# Patient Record
Sex: Female | Born: 1945 | Race: White | Hispanic: No | Marital: Married | Smoking: Never smoker
Health system: Southern US, Community
[De-identification: ages and names within clinical notes are randomized; demographics above are authoritative.]

## PROBLEM LIST (undated history)

## (undated) DIAGNOSIS — I10 Essential (primary) hypertension: Secondary | ICD-10-CM

## (undated) DIAGNOSIS — E119 Type 2 diabetes mellitus without complications: Secondary | ICD-10-CM

## (undated) HISTORY — DX: Essential (primary) hypertension: I10

## (undated) HISTORY — DX: Type 2 diabetes mellitus without complications: E11.9

## (undated) HISTORY — PX: REDUCTION MAMMAPLASTY: SUR839

## (undated) HISTORY — PX: BREAST BIOPSY: SHX20

---

## 2016-10-06 HISTORY — PX: BREAST EXCISIONAL BIOPSY: SUR124

## 2017-12-30 DIAGNOSIS — K2100 Gastro-esophageal reflux disease with esophagitis, without bleeding: Secondary | ICD-10-CM | POA: Diagnosis present

## 2019-07-26 DIAGNOSIS — Z95810 Presence of automatic (implantable) cardiac defibrillator: Secondary | ICD-10-CM | POA: Diagnosis present

## 2019-09-14 ENCOUNTER — Other Ambulatory Visit: Payer: Self-pay | Admitting: Internal Medicine

## 2019-09-14 DIAGNOSIS — N6313 Unspecified lump in the right breast, lower outer quadrant: Secondary | ICD-10-CM

## 2019-09-15 ENCOUNTER — Ambulatory Visit
Admission: RE | Admit: 2019-09-15 | Discharge: 2019-09-15 | Disposition: A | Discharge status: Home / Self Care | Payer: Medicare Other | Source: Ambulatory Visit | Attending: Internal Medicine | Admitting: Internal Medicine

## 2019-09-15 DIAGNOSIS — N6313 Unspecified lump in the right breast, lower outer quadrant: Secondary | ICD-10-CM | POA: Diagnosis present

## 2019-10-31 ENCOUNTER — Other Ambulatory Visit: Payer: Self-pay | Admitting: Internal Medicine

## 2019-10-31 DIAGNOSIS — Z1231 Encounter for screening mammogram for malignant neoplasm of breast: Secondary | ICD-10-CM

## 2019-12-30 DIAGNOSIS — Z7901 Long term (current) use of anticoagulants: Secondary | ICD-10-CM

## 2020-01-24 ENCOUNTER — Ambulatory Visit
Admission: RE | Admit: 2020-01-24 | Discharge: 2020-01-24 | Disposition: A | Discharge status: Home / Self Care | Payer: Medicare Other | Source: Ambulatory Visit | Attending: Internal Medicine | Admitting: Internal Medicine

## 2020-01-24 DIAGNOSIS — Z1231 Encounter for screening mammogram for malignant neoplasm of breast: Secondary | ICD-10-CM | POA: Diagnosis not present

## 2020-05-02 ENCOUNTER — Other Ambulatory Visit: Payer: Self-pay | Admitting: Internal Medicine

## 2020-05-02 DIAGNOSIS — E1169 Type 2 diabetes mellitus with other specified complication: Secondary | ICD-10-CM

## 2020-05-02 DIAGNOSIS — R1032 Left lower quadrant pain: Secondary | ICD-10-CM

## 2020-05-04 ENCOUNTER — Ambulatory Visit
Admission: RE | Admit: 2020-05-04 | Discharge: 2020-05-04 | Disposition: A | Discharge status: Home / Self Care | Payer: Medicare Other | Source: Ambulatory Visit | Attending: Internal Medicine | Admitting: Internal Medicine

## 2020-05-04 ENCOUNTER — Other Ambulatory Visit: Payer: Self-pay

## 2020-05-04 DIAGNOSIS — E1169 Type 2 diabetes mellitus with other specified complication: Secondary | ICD-10-CM

## 2020-05-04 DIAGNOSIS — R1032 Left lower quadrant pain: Secondary | ICD-10-CM | POA: Diagnosis present

## 2020-05-04 MED ORDER — IOHEXOL 300 MG/ML  SOLN
100.0000 mL | Freq: Once | INTRAMUSCULAR | Status: AC | PRN
  Administered 2020-05-04: 100 mL via INTRAVENOUS

## 2020-05-08 ENCOUNTER — Observation Stay
Admission: EM | Admit: 2020-05-08 | Discharge: 2020-05-09 | Disposition: A | Discharge status: Home / Self Care | Payer: Medicare Other | Attending: Internal Medicine | Admitting: Internal Medicine

## 2020-05-08 ENCOUNTER — Other Ambulatory Visit: Payer: Self-pay

## 2020-05-08 DIAGNOSIS — Z20822 Contact with and (suspected) exposure to covid-19: Secondary | ICD-10-CM | POA: Diagnosis not present

## 2020-05-08 DIAGNOSIS — Z7901 Long term (current) use of anticoagulants: Secondary | ICD-10-CM

## 2020-05-08 DIAGNOSIS — I4891 Unspecified atrial fibrillation: Secondary | ICD-10-CM | POA: Insufficient documentation

## 2020-05-08 DIAGNOSIS — T82198A Other mechanical complication of other cardiac electronic device, initial encounter: Principal | ICD-10-CM | POA: Insufficient documentation

## 2020-05-08 DIAGNOSIS — K2100 Gastro-esophageal reflux disease with esophagitis, without bleeding: Secondary | ICD-10-CM | POA: Diagnosis present

## 2020-05-08 DIAGNOSIS — E119 Type 2 diabetes mellitus without complications: Secondary | ICD-10-CM | POA: Diagnosis not present

## 2020-05-08 DIAGNOSIS — R531 Weakness: Secondary | ICD-10-CM | POA: Diagnosis not present

## 2020-05-08 DIAGNOSIS — K219 Gastro-esophageal reflux disease without esophagitis: Secondary | ICD-10-CM | POA: Insufficient documentation

## 2020-05-08 DIAGNOSIS — Z95810 Presence of automatic (implantable) cardiac defibrillator: Secondary | ICD-10-CM | POA: Diagnosis present

## 2020-05-08 DIAGNOSIS — Z4502 Encounter for adjustment and management of automatic implantable cardiac defibrillator: Secondary | ICD-10-CM

## 2020-05-08 DIAGNOSIS — Z79899 Other long term (current) drug therapy: Secondary | ICD-10-CM | POA: Diagnosis not present

## 2020-05-08 DIAGNOSIS — I10 Essential (primary) hypertension: Secondary | ICD-10-CM | POA: Diagnosis not present

## 2020-05-08 LAB — COMPREHENSIVE METABOLIC PANEL
ALT: 28 U/L (ref 0–44)
AST: 29 U/L (ref 15–41)
Albumin: 4 g/dL (ref 3.5–5.0)
Alkaline Phosphatase: 92 U/L (ref 38–126)
Anion gap: 12 (ref 5–15)
BUN: 15 mg/dL (ref 8–23)
CO2: 23 mmol/L (ref 22–32)
Calcium: 8.9 mg/dL (ref 8.9–10.3)
Chloride: 104 mmol/L (ref 98–111)
Creatinine, Ser: 0.95 mg/dL (ref 0.44–1.00)
GFR calc Af Amer: >60 mL/min (ref >60)
GFR calc non Af Amer: 59 mL/min — ABNORMAL LOW (ref >60)
Glucose, Bld: 145 mg/dL — ABNORMAL HIGH (ref 70–99)
Potassium: 3.7 mmol/L (ref 3.5–5.1)
Sodium: 139 mmol/L (ref 135–145)
Total Bilirubin: 0.7 mg/dL (ref 0.3–1.2)
Total Protein: 7.7 g/dL (ref 6.5–8.1)

## 2020-05-08 LAB — CBC WITH DIFFERENTIAL/PLATELET
Abs Immature Granulocytes: 0.04 10*3/uL (ref 0.00–0.07)
Basophils Absolute: 0.1 10*3/uL (ref 0.0–0.1)
Basophils Relative: 1 %
Eosinophils Absolute: 0.2 10*3/uL (ref 0.0–0.5)
Eosinophils Relative: 2 %
HCT: 43 % (ref 36.0–46.0)
Hemoglobin: 13.6 g/dL (ref 12.0–15.0)
Immature Granulocytes: 0 %
Lymphocytes Relative: 11 %
Lymphs Abs: 1 10*3/uL (ref 0.7–4.0)
MCH: 26.5 pg (ref 26.0–34.0)
MCHC: 31.6 g/dL (ref 30.0–36.0)
MCV: 83.7 fL (ref 80.0–100.0)
Monocytes Absolute: 0.6 10*3/uL (ref 0.1–1.0)
Monocytes Relative: 7 %
Neutro Abs: 7.2 10*3/uL (ref 1.7–7.7)
Neutrophils Relative %: 79 %
Platelets: 248 10*3/uL (ref 150–400)
RBC: 5.14 MIL/uL — ABNORMAL HIGH (ref 3.87–5.11)
RDW: 16.3 % — ABNORMAL HIGH (ref 11.5–15.5)
WBC: 9.1 10*3/uL (ref 4.0–10.5)
nRBC: 0 % (ref 0.0–0.2)

## 2020-05-08 LAB — URINALYSIS, COMPLETE (UACMP) WITH MICROSCOPIC
Bacteria, UA: NONE SEEN
Bilirubin Urine: NEGATIVE
Glucose, UA: NEGATIVE mg/dL
Hgb urine dipstick: NEGATIVE
Ketones, ur: NEGATIVE mg/dL
Leukocytes,Ua: NEGATIVE
Nitrite: NEGATIVE
Protein, ur: NEGATIVE mg/dL
Specific Gravity, Urine: 1.004 — ABNORMAL LOW (ref 1.005–1.030)
Squamous Epithelial / HPF: NONE SEEN (ref 0–5)
pH: 7 (ref 5.0–8.0)

## 2020-05-08 LAB — MAGNESIUM: Magnesium: 1.9 mg/dL (ref 1.7–2.4)

## 2020-05-08 LAB — TROPONIN I (HIGH SENSITIVITY): Troponin I (High Sensitivity): 74 ng/L — ABNORMAL HIGH (ref <18)

## 2020-05-08 MED ORDER — SODIUM CHLORIDE 0.9 % IV BOLUS
1000.0000 mL | Freq: Once | INTRAVENOUS | Status: AC
  Administered 2020-05-08: 1000 mL via INTRAVENOUS

## 2020-05-08 NOTE — ED Provider Notes (Signed)
[facility] Emergency Department Provider Note  Time seen: 9:10 PM  I have reviewed the triage vital signs and the nursing notes.   HISTORY  Chief Complaint Weakness   HPI Lindsay [name] is a 74 y.o. female with a past medical history of diabetes, hypertension, presents to the emergency department for her defibrillator going off x6.  According to the patient she was traveling home from the vet with her dogs when she began feeling warm.  Patient states her defibrillator then went off 6 times.  Patient states her defibrillator has not gone off since 2018.  Patient follows up with [place] cardiology.  Patient denies any pain currently.  Denies any pain prior to the discharges.  Currently patient appears well, denies any shortness of breath.  Denies any recent illnesses fever cough or congestion.  Patient did recently start Augmentin 5 days ago for diverticulitis.   Past Medical History:  Diagnosis Date  . Diabetes mellitus without complication ([place])   . Hypertension     There are no problems to display for this patient.   Past Surgical History:  Procedure Laterality Date  . BREAST BIOPSY Right    2019-benign  . REDUCTION MAMMAPLASTY Bilateral    2010    Prior to Admission medications   Not on File    Allergies  Allergen Reactions  . Meloxicam Shortness Of Breath  . Beta Adrenergic Blockers     States severe fatigue   . Lotrel [Amlodipine Besy-Benazepril Hcl]     Severe gingival hyperplasia   . Levofloxacin Rash    Family History  Problem Relation Age of Onset  . Breast cancer Mother     Social History Social History   Tobacco Use  . Smoking status: Never Smoker  . Smokeless tobacco: Never Used  Substance Use Topics  . Alcohol use: Never  . Drug use: Never    Review of Systems Constitutional: Negative for fever Cardiovascular: Negative for chest pain currently.  Patient states 6 defibrillator discharges this evening. Respiratory:  Negative for shortness of breath. Gastrointestinal: Negative for abdominal pain.  Did state blood in her stool last week, had a CT scan was diagnosed with diverticulitis has been on antibiotics for 5 days.  Denies any abdominal pain currently. Genitourinary: Negative for urinary compaints Musculoskeletal: Negative for musculoskeletal complaints Neurological: Negative for headache All other ROS negative  ____________________________________________   PHYSICAL EXAM:  VITAL SIGNS: ED Triage Vitals  Enc Vitals Group     BP 05/08/20 2030 (!) 164/79     Pulse Rate 05/08/20 2030 75     Resp 05/08/20 2030 (!) 25     Temp 05/08/20 2030 98 F (36.7 C)     Temp Source 05/08/20 2030 Oral     SpO2 05/08/20 2030 98 %     Weight 05/08/20 2026 189 lb (85.7 kg)     Height 05/08/20 2026 5\' 5"  (1.651 m)     Head Circumference --      Peak Flow --      Pain Score 05/08/20 2026 0     Pain Loc --      Pain Edu? --      Excl. in [place]? --     Constitutional: Alert and oriented. Well appearing and in no distress. Eyes: Normal exam ENT      Head: Normocephalic and atraumatic.      Mouth/Throat: Mucous membranes are moist. Cardiovascular: Normal rate, regular rhythm.  Respiratory: Normal respiratory effort without tachypnea nor retractions.  Breath sounds are clear Gastrointestinal: Soft and nontender. No distention. Musculoskeletal: Nontender with normal range of motion in all extremities.  Neurologic:  Normal speech and language. No gross focal neurologic deficits  Skin:  Skin is warm, dry and intact.  Psychiatric: Mood and affect are normal.  ____________________________________________    EKG  EKG viewed and interpreted by myself shows a sinus rhythm at 70 bpm.  Widened QRS, normal axis, slight QTC prolongation.  Occasional PVCs.  No significant ST changes noted.  ____________________________________________   INITIAL IMPRESSION / ASSESSMENT AND PLAN / ED COURSE  Pertinent labs &  imaging results that were available during my care of the patient were reviewed by me and considered in my medical decision making (see chart for details).   Patient presents to the emergency department after defibrillator discharge 6 times this evening.  Overall patient appears well, no distress currently.  Patient appears to be in a sinus rhythm at this time.  We will check labs including potassium, magnesium.  We will IV hydrate.  We will interrogate the patient's defibrillator and continue to closely monitor.  Patient agreeable to plan of care.  Lindsay [name] was evaluated in Emergency Department on 05/08/2020 for the symptoms described in the history of present illness. She was evaluated in the context of the global COVID-19 pandemic, which necessitated consideration that the patient might be at risk for infection with the SARS-CoV-2 virus that causes COVID-19. Institutional protocols and algorithms that pertain to the evaluation of patients at risk for COVID-19 are in a state of rapid change based on information released by regulatory bodies including the CDC and federal and state organizations. These policies and algorithms were followed during the patient's care in the ED.  ____________________________________________   FINAL CLINICAL IMPRESSION(S) / ED DIAGNOSES  Defibrillator discharge   Harvest Dark, MD 05/09/20 [phone]

## 2020-05-08 NOTE — ED Triage Notes (Signed)
Pt arrives [facility] from home w cc of generalized weakness starting at 6:30pm after defibrillator went off 6 times. Settings meant to go off for HR >180. Pt also has pacemaker. Pt denies pain, shob, n/v/d. PT HR 180s to 210s upon ems arrival. 18G R AC given 10mg  total cardizem IV.  Hx afib

## 2020-05-08 NOTE — ED Notes (Signed)
Pt taking tikosyn 257mcg, metoprolol 25 mg, and losartan 25mg  from home supply. Okay per MD Paduchowski. Pt med list at bedside.

## 2020-05-09 ENCOUNTER — Encounter: Payer: Self-pay | Admitting: Internal Medicine

## 2020-05-09 DIAGNOSIS — E785 Hyperlipidemia, unspecified: Secondary | ICD-10-CM

## 2020-05-09 DIAGNOSIS — Z95810 Presence of automatic (implantable) cardiac defibrillator: Secondary | ICD-10-CM | POA: Diagnosis not present

## 2020-05-09 DIAGNOSIS — I4891 Unspecified atrial fibrillation: Secondary | ICD-10-CM | POA: Diagnosis not present

## 2020-05-09 DIAGNOSIS — I10 Essential (primary) hypertension: Secondary | ICD-10-CM | POA: Diagnosis not present

## 2020-05-09 DIAGNOSIS — Z4502 Encounter for adjustment and management of automatic implantable cardiac defibrillator: Secondary | ICD-10-CM

## 2020-05-09 DIAGNOSIS — T82198A Other mechanical complication of other cardiac electronic device, initial encounter: Secondary | ICD-10-CM | POA: Diagnosis not present

## 2020-05-09 LAB — BASIC METABOLIC PANEL
Anion gap: 7 (ref 5–15)
BUN: 13 mg/dL (ref 8–23)
CO2: 27 mmol/L (ref 22–32)
Calcium: 8.4 mg/dL — ABNORMAL LOW (ref 8.9–10.3)
Chloride: 106 mmol/L (ref 98–111)
Creatinine, Ser: 0.91 mg/dL (ref 0.44–1.00)
GFR calc Af Amer: >60 mL/min (ref >60)
GFR calc non Af Amer: >60 mL/min (ref >60)
Glucose, Bld: 179 mg/dL — ABNORMAL HIGH (ref 70–99)
Potassium: 4 mmol/L (ref 3.5–5.1)
Sodium: 140 mmol/L (ref 135–145)

## 2020-05-09 LAB — TROPONIN I (HIGH SENSITIVITY): Troponin I (High Sensitivity): 216 ng/L (ref <18)

## 2020-05-09 LAB — MAGNESIUM: Magnesium: 2 mg/dL (ref 1.7–2.4)

## 2020-05-09 LAB — SARS CORONAVIRUS 2 BY RT PCR (HOSPITAL ORDER, PERFORMED IN ~~LOC~~ HOSPITAL LAB): SARS Coronavirus 2: NEGATIVE

## 2020-05-09 MED ORDER — AMOXICILLIN-POT CLAVULANATE 875-125 MG PO TABS
1.0000 | ORAL_TABLET | Freq: Two times a day (BID) | ORAL | Status: DC
  Administered 2020-05-09: 1 via ORAL
  Filled 2020-05-09 (×2): qty 1

## 2020-05-09 MED ORDER — METOPROLOL TARTRATE 50 MG PO TABS
50.0000 mg | ORAL_TABLET | Freq: Two times a day (BID) | ORAL | Status: DC
  Administered 2020-05-09: 50 mg via ORAL
  Filled 2020-05-09: qty 1

## 2020-05-09 MED ORDER — ACETAMINOPHEN 325 MG PO TABS: 650.0000 mg | ORAL_TABLET | Freq: Four times a day (QID) | ORAL | Status: DC | PRN

## 2020-05-09 MED ORDER — METOPROLOL TARTRATE 50 MG PO TABS: 50.0000 mg | ORAL_TABLET | Freq: Three times a day (TID) | ORAL | 0 refills | Status: DC

## 2020-05-09 MED ORDER — METOPROLOL TARTRATE 50 MG PO TABS
50.0000 mg | ORAL_TABLET | Freq: Three times a day (TID) | ORAL | Status: DC
  Administered 2020-05-09: 50 mg via ORAL
  Filled 2020-05-09: qty 1

## 2020-05-09 MED ORDER — DOFETILIDE 250 MCG PO CAPS
250.0000 ug | ORAL_CAPSULE | Freq: Two times a day (BID) | ORAL | Status: DC
  Administered 2020-05-09: 250 ug via ORAL
  Filled 2020-05-09 (×2): qty 1

## 2020-05-09 MED ORDER — LOSARTAN POTASSIUM 50 MG PO TABS
50.0000 mg | ORAL_TABLET | Freq: Two times a day (BID) | ORAL | Status: DC
  Administered 2020-05-09: 50 mg via ORAL
  Filled 2020-05-09: qty 1

## 2020-05-09 MED ORDER — MAGNESIUM OXIDE 400 (241.3 MG) MG PO TABS: 400.0000 mg | ORAL_TABLET | Freq: Two times a day (BID) | ORAL | Status: DC

## 2020-05-09 MED ORDER — MAGNESIUM OXIDE 400 (241.3 MG) MG PO TABS: 800.0000 mg | ORAL_TABLET | Freq: Once | ORAL | Status: DC

## 2020-05-09 MED ORDER — ONDANSETRON HCL 4 MG/2ML IJ SOLN: 4.0000 mg | Freq: Four times a day (QID) | INTRAMUSCULAR | Status: DC | PRN

## 2020-05-09 MED ORDER — ROSUVASTATIN CALCIUM 10 MG PO TABS
10.0000 mg | ORAL_TABLET | Freq: Every day | ORAL | Status: DC
  Administered 2020-05-09: 10 mg via ORAL

## 2020-05-09 MED ORDER — ONDANSETRON HCL 4 MG PO TABS: 4.0000 mg | ORAL_TABLET | Freq: Four times a day (QID) | ORAL | Status: DC | PRN

## 2020-05-09 MED ORDER — ACETAMINOPHEN 650 MG RE SUPP: 650.0000 mg | Freq: Four times a day (QID) | RECTAL | Status: DC | PRN

## 2020-05-09 MED ORDER — RIVAROXABAN 20 MG PO TABS: 20.0000 mg | ORAL_TABLET | Freq: Every day | ORAL | Status: DC

## 2020-05-09 NOTE — ED Provider Notes (Signed)
-----------------------------------------   12:06 AM on 05/09/2020 -----------------------------------------  Assuming care from Dr. Kerman Passey.  In short, Lindsay [name] is a 74 y.o. female with a chief complaint of pacemaker firing.  Refer to the original H&P for additional details.  Patient will be admitted by hospitalist at [facility]  However, we are awaiting a callback from [facility] for recommendations.  Need recommendations given that she received 6 shocks but the pacemaker cannot be interrogated.  Patient states that at home she was able to submit the interrogation.  Patient is already on Tikosyn.     ----------------------------------------- 12:55 AM on 05/09/2020 -----------------------------------------   (Note that documentation was delayed due to multiple ED patients requiring immediate care.)   Dr. Kerman Passey discussed the case with the patient and with Dr. Damita Dunnings with the [facility] hospitalist service.  The patient will be staying here overnight.  I spoke by phone with Dr. Rosemarie Ax with [facility] cardiology.  He understands the situation and advised monitoring overnight, continuing her regular medications, and the use of beta-blockers and/or calcium channel blockers as needed to keep her heart rate appropriate.  Right now she is on the cardiac monitor with a heart rate of about 70.  I discussed the case again with Dr. Damita Dunnings and she will admit.   Hinda Kehr, MD 05/09/20 [phone]

## 2020-05-09 NOTE — Plan of Care (Signed)
Patient refusing bed alarm, educated multiple times on the importance of calling before getting up to the bathroom since her ICD fired and we would like to prevent her from falling if her ICD were to fire again.  During shift change report, pt stated she had her own meds in her room, and that she will take her own meds if we do not bring in her meds at the times she takes it. Refused to have meds sent to pharmacy. Pt also made aware that per policy she can not take her own meds because if something were to happen we would need to know what meds she's taken but we will bring meds at the times she want them. Pt adamant about sending meds to pharmacy. Charge RN aware.  Problem: Activity: Goal: Risk for activity intolerance will decrease Outcome: Not Progressing   Problem: Education: Goal: Knowledge of cardiac device and self-care will improve Outcome: Progressing Goal: Ability to safely manage health related needs after discharge will improve Outcome: Progressing Goal: Individualized Educational Video(s) Outcome: Progressing

## 2020-05-09 NOTE — H&P (Addendum)
History and Physical    Lindsay [name] GDJ:[id number] DOB: [birth year] DOA: 05/08/2020  PCP: Ezequiel Kayser, MD   Patient coming from: home  I have personally briefly reviewed patient's old medical records in [place]  Chief Complaint: Defibrillator discharge  HPI: Lindsay [name] is a 74 y.o. female with medical history significant for A. fib status post ablation in 2017, on Xarelto , history of atrial tachycardia status post ICD, currently on dofetilide, status post dosage reduction in August 2020 due to prolonged QT concerns, followed by electrophysiologist, Dr. Mylinda Latina at [facility], who presents to the emergency room following defibrillator discharge x6.  Patient states her defibrillator is meant to go off her heart rate over 180.  On arrival of EMS heart rate was in the 180s to 210s.  She received Cardizem 10 mg IV with EMS.  Patient had previously been in her usual state of health except for recent acute diverticulitis for which she had been on Augmentin in the previous 5 days. ED Course: By arrival, patient  felt weak but denied chest pains or shortness of breath.  EKG showed sinus rhythm in the 70s with frequent PVCs.  Blood work showed troponin 74.  Magnesium 1.9, potassium 3.7.  The emergency room provider spoke with patient's cardiologist at [facility] who recommended overnight observation with as needed metoprolol or diltiazem for tachyarrhythmias.  Patient initially requested transfer to [facility] but was agreeable to staying for overnight observation.  Hospitalist consulted for admission.  Review of Systems: As per HPI otherwise all other systems on review of systems negative.    Past Medical History:  Diagnosis Date  . Diabetes mellitus without complication ([place])   . Hypertension     Past Surgical History:  Procedure Laterality Date  . BREAST BIOPSY Right    2019-benign  . REDUCTION MAMMAPLASTY Bilateral    2010     reports that she has never smoked. She has never used smokeless  tobacco. She reports that she does not drink alcohol and does not use drugs.  Allergies  Allergen Reactions  . Meloxicam Shortness Of Breath  . Beta Adrenergic Blockers     States severe fatigue   . Lotrel [Amlodipine Besy-Benazepril Hcl]     Severe gingival hyperplasia   . Levofloxacin Rash    Family History  Problem Relation Age of Onset  . Breast cancer Mother       Prior to Admission medications   Medication Sig Start Date End Date Taking? Authorizing Provider  beta carotene w/minerals (OCUVITE) tablet Take 1 tablet by mouth daily. 01/26/20  Yes [provider]  dofetilide (TIKOSYN) 250 MCG capsule Take 250 mcg by mouth 2 (two) times daily. 03/05/20  Yes [provider]  losartan (COZAAR) 50 MG tablet Take 50 mg by mouth 2 (two) times daily. 04/30/20  Yes [provider]  MAGNESIUM OXIDE PO Take 165 mg by mouth daily.   Yes [provider]  metoprolol tartrate (LOPRESSOR) 50 MG tablet Take 50 mg by mouth 2 (two) times daily. 04/30/20  Yes [provider]  Multiple Vitamins tablet Take 1 tablet by mouth daily. 01/26/20  Yes [provider]  NEXIUM 20 MG capsule Take 1 capsule by mouth daily as needed. 01/26/20  Yes [provider]  rosuvastatin (CRESTOR) 10 MG tablet Take 10 mg by mouth at bedtime. 04/28/20  Yes [provider]  XARELTO 20 MG TABS tablet Take 20 mg by mouth daily. 04/28/20  Yes [provider]  Physical Exam: Vitals:   05/08/20 2130 05/08/20 2318 05/08/20 2330 05/09/20 0006  BP: (!) 177/80 (!) 152/66 (!) 163/56 (!) 169/67  Pulse: 70 70 70 70  Resp: (!) 24 14 19  (!) 21  Temp:      TempSrc:      SpO2: 100% 98% 98% 97%  Weight:      Height:         Vitals:   05/08/20 2130 05/08/20 2318 05/08/20 2330 05/09/20 0006  BP: (!) 177/80 (!) 152/66 (!) 163/56 (!) 169/67  Pulse: 70 70 70 70  Resp: (!) 24 14 19  (!) 21  Temp:      TempSrc:      SpO2: 100% 98% 98% 97%  Weight:        Height:          Constitutional: Alert and oriented x 3 . Not in any apparent distress HEENT:      Head: Normocephalic and atraumatic.         Eyes: PERLA, EOMI, Conjunctivae are normal. Sclera is non-icteric.       Mouth/Throat: Mucous membranes are moist.       Neck: Supple with no signs of meningismus. Cardiovascular: Regular rate and rhythm. No murmurs, gallops, or rubs. 2+ symmetrical distal pulses are present . No JVD. No LE edema Respiratory: Respiratory effort normal .Lungs sounds clear bilaterally. No wheezes, crackles, or rhonchi.  Gastrointestinal: Soft, non tender, and non distended with positive bowel sounds. No rebound or guarding. Genitourinary: No CVA tenderness. Musculoskeletal: Nontender with normal range of motion in all extremities. No cyanosis, or erythema of extremities. Neurologic: Normal speech and language. Face is symmetric. Moving all extremities. No gross focal neurologic deficits . Skin: Skin is warm, dry.  No rash or ulcers Psychiatric: Mood and affect are normal Speech and behavior are normal   Labs on Admission: I have personally reviewed following labs and imaging studies  CBC: Recent Labs  Lab 05/08/20 2040  WBC 9.1  NEUTROABS 7.2  HGB 13.6  HCT 43.0  MCV 83.7  PLT 096   Basic Metabolic Panel: Recent Labs  Lab 05/08/20 2040  NA 139  K 3.7  CL 104  CO2 23  GLUCOSE 145*  BUN 15  CREATININE 0.95  CALCIUM 8.9  MG 1.9   GFR: Estimated Creatinine Clearance: 56.2 mL/min (by C-G formula based on SCr of 0.95 mg/dL). Liver Function Tests: Recent Labs  Lab 05/08/20 2040  AST 29  ALT 28  ALKPHOS 92  BILITOT 0.7  PROT 7.7  ALBUMIN 4.0   No results for input(s): LIPASE, AMYLASE in the last 168 hours. No results for input(s): AMMONIA in the last 168 hours. Coagulation Profile: No results for input(s): INR, PROTIME in the last 168 hours. Cardiac Enzymes: No results for input(s): CKTOTAL, CKMB, CKMBINDEX, TROPONINI in the last 168  hours. BNP (last 3 results) No results for input(s): PROBNP in the last 8760 hours. HbA1C: No results for input(s): HGBA1C in the last 72 hours. CBG: No results for input(s): GLUCAP in the last 168 hours. Lipid Profile: No results for input(s): CHOL, HDL, LDLCALC, TRIG, CHOLHDL, LDLDIRECT in the last 72 hours. Thyroid Function Tests: No results for input(s): TSH, T4TOTAL, FREET4, T3FREE, THYROIDAB in the last 72 hours. Anemia Panel: No results for input(s): VITAMINB12, FOLATE, FERRITIN, TIBC, IRON, RETICCTPCT in the last 72 hours. Urine analysis:    Component Value Date/Time   COLORURINE COLORLESS (A) 05/08/2020 2041   APPEARANCEUR CLEAR (A) 05/08/2020 2041  LABSPEC 1.004 (L) 05/08/2020 2041   PHURINE 7.0 05/08/2020 2041   GLUCOSEU NEGATIVE 05/08/2020 2041   HGBUR NEGATIVE 05/08/2020 2041   [place] NEGATIVE 05/08/2020 2041   [place] NEGATIVE 05/08/2020 2041   PROTEINUR NEGATIVE 05/08/2020 2041   NITRITE NEGATIVE 05/08/2020 2041   LEUKOCYTESUR NEGATIVE 05/08/2020 2041    Radiological Exams on Admission: No results found.  EKG: Independently reviewed. Interpretation : Sinus rhythm rate of 70 with PVCs  Assessment/Plan 74 year old female with a history of A. fib status post ablation in 2017, on Xarelto , history of atrial tachycardia status post ICD, currently on dofetilide, status post dosage reduction in August 2020 due to prolonged QT concerns, followed by electrophysiologist, Dr. Mylinda Latina at [facility], who presents to the emergency room following defibrillator discharge x6    Defibrillator discharge   ICD (implantable cardioverter-defibrillator), dual, in situ -Heart rate 180s to 210s with EMS treated with Cardizem bolus x1 -Heart rate remained in sinus in the 70s with PVCs since arrival in the emergency room -Unable to interrogate ICD from the emergency room, reason uncertain -The emergency room provider spoke with on-call electrophysiologist at [facility] who advised to  monitor patient overnight with as needed metoprolol or diltiazem for tachyarrhythmia -Continue home dofetilide and metoprolol -Keep magnesium over 2, potassium around 4    Atrial fibrillation ([place]) with history of ablation in 2017   Anticoagulated -Continue rate control agents above, dofetilide and metoprolol -Continue Xarelto for CHA2DS2-VASc score of 4 -   Gastro-esophageal reflux disease  -Continue home Nexium    DVT prophylaxis: Xarelto Code Status: full code  Family Communication:  none  Disposition Plan: Back to previous home environment Consults called: Cardiology Status: Observation    Athena Masse MD [facility]     05/09/2020, 1:12 AM

## 2020-05-09 NOTE — Progress Notes (Signed)
Lindsay [name] to be D/C'd Home per MD order.  Discussed prescriptions and follow up appointments with the patient. Prescriptions given to patient, medication list explained in detail. Pt verbalized understanding.  Allergies as of 05/09/2020      Reactions   Meloxicam Shortness Of Breath   Beta Adrenergic Blockers    States severe fatigue    Lotrel [amlodipine Besy-benazepril Hcl]    Severe gingival hyperplasia    Levofloxacin Rash      Medication List    TAKE these medications   amoxicillin-clavulanate 875-125 MG tablet Commonly known as: AUGMENTIN Take 1 tablet by mouth 2 (two) times daily.   beta carotene w/minerals tablet Take 1 tablet by mouth daily.   dofetilide 250 MCG capsule Commonly known as: TIKOSYN Take 250 mcg by mouth 2 (two) times daily.   losartan 50 MG tablet Commonly known as: COZAAR Take 50 mg by mouth 2 (two) times daily.   MAGNESIUM OXIDE PO Take 165 mg by mouth daily.   metoprolol tartrate 50 MG tablet Commonly known as: LOPRESSOR Take 1 tablet (50 mg total) by mouth 3 (three) times daily. What changed: when to take this   Multiple Vitamins tablet Take 1 tablet by mouth daily.   NexIUM 20 MG capsule Generic drug: esomeprazole Take 1 capsule by mouth daily as needed.   rosuvastatin 10 MG tablet Commonly known as: CRESTOR Take 10 mg by mouth at bedtime.   Xarelto 20 MG Tabs tablet Generic drug: rivaroxaban Take 20 mg by mouth daily.       Vitals:   05/09/20 0934 05/09/20 1151  BP:  (!) 146/65  Pulse:  70  Resp:    Temp:  98.1 F (36.7 C)  SpO2: 97% 97%    Tele box removed and returned. Skin clean, dry and intact without evidence of skin break down, no evidence of skin tears noted. IV catheter discontinued intact. Site without signs and symptoms of complications. Dressing and pressure applied. Pt denies pain at this time. No complaints noted.  An After Visit Summary was printed and given to the patient. Patient escorted via [place], and  D/C home via private auto.  Lindsay [name]

## 2020-05-09 NOTE — Discharge Summary (Signed)
Physician Discharge Summary  Lindsay [name] ZTI:[id number] DOB: 01/01/1946 DOA: 05/08/2020  PCP: Ezequiel Kayser, MD  Admit date: 05/08/2020 Discharge date: 05/09/2020  Discharge disposition: Home   Recommendations for Outpatient Follow-Up:   Cardiologist, Dr. Mylinda Latina on May 15, 2020    Discharge Diagnosis:   Principal Problem:   Defibrillator discharge Active Problems:   Atrial fibrillation ([place])   Anticoagulated   Gastro-esophageal reflux disease with esophagitis   ICD (implantable cardioverter-defibrillator), dual, in situ    Discharge Condition: Stable.  Diet recommendation:  Diet Order            Diet Heart Room service appropriate? Yes; Fluid consistency: Thin  Diet effective now           Diet - low sodium heart healthy                   Code Status: Full Code     Hospital Course:   Ms. Lindsay [name] is a 74 y.o. female with medical history significant for recent acute diverticulitis on Augmentin, A. fib status post ablation in 2017, on Xarelto , history of atrial tachycardia status post ICD, currently on dofetilide, status post dosage reduction in August 2020 due to prolonged QT concerns.  She was followed by electrophysiologist, Dr. Mylinda Latina at [facility].  Patient emergency room after she had AICD discharge x6.  She was admitted to progressive cardiac unit for observation.  AICD interrogation showed that atrial tachycardia and nonsustained ventricular tachycardia were the cause of the shocks.  She was evaluated by the cardiologist who recommended increasing metoprolol from 50 mg twice daily to 3 times daily.  There were no further ICD shocks understanding.  She is asymptomatic and she is deemed stable for discharge.  Discharge plan was discussed with the patient and she verbalized understanding and agreed with the plan.       Discharge Exam:   Vitals:   05/09/20 0934 05/09/20 1151  BP:  (!) 146/65  Pulse:  70  Resp:    Temp:  98.1 F (36.7 C)   SpO2: 97% 97%   Vitals:   05/09/20 0443 05/09/20 0744 05/09/20 0934 05/09/20 1151  BP: (!) 158/68 (!) 168/72  (!) 146/65  Pulse: 69 70  70  Resp:      Temp: 97.7 F (36.5 C) 97.8 F (36.6 C)  98.1 F (36.7 C)  TempSrc: Oral Oral  Oral  SpO2: 98% 98% 97% 97%  Weight: 84.3 kg     Height: 5\' 5"  (1.651 m)        GEN: NAD SKIN: No rash EYES: EOMI ENT: MMM CV: RRR PULM: CTA B ABD: soft, obese, NT, +BS CNS: AAO x 3, non focal EXT: No edema or tenderness   The results of significant diagnostics from this hospitalization (including imaging, microbiology, ancillary and laboratory) are listed below for reference.     Procedures and Diagnostic Studies:   No results found.   Labs:   Basic Metabolic Panel: Recent Labs  Lab 05/08/20 2040 05/09/20 0233  NA 139 140  K 3.7 4.0  CL 104 106  CO2 23 27  GLUCOSE 145* 179*  BUN 15 13  CREATININE 0.95 0.91  CALCIUM 8.9 8.4*  MG 1.9 2.0   GFR Estimated Creatinine Clearance: 58.1 mL/min (by C-G formula based on SCr of 0.91 mg/dL). Liver Function Tests: Recent Labs  Lab 05/08/20 2040  AST 29  ALT 28  ALKPHOS 92  BILITOT 0.7  PROT 7.7  ALBUMIN  4.0   No results for input(s): LIPASE, AMYLASE in the last 168 hours. No results for input(s): AMMONIA in the last 168 hours. Coagulation profile No results for input(s): INR, PROTIME in the last 168 hours.  CBC: Recent Labs  Lab 05/08/20 2040  WBC 9.1  NEUTROABS 7.2  HGB 13.6  HCT 43.0  MCV 83.7  PLT 248   Cardiac Enzymes: No results for input(s): CKTOTAL, CKMB, CKMBINDEX, TROPONINI in the last 168 hours. BNP: Invalid input(s): POCBNP CBG: No results for input(s): GLUCAP in the last 168 hours. D-Dimer No results for input(s): DDIMER in the last 72 hours. Hgb A1c No results for input(s): HGBA1C in the last 72 hours. Lipid Profile No results for input(s): CHOL, HDL, LDLCALC, TRIG, CHOLHDL, LDLDIRECT in the last 72 hours. Thyroid function studies No results  for input(s): TSH, T4TOTAL, T3FREE, THYROIDAB in the last 72 hours.  Invalid input(s): FREET3 Anemia work up No results for input(s): VITAMINB12, FOLATE, FERRITIN, TIBC, IRON, RETICCTPCT in the last 72 hours. Microbiology Recent Results (from the past 240 hour(s))  SARS Coronavirus 2 by RT PCR (hospital order, performed in [facility] hospital lab) Nasopharyngeal Nasopharyngeal Swab     Status: None   Collection Time: 05/09/20 12:15 AM   Specimen: Nasopharyngeal Swab  Result Value Ref Range Status   SARS Coronavirus 2 NEGATIVE NEGATIVE Final    Comment: (NOTE) SARS-CoV-2 target nucleic acids are NOT DETECTED.  The SARS-CoV-2 RNA is generally detectable in upper and lower respiratory specimens during the acute phase of infection. The lowest concentration of SARS-CoV-2 viral copies this assay can detect is 250 copies / mL. A negative result does not preclude SARS-CoV-2 infection and should not be used as the sole basis for treatment or other patient management decisions.  A negative result may occur with improper specimen collection / handling, submission of specimen other than nasopharyngeal swab, presence of viral mutation(s) within the areas targeted by this assay, and inadequate number of viral copies (<250 copies / mL). A negative result must be combined with clinical observations, patient history, and epidemiological information.  Fact Sheet for Patients:   StrictlyIdeas.no  Fact Sheet for Healthcare Providers: BankingDealers.co.za  This test is not yet approved or  cleared by the [place] FDA and has been authorized for detection and/or diagnosis of SARS-CoV-2 by FDA under an Emergency Use Authorization (EUA).  This EUA will remain in effect (meaning this test can be used) for the duration of the COVID-19 declaration under Section 564(b)(1) of the Act, 21 U.S.C. section 360bbb-3(b)(1), unless the authorization is terminated  or revoked sooner.  Performed at [facility], [address] [place], [place] [address]      Discharge Instructions:   Discharge Instructions    Diet - low sodium heart healthy   Complete by: As directed    Increase activity slowly   Complete by: As directed      Allergies as of 05/09/2020      Reactions   Meloxicam Shortness Of Breath   Beta Adrenergic Blockers    States severe fatigue    Lotrel [amlodipine Besy-benazepril Hcl]    Severe gingival hyperplasia    Levofloxacin Rash      Medication List    TAKE these medications   amoxicillin-clavulanate 875-125 MG tablet Commonly known as: AUGMENTIN Take 1 tablet by mouth 2 (two) times daily.   beta carotene w/minerals tablet Take 1 tablet by mouth daily.   dofetilide 250 MCG capsule Commonly known as: TIKOSYN Take  250 mcg by mouth 2 (two) times daily.   losartan 50 MG tablet Commonly known as: COZAAR Take 50 mg by mouth 2 (two) times daily.   MAGNESIUM OXIDE PO Take 165 mg by mouth daily.   metoprolol tartrate 50 MG tablet Commonly known as: LOPRESSOR Take 1 tablet (50 mg total) by mouth 3 (three) times daily. What changed: when to take this   Multiple Vitamins tablet Take 1 tablet by mouth daily.   NexIUM 20 MG capsule Generic drug: esomeprazole Take 1 capsule by mouth daily as needed.   rosuvastatin 10 MG tablet Commonly known as: CRESTOR Take 10 mg by mouth at bedtime.   Xarelto 20 MG Tabs tablet Generic drug: rivaroxaban Take 20 mg by mouth daily.       Follow-up Information    Marzetta Board, MD. Go on 05/15/2020.   Specialty: Cardiology Contact information: [facility] [address] [place] [address] [phone]                Time coordinating discharge: 31 minutes  Signed:  Jennye Boroughs  [facility] 05/09/2020, 2:16 PM

## 2020-05-09 NOTE — Consult Note (Signed)
Cardiology Consultation:   Patient ID: Lindsay [name] MRN: [id number]; DOB: 1945/12/13  Admit date: 05/08/2020 Date of Consult: 05/09/2020  Primary Care Provider: Ezequiel Kayser, MD [facility] Cardiologist: [facility] Electrophysiologist:  [facility]   Patient Profile:   Lindsay [name] is a 74 y.o. female with a hx of hypertension, diabetes, HCM status post AICD, A. fib status post ablation who is being seen today for the evaluation of arrhythmias, ICD shock at the request of Dr. Damita Dunnings.  History of Present Illness:   [name] patient is a 74 year old female with history of HCM status post ICD for primary prevention, paroxysmal atrial fibrillation on Xarelto, status post cryoablation in 2017, hypertension who presents due to ICD shocks.  Patient was holding her dog when she fell her heart rate beating fast, felt warm and her device shocked her 6 times.  She called EMS who evaluated her roughly 10 to 15 minutes later.  Patient was noted to be in atrial tachycardia upon EMS arrival.  She was given IV diltiazem with improvement in heart rates en route to the emergency department.  She states having similar symptoms in 2018 due to atrial tachycardia, was started on Tikosyn 500 twice daily.  She had QT prolongation in December 2020 and Tikosyn dose was reduced to 250 mg twice daily.  Last ejection fraction in June 2020 was 60%.  Patient has a history of gingivitis while on calcium channel blocker/amlodipine.  She otherwise was doing okay, denies shortness of breath or chest pain, apart from the ICD shocks which were very painful.    The emergency department discussed with [facility] cardiology who recommended calcium channel blockers and beta-blockers to help with patient's heart rate.  Device was interrogated in the ED showing atrial tachycardia, nonsustained VT.  EKG showed sinus rhythm with PVCs.  Patient called primary cardiologist this morning and has appointment scheduled in 6  days.   Past Medical History:  Diagnosis Date  . Diabetes mellitus without complication ([place])   . Hypertension     Past Surgical History:  Procedure Laterality Date  . BREAST BIOPSY Right    2019-benign  . REDUCTION MAMMAPLASTY Bilateral    2010     Home Medications:  Prior to Admission medications   Medication Sig Start Date End Date Taking? Authorizing Provider  amoxicillin-clavulanate (AUGMENTIN) 875-125 MG tablet Take 1 tablet by mouth 2 (two) times daily.   Yes [provider]  beta carotene w/minerals (OCUVITE) tablet Take 1 tablet by mouth daily. 01/26/20  Yes [provider]  dofetilide (TIKOSYN) 250 MCG capsule Take 250 mcg by mouth 2 (two) times daily. 03/05/20  Yes [provider]  losartan (COZAAR) 50 MG tablet Take 50 mg by mouth 2 (two) times daily. 04/30/20  Yes [provider]  MAGNESIUM OXIDE PO Take 165 mg by mouth daily.   Yes [provider]  metoprolol tartrate (LOPRESSOR) 50 MG tablet Take 50 mg by mouth 2 (two) times daily. 04/30/20  Yes [provider]  Multiple Vitamins tablet Take 1 tablet by mouth daily. 01/26/20  Yes [provider]  NEXIUM 20 MG capsule Take 1 capsule by mouth daily as needed. 01/26/20  Yes [provider]  rosuvastatin (CRESTOR) 10 MG tablet Take 10 mg by mouth at bedtime. 04/28/20  Yes [provider]  XARELTO 20 MG TABS tablet Take 20 mg by mouth daily. 04/28/20  Yes [provider]    Inpatient Medications: Scheduled Meds: . amoxicillin-clavulanate  1 tablet  Oral BID  . dofetilide  250 mcg Oral BID  . losartan  50 mg Oral BID  . magnesium oxide  400 mg Oral BID  . magnesium oxide  800 mg Oral Once  . metoprolol tartrate  50 mg Oral TID  . rivaroxaban  20 mg Oral Q supper  . rosuvastatin  10 mg Oral q1800   Continuous Infusions:  PRN Meds: acetaminophen **OR** acetaminophen, ondansetron **OR** ondansetron (ZOFRAN) IV  Allergies:     Allergies  Allergen Reactions  . Meloxicam Shortness Of Breath  . Beta Adrenergic Blockers     States severe fatigue   . Lotrel [Amlodipine Besy-Benazepril Hcl]     Severe gingival hyperplasia   . Levofloxacin Rash    Social History:   Social History   Socioeconomic History  . Marital status: Unknown    Spouse name: Not on file  . Number of children: Not on file  . Years of education: Not on file  . Highest education level: Not on file  Occupational History  . Not on file  Tobacco Use  . Smoking status: Never Smoker  . Smokeless tobacco: Never Used  Substance and Sexual Activity  . Alcohol use: Never  . Drug use: Never  . Sexual activity: Not Currently  Other Topics Concern  . Not on file  Social History Narrative  . Not on file   Social Determinants of Health   Financial Resource Strain:   . Difficulty of Paying Living Expenses:   Food Insecurity:   . Worried About Charity fundraiser in the Last Year:   . Arboriculturist in the Last Year:   Transportation Needs:   . Film/video editor (Medical):   Marland Kitchen Lack of Transportation (Non-Medical):   Physical Activity:   . Days of Exercise per Week:   . Minutes of Exercise per Session:   Stress:   . Feeling of Stress :   Social Connections:   . Frequency of Communication with Friends and Family:   . Frequency of Social Gatherings with Friends and Family:   . Attends Religious Services:   . Active Member of Clubs or Organizations:   . Attends Archivist Meetings:   Marland Kitchen Marital Status:   Intimate Partner Violence:   . Fear of Current or Ex-Partner:   . Emotionally Abused:   Marland Kitchen Physically Abused:   . Sexually Abused:     Family History:    Family History  Problem Relation Age of Onset  . Breast cancer Mother      ROS:  Please see the history of present illness.   All other ROS reviewed and negative.     Physical Exam/Data:   Vitals:   05/09/20 0330 05/09/20 0443 05/09/20 0744 05/09/20 0934   BP: (!) 174/70 (!) 158/68 (!) 168/72   Pulse: 69 69 70   Resp: 20     Temp:  97.7 F (36.5 C) 97.8 F (36.6 C)   TempSrc:  Oral Oral   SpO2: 98% 98% 98% 97%  Weight:  84.3 kg    Height:  5\' 5"  (1.651 m)      Intake/Output Summary (Last 24 hours) at 05/09/2020 1135 Last data filed at 05/09/2020 0744 Gross per 24 hour  Intake 1000 ml  Output 200 ml  Net 800 ml   Last 3 Weights 05/09/2020 05/08/2020  Weight (lbs) 185 lb 14.4 oz 189 lb  Weight (kg) 84.324 kg 85.73 kg     Body mass index  is 30.94 kg/m.  General:  Well nourished, well developed, in no acute distress HEENT: normal Lymph: no adenopathy Neck: no JVD Endocrine:  No thryomegaly Vascular: No carotid bruits; FA pulses 2+ bilaterally without bruits  Cardiac:  normal S1, S2; RRR; no murmur  Lungs:  clear to auscultation bilaterally, no wheezing, rhonchi or rales  Abd: soft, nontender, no hepatomegaly  Ext: no edema Musculoskeletal:  No deformities, BUE and BLE strength normal and equal Skin: warm and dry  Neuro:  CNs 2-12 intact, no focal abnormalities noted Psych:  Normal affect   EKG:  The EKG was personally reviewed and demonstrates: Sinus rhythm, PVCs Telemetry:  Telemetry was personally reviewed and demonstrates: Atrial paced rhythm, 70   Laboratory Data:  High Sensitivity Troponin:   Recent Labs  Lab 05/08/20 2040 05/09/20 0015  TROPONINIHS 74* 216*     Chemistry Recent Labs  Lab 05/08/20 2040 05/09/20 0233  NA 139 140  K 3.7 4.0  CL 104 106  CO2 23 27  GLUCOSE 145* 179*  BUN 15 13  CREATININE 0.95 0.91  CALCIUM 8.9 8.4*  GFRNONAA 59* >60  GFRAA >60 >60  ANIONGAP 12 7    Recent Labs  Lab 05/08/20 2040  PROT 7.7  ALBUMIN 4.0  AST 29  ALT 28  ALKPHOS 92  BILITOT 0.7   Hematology Recent Labs  Lab 05/08/20 2040  WBC 9.1  RBC 5.14*  HGB 13.6  HCT 43.0  MCV 83.7  MCH 26.5  MCHC 31.6  RDW 16.3*  PLT 248   BNPNo results for input(s): BNP, PROBNP in the last 168 hours.  DDimer No  results for input(s): DDIMER in the last 168 hours.   Radiology/Studies:  No results found.         Assessment and Plan:   1. AICD shocks -No further episodes at this admission. -Interrogation revealed atrial tachycardia and nonsustained VT. -Atrial arrhythmia/tachycardia etiology for shocks -Decreasing atrial arrhythmias will definitely help. -Increase Lopressor to 50 mg 3 times daily.  Keep K over 4, mag over 2.  Avoiding calcium channel blocker due to history of gingivitis on amlodipine -Continue other cardiac medications as prescribed. -Telemetry monitoring reviewed, occasional PVCs, atrial paced rhythm -Patient can be discharged from a cardiac perspective on increased dose of beta-blocker.  She has appointment with primary cardiologist in 6 days.  She should receive second dose of beta-blocker prior to discharge.  2.  Paroxysmal A. Fib -Currently paced -Beta-blocker as above, continue Xarelto.  3.  Hypertension -Continue losartan, Lopressor 50 mg 3 times daily.  4.  History of hyperlipidemia -Continue PTA Crestor  Signed, Kate Sable, MD  05/09/2020 11:35 AM

## 2020-05-09 NOTE — Progress Notes (Signed)
During admission patient stated she had home meds. She refused to have these medications sent down to pharmacy per policy. She stated that she needs to take her medications at specific times and that she does not believe we would be able to guarantee her that. Charge RN Delrae Rend and on coming RN Danae Chen made aware.

## 2020-05-09 NOTE — ED Notes (Signed)
Pt assisted to toilet standby assist

## 2020-05-30 ENCOUNTER — Encounter: Payer: Self-pay | Admitting: Emergency Medicine

## 2020-05-30 ENCOUNTER — Emergency Department
Admission: EM | Admit: 2020-05-30 | Discharge: 2020-05-30 | Disposition: A | Discharge status: Home / Self Care | Payer: Medicare Other | Attending: Emergency Medicine | Admitting: Emergency Medicine

## 2020-05-30 ENCOUNTER — Other Ambulatory Visit: Payer: Self-pay

## 2020-05-30 ENCOUNTER — Emergency Department: Payer: Medicare Other

## 2020-05-30 DIAGNOSIS — I421 Obstructive hypertrophic cardiomyopathy: Secondary | ICD-10-CM

## 2020-05-30 DIAGNOSIS — I10 Essential (primary) hypertension: Secondary | ICD-10-CM | POA: Insufficient documentation

## 2020-05-30 DIAGNOSIS — Z789 Other specified health status: Secondary | ICD-10-CM | POA: Diagnosis not present

## 2020-05-30 DIAGNOSIS — R0789 Other chest pain: Secondary | ICD-10-CM | POA: Insufficient documentation

## 2020-05-30 DIAGNOSIS — E876 Hypokalemia: Secondary | ICD-10-CM | POA: Insufficient documentation

## 2020-05-30 DIAGNOSIS — E119 Type 2 diabetes mellitus without complications: Secondary | ICD-10-CM | POA: Diagnosis not present

## 2020-05-30 DIAGNOSIS — Z7982 Long term (current) use of aspirin: Secondary | ICD-10-CM | POA: Insufficient documentation

## 2020-05-30 DIAGNOSIS — Z79899 Other long term (current) drug therapy: Secondary | ICD-10-CM | POA: Insufficient documentation

## 2020-05-30 DIAGNOSIS — Z7901 Long term (current) use of anticoagulants: Secondary | ICD-10-CM | POA: Diagnosis not present

## 2020-05-30 DIAGNOSIS — N179 Acute kidney failure, unspecified: Secondary | ICD-10-CM

## 2020-05-30 LAB — CBC
HCT: 39.5 % (ref 36.0–46.0)
Hemoglobin: 12.7 g/dL (ref 12.0–15.0)
MCH: 27.1 pg (ref 26.0–34.0)
MCHC: 32.2 g/dL (ref 30.0–36.0)
MCV: 84.2 fL (ref 80.0–100.0)
Platelets: 248 10*3/uL (ref 150–400)
RBC: 4.69 MIL/uL (ref 3.87–5.11)
RDW: 16.1 % — ABNORMAL HIGH (ref 11.5–15.5)
WBC: 9.3 10*3/uL (ref 4.0–10.5)
nRBC: 0 % (ref 0.0–0.2)

## 2020-05-30 LAB — BASIC METABOLIC PANEL
Anion gap: 11 (ref 5–15)
BUN: 17 mg/dL (ref 8–23)
CO2: 25 mmol/L (ref 22–32)
Calcium: 9.1 mg/dL (ref 8.9–10.3)
Chloride: 103 mmol/L (ref 98–111)
Creatinine, Ser: 1.33 mg/dL — ABNORMAL HIGH (ref 0.44–1.00)
GFR calc Af Amer: 46 mL/min — ABNORMAL LOW (ref >60)
GFR calc non Af Amer: 39 mL/min — ABNORMAL LOW (ref >60)
Glucose, Bld: 145 mg/dL — ABNORMAL HIGH (ref 70–99)
Potassium: 3.4 mmol/L — ABNORMAL LOW (ref 3.5–5.1)
Sodium: 139 mmol/L (ref 135–145)

## 2020-05-30 LAB — TROPONIN I (HIGH SENSITIVITY)
Troponin I (High Sensitivity): 14 ng/L (ref <18)
Troponin I (High Sensitivity): 12 ng/L (ref <18)

## 2020-05-30 MED ORDER — POTASSIUM CHLORIDE CRYS ER 20 MEQ PO TBCR
40.0000 meq | EXTENDED_RELEASE_TABLET | Freq: Once | ORAL | Status: AC
  Administered 2020-05-30: 40 meq via ORAL
  Filled 2020-05-30: qty 2

## 2020-05-30 NOTE — ED Provider Notes (Signed)
[facility] Emergency Department Provider Note ____________________________________________   First MD Initiated Contact with Patient 05/30/20 1415     (approximate)  I have reviewed the triage vital signs and the nursing notes.  HISTORY  Chief Complaint Chest Pain   HPI Lindsay [name] is a 74 y.o. femalewho presents to the ED for evaluation of chest pain  Chart review indicates history of HTN, DM, A. fib on Tikosyn and Xarelto. Patient called cardiologist office this morning through [place].  Transmission of device data indicates no arrhythmias have been recorded for the past 2 days.  Battery and leads are functioning appropriately. Of note, patient had an outpatient DCCV yesterday for conversion out of A. fib, facilitated by propofol and only required 1 shock at 200 J.. Patient is typically atrially paced.  She reports an episode of her "chest thumping" this morning at about 3 AM.  She reports she was already awake when this happened and she reported 4/10 intensity left-sided chest pain that lasted a matter of seconds before self resolving and has not recurred.  He denies any associated diaphoresis, nausea, vomiting, syncope, shortness of breath or injury/trauma.  She denies discharge of her ICD.  She reports eating breakfast this morning that was well-tolerated.  Denies subsequent episodes of chest pain and reports feeling well here in the ED.  Past Medical History:  Diagnosis Date  . Diabetes mellitus without complication ([place])   . Hypertension     Patient Active Problem List   Diagnosis Date Noted  . Defibrillator discharge 05/09/2020  . Atrial fibrillation ([place]) 05/09/2020  . Anticoagulated 12/30/2019  . 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 Gastro-esophageal reflux disease with esophagitis 12/30/2017    Past Surgical History:  Procedure Laterality Date  . BREAST BIOPSY Right    2019-benign  . REDUCTION  MAMMAPLASTY Bilateral    2010    Prior to Admission medications   Medication Sig Start Date End Date Taking? Authorizing Provider  acetaminophen (TYLENOL) 500 MG tablet Take by mouth.   Yes [provider]  amiodarone (PACERONE) 200 MG tablet Take by mouth. 05/27/20  Yes [provider]  losartan (COZAAR) 50 MG tablet Take 50 mg by mouth 2 (two) times daily. 04/30/20  Yes [provider]  metoprolol succinate (TOPROL-XL) 100 MG 24 hr tablet Take 100 mg by mouth 2 (two) times daily. 05/27/20  Yes [provider]  Multiple Vitamins tablet Take 1 tablet by mouth daily. 01/26/20  Yes [provider]  NEXIUM 20 MG capsule Take 1 capsule by mouth daily as needed. 01/26/20  Yes [provider]  rosuvastatin (CRESTOR) 10 MG tablet Take 10 mg by mouth at bedtime. 04/28/20  Yes [provider]  XARELTO 20 MG TABS tablet Take 20 mg by mouth daily. 04/28/20  Yes [provider]  amoxicillin-clavulanate (AUGMENTIN) 875-125 MG tablet Take 1 tablet by mouth 2 (two) times daily.    [provider]  aspirin 81 MG EC tablet Aspir-81 mg tablet,delayed release  Take 1 tablet every day by oral route.    [provider]  beta carotene w/minerals (OCUVITE) tablet Take 1 tablet by mouth daily. 01/26/20   [provider]  calcium citrate-vitamin D (CITRACAL+D) 315-200 MG-UNIT tablet Take by mouth.    [provider]  MAGNESIUM OXIDE PO Take 165 mg by mouth daily.    [provider]    Allergies Meloxicam, Beta adrenergic blockers, Lotrel [amlodipine besy-benazepril hcl], and Levofloxacin  Family History  Problem Relation Age of Onset  . Breast cancer Mother     Social History Social History   Tobacco Use  . Smoking status: Never Smoker  . Smokeless tobacco: Never Used  Substance Use Topics  . Alcohol use: Never  . Drug use: Never    Review of Systems  Constitutional: No fever/chills Eyes:  No visual changes. ENT: No sore throat. Cardiovascular: Positive for chest pain Respiratory: Denies shortness of breath. Gastrointestinal: No abdominal pain.  No nausea, no vomiting.  No diarrhea.  No constipation. Genitourinary: Negative for dysuria. Musculoskeletal: Negative for back pain. Skin: Negative for rash. Neurological: Negative for headaches, focal weakness or numbness.   ____________________________________________   PHYSICAL EXAM:  VITAL SIGNS: Vitals:   05/30/20 1400 05/30/20 1635  BP: (!) 150/60 (!) 152/68  Pulse: 70 76  Resp: 16 18  Temp:    SpO2: 97% 97%      Constitutional: Alert and oriented. Well appearing and in no acute distress.  Well-appearing and conversational full sentences. Eyes: Conjunctivae are normal. PERRL. EOMI. Head: Atraumatic. Nose: No congestion/rhinnorhea. Mouth/Throat: Mucous membranes are moist.  Oropharynx non-erythematous. Neck: No stridor. No cervical spine tenderness to palpation. Cardiovascular: Normal rate, regular rhythm. Grossly normal heart sounds.  Good peripheral circulation. Cardiac device in place to the left chest wall without overlying skin breakdown, erythema or purulence.  Nontender. Respiratory: Normal respiratory effort.  No retractions. Lungs CTAB. Gastrointestinal: Soft , nondistended, nontender to palpation. No abdominal bruits. No CVA tenderness. Musculoskeletal: No lower extremity tenderness nor edema.  No joint effusions. No signs of acute trauma. Neurologic:  Normal speech and language. No gross focal neurologic deficits are appreciated. No gait instability noted. Cranial nerves II through XII intact 5/5 strength and sensation in all 4 extremities Ambulatory with a normal gait without assistance to the bathroom multiple times. Skin:  Skin is warm, dry and intact. No rash noted. Psychiatric: Mood and affect are normal. Speech and behavior are normal.  ____________________________________________    LABS (all labs ordered are listed, but only abnormal results are displayed)  Labs Reviewed  BASIC METABOLIC PANEL - Abnormal; Notable for the following components:      Result Value   Potassium 3.4 (*)    Glucose, Bld 145 (*)    Creatinine, Ser 1.33 (*)    GFR calc non Af Amer 39 (*)    GFR calc Af Amer 46 (*)    All other components within normal limits  CBC - Abnormal; Notable for the following components:   RDW 16.1 (*)    All other components within normal limits  TROPONIN I (HIGH SENSITIVITY)  TROPONIN I (HIGH SENSITIVITY)   ____________________________________________  12 Lead EKG  Atrially paced rhythm with rate of about 70 bpm, normal axis.  Bifascicular block is present.  No evidence of acute ischemia. ____________________________________________  RADIOLOGY  ED MD interpretation: 2 view CXR demonstrates cardiomegaly, ICD in place with intact leads and mild pulmonary vascular congestion.  Official radiology report(s): DG Chest 2 View  Result Date: 05/30/2020 CLINICAL DATA:  Chest pain.  History of atrial fibrillation EXAM: CHEST - 2 VIEW COMPARISON:  None. FINDINGS: There is cardiomegaly with pulmonary venous hypertension. Pacemaker leads attached to the right atrium. No pneumothorax. There is aortic atherosclerosis. No adenopathy. No appreciable edema or airspace opacity. There is degenerative change in the thoracic spine. No pneumothorax. IMPRESSION: No edema or airspace opacity. There is cardiomegaly with pulmonary vascular congestion. Pacemaker present. No adenopathy. Aortic Atherosclerosis (ICD10-I70.0). Electronically  Signed   By: Lowella Grip III M.D.   On: 05/30/2020 10:38   ____________________________________________   PROCEDURES and INTERVENTIONS  Procedure(s) performed (including Critical Care):  Procedures  Medications  potassium chloride SA (KLOR-CON) CR tablet 40 mEq (40 mEq Oral Given 05/30/20 1512)     ____________________________________________   MDM / ED COURSE  74 year old woman who was cardioverted yesterday for A. fib, presenting for evaluation after transient episode of chest pain, without evidence of cardiac ischemia or acute pathology, and amenable to outpatient management.  Normal vital signs on room air.  Reassuring examination without evidence of acute distress, pathology or neurovascular deficits.  Work-up with mild hypokalemia, that was repleted orally.  Creatinine is slightly worse from [facility] reading 2 weeks ago, 1.1->1.3.  Patient EKG is an atrially paced sinus rhythm, appropriately, and without evidence of acute ischemia.  Negative high-sensitivity troponin x2.  She is chest pain-free and without events here in the ED.  Her EP has already been transmitted and evaluated her cardiac device, without arrhythmias or shocks.  We discussed her outpatient management, following up with her cardiologist and return precautions for the ED were discussed.  Patient medically stable for discharge home.  Clinical Course as of May 30 1704  Wed May 30, 2020  [place] Reassessed.  Patient continues to be chest pain-free.  Educated patient on reassuring work-up that showed mild hypokalemia and AKI.  Urged the patient to follow-up with her PCP for recheck of her creatinine.   [DS]    Clinical Course User Index [DS] Vladimir Crofts, MD     ____________________________________________   FINAL CLINICAL IMPRESSION(S) / ED DIAGNOSES  Final diagnoses:  Other chest pain  Paced rhythm on electrocardiogram (ECG)  Hypokalemia  HOCM (hypertrophic obstructive cardiomyopathy) [facility])     ED Discharge Orders    None       Tachina Spoonemore   Note:  This document was prepared using Dragon voice recognition software and may include unintentional dictation errors.   Vladimir Crofts, MD 05/30/20 [phone]

## 2020-05-30 NOTE — ED Triage Notes (Signed)
Pt reports has been in A-fib for 3 weeks and had to be cardioverted yesterday. Pt reports this am she felt a thud in her left chest and heat radiating in her chest and she felt nauseated and had some diarrhea.

## 2020-05-30 NOTE — Discharge Instructions (Signed)
You were seen in the ED because of your chest pain overnight last night.  Thankfully, is no evidence of strain or damage in your heart.  I do not think the chest pain that you felt overnight was due to your heart.  Please follow-up with your PCP and various cardiologist as scheduled.   If you develop any further episodes of chest pain, especially combination with fevers, difficulty breathing or passing out, please return to the ED.

## 2020-05-30 NOTE — ED Triage Notes (Signed)
States she has an ICD  And was cardioverted yesterday  This am felt like she was shocked this am  Feeling funny in chest

## 2020-07-31 ENCOUNTER — Other Ambulatory Visit (HOSPITAL_COMMUNITY): Payer: Self-pay | Admitting: Internal Medicine

## 2020-07-31 ENCOUNTER — Other Ambulatory Visit: Payer: Self-pay | Admitting: Internal Medicine

## 2020-07-31 DIAGNOSIS — R1011 Right upper quadrant pain: Secondary | ICD-10-CM

## 2020-08-03 ENCOUNTER — Other Ambulatory Visit: Payer: Self-pay

## 2020-08-03 ENCOUNTER — Ambulatory Visit
Admission: RE | Admit: 2020-08-03 | Discharge: 2020-08-03 | Disposition: A | Discharge status: Home / Self Care | Payer: Medicare Other | Source: Ambulatory Visit | Attending: Internal Medicine | Admitting: Internal Medicine

## 2020-08-03 DIAGNOSIS — R1011 Right upper quadrant pain: Secondary | ICD-10-CM | POA: Diagnosis present

## 2020-08-06 ENCOUNTER — Ambulatory Visit: Payer: Medicare Other

## 2020-10-22 ENCOUNTER — Ambulatory Visit: Payer: Medicare Other | Admitting: Dermatology

## 2020-12-10 ENCOUNTER — Other Ambulatory Visit: Payer: Self-pay | Admitting: Internal Medicine

## 2020-12-10 DIAGNOSIS — Z1231 Encounter for screening mammogram for malignant neoplasm of breast: Secondary | ICD-10-CM

## 2021-01-24 ENCOUNTER — Other Ambulatory Visit: Payer: Self-pay

## 2021-01-24 ENCOUNTER — Ambulatory Visit
Admission: RE | Admit: 2021-01-24 | Discharge: 2021-01-24 | Disposition: A | Discharge status: Home / Self Care | Payer: Medicare Other | Source: Ambulatory Visit | Attending: Internal Medicine | Admitting: Internal Medicine

## 2021-01-24 DIAGNOSIS — Z1231 Encounter for screening mammogram for malignant neoplasm of breast: Secondary | ICD-10-CM | POA: Diagnosis present

## 2021-02-07 ENCOUNTER — Ambulatory Visit: Payer: Medicare Other | Admitting: Dermatology

## 2021-02-25 ENCOUNTER — Other Ambulatory Visit: Payer: Self-pay

## 2021-02-25 ENCOUNTER — Ambulatory Visit (INDEPENDENT_AMBULATORY_CARE_PROVIDER_SITE_OTHER): Payer: Medicare Other | Admitting: Dermatology

## 2021-02-25 DIAGNOSIS — Z85828 Personal history of other malignant neoplasm of skin: Secondary | ICD-10-CM | POA: Diagnosis not present

## 2021-02-25 DIAGNOSIS — N765 Ulceration of vagina: Secondary | ICD-10-CM | POA: Diagnosis not present

## 2021-02-25 DIAGNOSIS — D229 Melanocytic nevi, unspecified: Secondary | ICD-10-CM

## 2021-02-25 DIAGNOSIS — D692 Other nonthrombocytopenic purpura: Secondary | ICD-10-CM

## 2021-02-25 DIAGNOSIS — L820 Inflamed seborrheic keratosis: Secondary | ICD-10-CM | POA: Diagnosis not present

## 2021-02-25 DIAGNOSIS — Z1283 Encounter for screening for malignant neoplasm of skin: Secondary | ICD-10-CM

## 2021-02-25 DIAGNOSIS — L821 Other seborrheic keratosis: Secondary | ICD-10-CM

## 2021-02-25 DIAGNOSIS — D1800 Hemangioma unspecified site: Secondary | ICD-10-CM

## 2021-02-25 DIAGNOSIS — L219 Seborrheic dermatitis, unspecified: Secondary | ICD-10-CM

## 2021-02-25 DIAGNOSIS — L814 Other melanin hyperpigmentation: Secondary | ICD-10-CM

## 2021-02-25 DIAGNOSIS — L578 Other skin changes due to chronic exposure to nonionizing radiation: Secondary | ICD-10-CM

## 2021-02-25 MED ORDER — MUPIROCIN 2 % EX OINT: TOPICAL_OINTMENT | CUTANEOUS | 3 refills | Status: AC

## 2021-02-25 MED ORDER — KETOCONAZOLE 2 % EX SHAM: MEDICATED_SHAMPOO | CUTANEOUS | 6 refills | Status: AC

## 2021-02-25 NOTE — Progress Notes (Signed)
New Patient Visit  Subjective  Lindsay [name] is a 75 y.o. female who presents for the following: Annual Exam (Hx SCC on the L hand dorusm - April 2020). Patient a an ulceration in her vaginal area x 2 days. At first it was tender, but tenderness has improved since using Neosporin. Patient c/o itchy scalp.  The patient presents for Total-Body Skin Exam (TBSE) for skin cancer screening and mole check.  The following portions of the chart were reviewed this encounter and updated as appropriate:   Tobacco  Allergies  Meds  Problems  Med Hx  Surg Hx  Fam Hx     Review of Systems:  No other skin or systemic complaints except as noted in HPI or Assessment and Plan.  Objective  Well appearing patient in no apparent distress; mood and affect are within normal limits.  A full examination was performed including scalp, head, eyes, ears, nose, lips, neck, chest, axillae, abdomen, back, buttocks, bilateral upper extremities, bilateral lower extremities, hands, feet, fingers, toes, fingernails, and toenails. All findings within normal limits unless otherwise noted below.  Objective  L forearm x 1: Erythematous keratotic or waxy stuck-on papule or plaque.   Objective  Scalp: Clear.   Objective  Vaginal area: Crusted ulceration.  Assessment & Plan  Inflamed seborrheic keratosis L forearm x 1  Destruction of lesion - L forearm x 1 Complexity: simple   Destruction method: cryotherapy   Informed consent: discussed and consent obtained   Timeout:  patient name, date of birth, surgical site, and procedure verified Lesion destroyed using liquid nitrogen: Yes   Region frozen until ice ball extended beyond lesion: Yes   Outcome: patient tolerated procedure well with no complications   Post-procedure details: wound care instructions given    Seborrheic dermatitis Scalp  Seborrheic Dermatitis  -  is a chronic persistent rash characterized by pinkness and scaling most commonly of the  mid face but also can occur on the scalp (dandruff), ears; mid chest and mid back. It tends to be exacerbated by stress and cooler weather.  People who have neurologic disease may experience new onset or exacerbation of existing seborrheic dermatitis.  The condition is not curable but treatable and can be controlled.  Start Ketoconazole 2% shampoo 3d/wk. Let sit 5-10 minutes before washing out.   Consider Mometasone solution if not improving with treatment.   ketoconazole (NIZORAL) 2 % shampoo - Scalp  Ulceration of vagina 0.5 cm - healing, but persistent and not to goal Vaginal area Of unknown etiology -  Start Mupirocin 2% ointment to aa QD until healed.  mupirocin ointment (BACTROBAN) 2 % - Vaginal area Advised to have her PCP evaluate this for her GYN if it persists.  It may just be a small injury or friction related.  Lentigines - Scattered tan macules - Due to sun exposure - Benign-appering, observe - Recommend daily broad spectrum sunscreen SPF 30+ to sun-exposed areas, reapply every 2 hours as needed. - Call for any changes  Seborrheic Keratoses - Stuck-on, waxy, tan-brown papules and/or plaques  - Benign-appearing - Discussed benign etiology and prognosis. - Observe - Call for any changes  Melanocytic Nevi - Tan-brown and/or pink-flesh-colored symmetric macules and papules - Benign appearing on exam today - Observation - Call clinic for new or changing moles - Recommend daily use of broad spectrum spf 30+ sunscreen to sun-exposed areas.   Hemangiomas - Red papules - Discussed benign nature - Observe - Call for any changes  Actinic Damage -  Chronic condition, secondary to cumulative UV/sun exposure - diffuse scaly erythematous macules with underlying dyspigmentation - Recommend daily broad spectrum sunscreen SPF 30+ to sun-exposed areas, reapply every 2 hours as needed.  - Staying in the shade or wearing long sleeves, sun glasses (UVA+UVB protection) and wide  brim hats (4-inch brim around the entire circumference of the hat) are also recommended for sun protection.  - Call for new or changing lesions.  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  - Call if any new or changing lesions are noted between office visits  Purpura - Chronic; persistent and recurrent.  Treatable, but not curable. - Violaceous macules and patches - Benign - Related to trauma, age, sun damage and/or use of blood thinners, chronic use of topical and/or oral steroids - Observe - Can use OTC arnica containing moisturizer such as Dermend Bruise Formula if desired - Call for worsening or other concerns  Skin cancer screening performed today.  Return in about 1 year (around 02/25/2022) for TBSE.  Luther Redo, CMA, am acting as scribe for Sarina Ser, MD .  Documentation: I have reviewed the above documentation for accuracy and completeness, and I agree with the above.  Sarina Ser, MD

## 2021-02-25 NOTE — Patient Instructions (Signed)

## 2021-03-02 ENCOUNTER — Encounter: Payer: Self-pay | Admitting: Dermatology

## 2021-04-07 ENCOUNTER — Emergency Department: Payer: Medicare Other | Admitting: Anesthesiology

## 2021-04-07 ENCOUNTER — Encounter: Payer: Self-pay | Admitting: Emergency Medicine

## 2021-04-07 ENCOUNTER — Other Ambulatory Visit: Payer: Self-pay

## 2021-04-07 ENCOUNTER — Inpatient Hospital Stay
Admission: EM | Admit: 2021-04-07 | Discharge: 2021-05-06 | DRG: 853 | Disposition: E | Payer: Medicare Other | Attending: Internal Medicine | Admitting: Internal Medicine

## 2021-04-07 ENCOUNTER — Encounter: Admission: EM | Disposition: E | Payer: Self-pay | Source: Home / Self Care | Attending: Pulmonary Disease

## 2021-04-07 ENCOUNTER — Inpatient Hospital Stay: Payer: Medicare Other

## 2021-04-07 ENCOUNTER — Emergency Department: Payer: Medicare Other

## 2021-04-07 DIAGNOSIS — K5720 Diverticulitis of large intestine with perforation and abscess without bleeding: Secondary | ICD-10-CM | POA: Diagnosis not present

## 2021-04-07 DIAGNOSIS — I421 Obstructive hypertrophic cardiomyopathy: Secondary | ICD-10-CM | POA: Diagnosis present

## 2021-04-07 DIAGNOSIS — D271 Benign neoplasm of left ovary: Secondary | ICD-10-CM

## 2021-04-07 DIAGNOSIS — Z515 Encounter for palliative care: Secondary | ICD-10-CM

## 2021-04-07 DIAGNOSIS — K668 Other specified disorders of peritoneum: Secondary | ICD-10-CM

## 2021-04-07 DIAGNOSIS — J9600 Acute respiratory failure, unspecified whether with hypoxia or hypercapnia: Secondary | ICD-10-CM

## 2021-04-07 DIAGNOSIS — E1122 Type 2 diabetes mellitus with diabetic chronic kidney disease: Secondary | ICD-10-CM | POA: Diagnosis present

## 2021-04-07 DIAGNOSIS — J9602 Acute respiratory failure with hypercapnia: Secondary | ICD-10-CM | POA: Diagnosis present

## 2021-04-07 DIAGNOSIS — E871 Hypo-osmolality and hyponatremia: Secondary | ICD-10-CM | POA: Diagnosis present

## 2021-04-07 DIAGNOSIS — I5032 Chronic diastolic (congestive) heart failure: Secondary | ICD-10-CM | POA: Diagnosis present

## 2021-04-07 DIAGNOSIS — Z801 Family history of malignant neoplasm of trachea, bronchus and lung: Secondary | ICD-10-CM

## 2021-04-07 DIAGNOSIS — J9690 Respiratory failure, unspecified, unspecified whether with hypoxia or hypercapnia: Secondary | ICD-10-CM

## 2021-04-07 DIAGNOSIS — R4020 Unspecified coma: Secondary | ICD-10-CM | POA: Diagnosis not present

## 2021-04-07 DIAGNOSIS — K631 Perforation of intestine (nontraumatic): Secondary | ICD-10-CM | POA: Diagnosis not present

## 2021-04-07 DIAGNOSIS — I82409 Acute embolism and thrombosis of unspecified deep veins of unspecified lower extremity: Secondary | ICD-10-CM

## 2021-04-07 DIAGNOSIS — Z9049 Acquired absence of other specified parts of digestive tract: Secondary | ICD-10-CM

## 2021-04-07 DIAGNOSIS — T8189XA Other complications of procedures, not elsewhere classified, initial encounter: Secondary | ICD-10-CM | POA: Diagnosis not present

## 2021-04-07 DIAGNOSIS — J9601 Acute respiratory failure with hypoxia: Secondary | ICD-10-CM | POA: Diagnosis present

## 2021-04-07 DIAGNOSIS — G928 Other toxic encephalopathy: Secondary | ICD-10-CM | POA: Diagnosis not present

## 2021-04-07 DIAGNOSIS — I4891 Unspecified atrial fibrillation: Secondary | ICD-10-CM

## 2021-04-07 DIAGNOSIS — A419 Sepsis, unspecified organism: Secondary | ICD-10-CM | POA: Diagnosis present

## 2021-04-07 DIAGNOSIS — E1169 Type 2 diabetes mellitus with other specified complication: Secondary | ICD-10-CM | POA: Diagnosis present

## 2021-04-07 DIAGNOSIS — Z978 Presence of other specified devices: Secondary | ICD-10-CM

## 2021-04-07 DIAGNOSIS — Z9911 Dependence on respirator [ventilator] status: Secondary | ICD-10-CM

## 2021-04-07 DIAGNOSIS — K219 Gastro-esophageal reflux disease without esophagitis: Secondary | ICD-10-CM | POA: Diagnosis present

## 2021-04-07 DIAGNOSIS — I471 Supraventricular tachycardia: Secondary | ICD-10-CM | POA: Diagnosis present

## 2021-04-07 DIAGNOSIS — K659 Peritonitis, unspecified: Secondary | ICD-10-CM

## 2021-04-07 DIAGNOSIS — D7811 Accidental puncture and laceration of the spleen during a procedure on the spleen: Secondary | ICD-10-CM | POA: Diagnosis not present

## 2021-04-07 DIAGNOSIS — D62 Acute posthemorrhagic anemia: Secondary | ICD-10-CM | POA: Diagnosis present

## 2021-04-07 DIAGNOSIS — Z66 Do not resuscitate: Secondary | ICD-10-CM | POA: Diagnosis not present

## 2021-04-07 DIAGNOSIS — R6521 Severe sepsis with septic shock: Secondary | ICD-10-CM | POA: Diagnosis present

## 2021-04-07 DIAGNOSIS — R188 Other ascites: Secondary | ICD-10-CM | POA: Diagnosis not present

## 2021-04-07 DIAGNOSIS — N990 Postprocedural (acute) (chronic) kidney failure: Secondary | ICD-10-CM | POA: Diagnosis not present

## 2021-04-07 DIAGNOSIS — Z20822 Contact with and (suspected) exposure to covid-19: Secondary | ICD-10-CM | POA: Diagnosis present

## 2021-04-07 DIAGNOSIS — E872 Acidosis, unspecified: Secondary | ICD-10-CM | POA: Diagnosis present

## 2021-04-07 DIAGNOSIS — R6520 Severe sepsis without septic shock: Secondary | ICD-10-CM

## 2021-04-07 DIAGNOSIS — D72829 Elevated white blood cell count, unspecified: Secondary | ICD-10-CM | POA: Diagnosis not present

## 2021-04-07 DIAGNOSIS — J95851 Ventilator associated pneumonia: Secondary | ICD-10-CM

## 2021-04-07 DIAGNOSIS — I4819 Other persistent atrial fibrillation: Secondary | ICD-10-CM | POA: Diagnosis present

## 2021-04-07 DIAGNOSIS — I130 Hypertensive heart and chronic kidney disease with heart failure and stage 1 through stage 4 chronic kidney disease, or unspecified chronic kidney disease: Secondary | ICD-10-CM | POA: Diagnosis present

## 2021-04-07 DIAGNOSIS — K5780 Diverticulitis of intestine, part unspecified, with perforation and abscess without bleeding: Secondary | ICD-10-CM | POA: Diagnosis present

## 2021-04-07 DIAGNOSIS — I97191 Other postprocedural cardiac functional disturbances following other surgery: Secondary | ICD-10-CM | POA: Diagnosis not present

## 2021-04-07 DIAGNOSIS — Z8249 Family history of ischemic heart disease and other diseases of the circulatory system: Secondary | ICD-10-CM

## 2021-04-07 DIAGNOSIS — N1831 Chronic kidney disease, stage 3a: Secondary | ICD-10-CM | POA: Diagnosis present

## 2021-04-07 DIAGNOSIS — L8990 Pressure ulcer of unspecified site, unspecified stage: Secondary | ICD-10-CM | POA: Insufficient documentation

## 2021-04-07 DIAGNOSIS — D6489 Other specified anemias: Secondary | ICD-10-CM | POA: Diagnosis not present

## 2021-04-07 DIAGNOSIS — Z85828 Personal history of other malignant neoplasm of skin: Secondary | ICD-10-CM

## 2021-04-07 DIAGNOSIS — K651 Peritoneal abscess: Secondary | ICD-10-CM | POA: Diagnosis present

## 2021-04-07 DIAGNOSIS — Z7982 Long term (current) use of aspirin: Secondary | ICD-10-CM

## 2021-04-07 DIAGNOSIS — D689 Coagulation defect, unspecified: Secondary | ICD-10-CM

## 2021-04-07 DIAGNOSIS — E869 Volume depletion, unspecified: Secondary | ICD-10-CM | POA: Diagnosis present

## 2021-04-07 DIAGNOSIS — Z452 Encounter for adjustment and management of vascular access device: Secondary | ICD-10-CM

## 2021-04-07 DIAGNOSIS — Y658 Other specified misadventures during surgical and medical care: Secondary | ICD-10-CM | POA: Diagnosis not present

## 2021-04-07 DIAGNOSIS — B961 Klebsiella pneumoniae [K. pneumoniae] as the cause of diseases classified elsewhere: Secondary | ICD-10-CM | POA: Diagnosis present

## 2021-04-07 DIAGNOSIS — Z803 Family history of malignant neoplasm of breast: Secondary | ICD-10-CM

## 2021-04-07 DIAGNOSIS — Z95810 Presence of automatic (implantable) cardiac defibrillator: Secondary | ICD-10-CM

## 2021-04-07 DIAGNOSIS — R000 Tachycardia, unspecified: Secondary | ICD-10-CM | POA: Diagnosis not present

## 2021-04-07 DIAGNOSIS — N170 Acute kidney failure with tubular necrosis: Secondary | ICD-10-CM | POA: Diagnosis present

## 2021-04-07 DIAGNOSIS — I493 Ventricular premature depolarization: Secondary | ICD-10-CM | POA: Diagnosis present

## 2021-04-07 DIAGNOSIS — I422 Other hypertrophic cardiomyopathy: Secondary | ICD-10-CM | POA: Diagnosis present

## 2021-04-07 DIAGNOSIS — K660 Peritoneal adhesions (postprocedural) (postinfection): Secondary | ICD-10-CM | POA: Diagnosis present

## 2021-04-07 DIAGNOSIS — R600 Localized edema: Secondary | ICD-10-CM

## 2021-04-07 DIAGNOSIS — Z7901 Long term (current) use of anticoagulants: Secondary | ICD-10-CM

## 2021-04-07 DIAGNOSIS — N179 Acute kidney failure, unspecified: Secondary | ICD-10-CM

## 2021-04-07 DIAGNOSIS — Z7189 Other specified counseling: Secondary | ICD-10-CM | POA: Diagnosis not present

## 2021-04-07 DIAGNOSIS — Z79899 Other long term (current) drug therapy: Secondary | ICD-10-CM

## 2021-04-07 DIAGNOSIS — E785 Hyperlipidemia, unspecified: Secondary | ICD-10-CM | POA: Diagnosis present

## 2021-04-07 HISTORY — PX: SALPINGOOPHORECTOMY: SHX82

## 2021-04-07 HISTORY — PX: LYSIS OF ADHESION: SHX5961

## 2021-04-07 HISTORY — PX: LAPAROTOMY: SHX154

## 2021-04-07 HISTORY — PX: APPLICATION OF WOUND VAC: SHX5189

## 2021-04-07 HISTORY — PX: COLOSTOMY: SHX63

## 2021-04-07 LAB — CBC
HCT: 38.3 % (ref 36.0–46.0)
Hemoglobin: 12 g/dL (ref 12.0–15.0)
MCH: 24.9 pg — ABNORMAL LOW (ref 26.0–34.0)
MCHC: 31.3 g/dL (ref 30.0–36.0)
MCV: 79.6 fL — ABNORMAL LOW (ref 80.0–100.0)
Platelets: 461 10*3/uL — ABNORMAL HIGH (ref 150–400)
RBC: 4.81 MIL/uL (ref 3.87–5.11)
RDW: 17.5 % — ABNORMAL HIGH (ref 11.5–15.5)
WBC: 19 10*3/uL — ABNORMAL HIGH (ref 4.0–10.5)
nRBC: 0 % (ref 0.0–0.2)

## 2021-04-07 LAB — GLUCOSE, CAPILLARY
Glucose-Capillary: 133 mg/dL — ABNORMAL HIGH (ref 70–99)
Glucose-Capillary: 133 mg/dL — ABNORMAL HIGH (ref 70–99)
Glucose-Capillary: 145 mg/dL — ABNORMAL HIGH (ref 70–99)
Glucose-Capillary: 161 mg/dL — ABNORMAL HIGH (ref 70–99)

## 2021-04-07 LAB — COMPREHENSIVE METABOLIC PANEL
ALT: 41 U/L (ref 0–44)
ALT: 21 U/L (ref 0–44)
AST: 56 U/L — ABNORMAL HIGH (ref 15–41)
AST: 42 U/L — ABNORMAL HIGH (ref 15–41)
Albumin: 2.6 g/dL — ABNORMAL LOW (ref 3.5–5.0)
Albumin: 2.3 g/dL — ABNORMAL LOW (ref 3.5–5.0)
Alkaline Phosphatase: 70 U/L (ref 38–126)
Alkaline Phosphatase: 28 U/L — ABNORMAL LOW (ref 38–126)
Anion gap: 13 (ref 5–15)
Anion gap: 11 (ref 5–15)
BUN: 81 mg/dL — ABNORMAL HIGH (ref 8–23)
BUN: 70 mg/dL — ABNORMAL HIGH (ref 8–23)
CO2: 19 mmol/L — ABNORMAL LOW (ref 22–32)
CO2: 19 mmol/L — ABNORMAL LOW (ref 22–32)
Calcium: 8.7 mg/dL — ABNORMAL LOW (ref 8.9–10.3)
Calcium: 7.5 mg/dL — ABNORMAL LOW (ref 8.9–10.3)
Chloride: 96 mmol/L — ABNORMAL LOW (ref 98–111)
Chloride: 102 mmol/L (ref 98–111)
Creatinine, Ser: 4.81 mg/dL — ABNORMAL HIGH (ref 0.44–1.00)
Creatinine, Ser: 3.95 mg/dL — ABNORMAL HIGH (ref 0.44–1.00)
GFR, Estimated: 9 mL/min — ABNORMAL LOW (ref >60)
GFR, Estimated: 11 mL/min — ABNORMAL LOW (ref >60)
Glucose, Bld: 146 mg/dL — ABNORMAL HIGH (ref 70–99)
Glucose, Bld: 152 mg/dL — ABNORMAL HIGH (ref 70–99)
Potassium: 4.1 mmol/L (ref 3.5–5.1)
Potassium: 4.3 mmol/L (ref 3.5–5.1)
Sodium: 128 mmol/L — ABNORMAL LOW (ref 135–145)
Sodium: 132 mmol/L — ABNORMAL LOW (ref 135–145)
Total Bilirubin: 0.9 mg/dL (ref 0.3–1.2)
Total Bilirubin: 1.4 mg/dL — ABNORMAL HIGH (ref 0.3–1.2)
Total Protein: 6.6 g/dL (ref 6.5–8.1)
Total Protein: 3.8 g/dL — ABNORMAL LOW (ref 6.5–8.1)

## 2021-04-07 LAB — BLOOD GAS, ARTERIAL
Acid-base deficit: 9.7 mmol/L — ABNORMAL HIGH (ref 0.0–2.0)
Acid-base deficit: 8.2 mmol/L — ABNORMAL HIGH (ref 0.0–2.0)
Bicarbonate: 16.6 mmol/L — ABNORMAL LOW (ref 20.0–28.0)
Bicarbonate: 17.9 mmol/L — ABNORMAL LOW (ref 20.0–28.0)
FIO2: 0.5
MECHVT: 450 mL
Mechanical Rate: 16
O2 Saturation: 100 %
O2 Saturation: 99.7 %
PEEP: 5 cmH2O
Patient temperature: 37
Patient temperature: 37
pCO2 arterial: 37 mmHg (ref 32.0–48.0)
pCO2 arterial: 38 mmHg (ref 32.0–48.0)
pH, Arterial: 7.26 — ABNORMAL LOW (ref 7.350–7.450)
pH, Arterial: 7.28 — ABNORMAL LOW (ref 7.350–7.450)
pO2, Arterial: 399 mmHg — ABNORMAL HIGH (ref 83.0–108.0)
pO2, Arterial: 216 mmHg — ABNORMAL HIGH (ref 83.0–108.0)

## 2021-04-07 LAB — CBC WITH DIFFERENTIAL/PLATELET
Abs Immature Granulocytes: 0.05 10*3/uL (ref 0.00–0.07)
Basophils Absolute: 0 10*3/uL (ref 0.0–0.1)
Basophils Relative: 0 %
Eosinophils Absolute: 0 10*3/uL (ref 0.0–0.5)
Eosinophils Relative: 0 %
HCT: 21.3 % — ABNORMAL LOW (ref 36.0–46.0)
Hemoglobin: 6.7 g/dL — CL (ref 12.0–15.0)
Immature Granulocytes: 0 %
Lymphocytes Relative: 1 %
Lymphs Abs: 0.1 10*3/uL — ABNORMAL LOW (ref 0.7–4.0)
MCH: 25.1 pg — ABNORMAL LOW (ref 26.0–34.0)
MCHC: 31.5 g/dL (ref 30.0–36.0)
MCV: 79.8 fL — ABNORMAL LOW (ref 80.0–100.0)
Monocytes Absolute: 0.2 10*3/uL (ref 0.1–1.0)
Monocytes Relative: 1 %
Neutro Abs: 14.5 10*3/uL — ABNORMAL HIGH (ref 1.7–7.7)
Neutrophils Relative %: 98 %
Platelets: 266 10*3/uL (ref 150–400)
RBC: 2.67 MIL/uL — ABNORMAL LOW (ref 3.87–5.11)
RDW: 17.5 % — ABNORMAL HIGH (ref 11.5–15.5)
Smear Review: NORMAL
WBC: 14.9 10*3/uL — ABNORMAL HIGH (ref 4.0–10.5)
nRBC: 0 % (ref 0.0–0.2)

## 2021-04-07 LAB — URINALYSIS, COMPLETE (UACMP) WITH MICROSCOPIC
Bilirubin Urine: NEGATIVE
Glucose, UA: 50 mg/dL — AB
Ketones, ur: NEGATIVE mg/dL
Nitrite: NEGATIVE
Protein, ur: 100 mg/dL — AB
Specific Gravity, Urine: 1.02 (ref 1.005–1.030)
pH: 5 (ref 5.0–8.0)

## 2021-04-07 LAB — MRSA NEXT GEN BY PCR, NASAL: MRSA by PCR Next Gen: NOT DETECTED

## 2021-04-07 LAB — ABO/RH: ABO/RH(D): A POS

## 2021-04-07 LAB — LACTIC ACID, PLASMA
Lactic Acid, Venous: 2.8 mmol/L (ref 0.5–1.9)
Lactic Acid, Venous: 2.9 mmol/L (ref 0.5–1.9)
Lactic Acid, Venous: 2.9 mmol/L (ref 0.5–1.9)

## 2021-04-07 LAB — CORTISOL: Cortisol, Plasma: 61.8 ug/dL

## 2021-04-07 LAB — PROCALCITONIN: Procalcitonin: 15.49 ng/mL

## 2021-04-07 LAB — RESP PANEL BY RT-PCR (FLU A&B, COVID) ARPGX2
Influenza A by PCR: NEGATIVE
Influenza B by PCR: NEGATIVE
SARS Coronavirus 2 by RT PCR: NEGATIVE

## 2021-04-07 LAB — LIPASE, BLOOD: Lipase: 41 U/L (ref 11–51)

## 2021-04-07 LAB — HEMOGLOBIN AND HEMATOCRIT, BLOOD
HCT: 31.3 % — ABNORMAL LOW (ref 36.0–46.0)
Hemoglobin: 10.5 g/dL — ABNORMAL LOW (ref 12.0–15.0)

## 2021-04-07 LAB — MAGNESIUM: Magnesium: 2 mg/dL (ref 1.7–2.4)

## 2021-04-07 LAB — PROTIME-INR
INR: 3.3 — ABNORMAL HIGH (ref 0.8–1.2)
Prothrombin Time: 33.4 seconds — ABNORMAL HIGH (ref 11.4–15.2)

## 2021-04-07 LAB — PREPARE RBC (CROSSMATCH)

## 2021-04-07 LAB — APTT: aPTT: 34 seconds (ref 24–36)

## 2021-04-07 LAB — FIBRINOGEN: Fibrinogen: 382 mg/dL (ref 210–475)

## 2021-04-07 SURGERY — LAPAROTOMY, EXPLORATORY
Anesthesia: General | Site: Abdomen

## 2021-04-07 MED ORDER — LACTATED RINGERS IV SOLN: INTRAVENOUS | Status: DC | PRN

## 2021-04-07 MED ORDER — ONDANSETRON HCL 4 MG/2ML IJ SOLN
4.0000 mg | Freq: Once | INTRAMUSCULAR | Status: AC
  Administered 2021-04-07: 4 mg via INTRAVENOUS
  Filled 2021-04-07: qty 2

## 2021-04-07 MED ORDER — NOREPINEPHRINE 4 MG/250ML-% IV SOLN
INTRAVENOUS | Status: AC
  Filled 2021-04-07: qty 250

## 2021-04-07 MED ORDER — NOREPINEPHRINE 4 MG/250ML-% IV SOLN
0.0000 ug/min | INTRAVENOUS | Status: DC
  Administered 2021-04-08: 3 ug/min via INTRAVENOUS
  Filled 2021-04-07: qty 250

## 2021-04-07 MED ORDER — ORAL CARE MOUTH RINSE
15.0000 mL | OROMUCOSAL | Status: DC
  Administered 2021-04-07 – 2021-04-20 (×133): 15 mL via OROMUCOSAL

## 2021-04-07 MED ORDER — EPHEDRINE SULFATE 50 MG/ML IJ SOLN
INTRAMUSCULAR | Status: DC | PRN
  Administered 2021-04-07 (×2): 10 mg via INTRAVENOUS
  Administered 2021-04-07 (×2): 5 mg via INTRAVENOUS
  Administered 2021-04-07 (×3): 10 mg via INTRAVENOUS

## 2021-04-07 MED ORDER — ROCURONIUM BROMIDE 100 MG/10ML IV SOLN
INTRAVENOUS | Status: DC | PRN
  Administered 2021-04-07: 50 mg via INTRAVENOUS
  Administered 2021-04-07: 30 mg via INTRAVENOUS
  Administered 2021-04-07: 20 mg via INTRAVENOUS

## 2021-04-07 MED ORDER — LACTATED RINGERS IV SOLN: INTRAVENOUS | Status: DC

## 2021-04-07 MED ORDER — ALBUMIN HUMAN 5 % IV SOLN
INTRAVENOUS | Status: AC
  Filled 2021-04-07: qty 250

## 2021-04-07 MED ORDER — LACTATED RINGERS IV BOLUS
2000.0000 mL | Freq: Once | INTRAVENOUS | Status: AC
  Administered 2021-04-07: 2000 mL via INTRAVENOUS

## 2021-04-07 MED ORDER — SODIUM CHLORIDE 0.9% IV SOLUTION: Freq: Once | INTRAVENOUS | Status: AC

## 2021-04-07 MED ORDER — EPHEDRINE 5 MG/ML INJ
INTRAVENOUS | Status: AC
  Filled 2021-04-07: qty 20

## 2021-04-07 MED ORDER — ALBUMIN HUMAN 5 % IV SOLN
INTRAVENOUS | Status: AC
  Filled 2021-04-07: qty 500

## 2021-04-07 MED ORDER — FENTANYL CITRATE (PF) 100 MCG/2ML IJ SOLN
INTRAMUSCULAR | Status: AC
  Filled 2021-04-07: qty 2

## 2021-04-07 MED ORDER — PROPOFOL 10 MG/ML IV BOLUS
INTRAVENOUS | Status: AC
  Filled 2021-04-07: qty 20

## 2021-04-07 MED ORDER — PHENYLEPHRINE HCL (PRESSORS) 10 MG/ML IV SOLN
INTRAVENOUS | Status: AC
  Filled 2021-04-07: qty 1

## 2021-04-07 MED ORDER — NOREPINEPHRINE 4 MG/250ML-% IV SOLN
0.0000 ug/min | INTRAVENOUS | Status: DC
  Administered 2021-04-07: 4 ug/min via INTRAVENOUS

## 2021-04-07 MED ORDER — SODIUM CHLORIDE 0.9 % IV SOLN
INTRAVENOUS | Status: DC | PRN
  Administered 2021-04-07: 50 ug/min via INTRAVENOUS

## 2021-04-07 MED ORDER — SEVOFLURANE IN SOLN
RESPIRATORY_TRACT | Status: AC
  Filled 2021-04-07: qty 250

## 2021-04-07 MED ORDER — PIPERACILLIN-TAZOBACTAM 3.375 G IVPB 30 MIN
3.3750 g | Freq: Once | INTRAVENOUS | Status: AC
  Administered 2021-04-07: 3.375 g via INTRAVENOUS
  Filled 2021-04-07: qty 50

## 2021-04-07 MED ORDER — NOREPINEPHRINE 4 MG/250ML-% IV SOLN
INTRAVENOUS | Status: AC
  Administered 2021-04-07: 2 ug/min via INTRAVENOUS
  Filled 2021-04-07: qty 250

## 2021-04-07 MED ORDER — CHLORHEXIDINE GLUCONATE CLOTH 2 % EX PADS: 6.0000 | MEDICATED_PAD | Freq: Every day | CUTANEOUS | Status: DC

## 2021-04-07 MED ORDER — PROPOFOL 1000 MG/100ML IV EMUL
INTRAVENOUS | Status: AC
  Administered 2021-04-07: 10 ug/kg/min via INTRAVENOUS
  Filled 2021-04-07: qty 100

## 2021-04-07 MED ORDER — SUCCINYLCHOLINE CHLORIDE 20 MG/ML IJ SOLN
INTRAMUSCULAR | Status: DC | PRN
  Administered 2021-04-07: 120 mg via INTRAVENOUS

## 2021-04-07 MED ORDER — SODIUM CHLORIDE 0.9 % IV SOLN: INTRAVENOUS | Status: DC

## 2021-04-07 MED ORDER — PIPERACILLIN-TAZOBACTAM IN DEX 2-0.25 GM/50ML IV SOLN
2.2500 g | Freq: Three times a day (TID) | INTRAVENOUS | Status: DC
  Administered 2021-04-07 – 2021-04-08 (×4): 2.25 g via INTRAVENOUS
  Filled 2021-04-07 (×6): qty 50

## 2021-04-07 MED ORDER — CHLORHEXIDINE GLUCONATE 0.12% ORAL RINSE (MEDLINE KIT)
15.0000 mL | Freq: Two times a day (BID) | OROMUCOSAL | Status: DC
  Administered 2021-04-07 – 2021-04-20 (×27): 15 mL via OROMUCOSAL

## 2021-04-07 MED ORDER — FENTANYL 2500MCG IN NS 250ML (10MCG/ML) PREMIX INFUSION
0.0000 ug/h | INTRAVENOUS | Status: DC
  Administered 2021-04-08: 100 ug/h via INTRAVENOUS
  Administered 2021-04-09: 400 ug/h via INTRAVENOUS
  Filled 2021-04-07 (×2): qty 250

## 2021-04-07 MED ORDER — 0.9 % SODIUM CHLORIDE (POUR BTL) OPTIME
TOPICAL | Status: DC | PRN
  Administered 2021-04-07: 1000 mL
  Administered 2021-04-07: 500 mL
  Administered 2021-04-07 (×7): 1000 mL

## 2021-04-07 MED ORDER — SODIUM BICARBONATE 8.4 % IV SOLN
INTRAVENOUS | Status: AC
  Filled 2021-04-07: qty 50

## 2021-04-07 MED ORDER — FENTANYL CITRATE (PF) 100 MCG/2ML IJ SOLN
INTRAMUSCULAR | Status: DC | PRN
  Administered 2021-04-07: 50 ug via INTRAVENOUS

## 2021-04-07 MED ORDER — SODIUM BICARBONATE 8.4 % IV SOLN
INTRAVENOUS | Status: DC | PRN
  Administered 2021-04-07: 50 meq via INTRAVENOUS

## 2021-04-07 MED ORDER — LIDOCAINE HCL (CARDIAC) PF 100 MG/5ML IV SOSY
PREFILLED_SYRINGE | INTRAVENOUS | Status: DC | PRN
  Administered 2021-04-07: 80 mg via INTRAVENOUS

## 2021-04-07 MED ORDER — SODIUM CHLORIDE 0.9 % IV SOLN
200.0000 mg | Freq: Once | INTRAVENOUS | Status: AC
  Administered 2021-04-07: 200 mg via INTRAVENOUS
  Filled 2021-04-07: qty 200

## 2021-04-07 MED ORDER — ROCURONIUM BROMIDE 10 MG/ML (PF) SYRINGE
PREFILLED_SYRINGE | INTRAVENOUS | Status: AC
  Filled 2021-04-07: qty 10

## 2021-04-07 MED ORDER — PROPOFOL 1000 MG/100ML IV EMUL
5.0000 ug/kg/min | INTRAVENOUS | Status: DC
  Administered 2021-04-07: 40 ug/kg/min via INTRAVENOUS
  Administered 2021-04-08 (×2): 30 ug/kg/min via INTRAVENOUS
  Administered 2021-04-08: 40 ug/kg/min via INTRAVENOUS
  Administered 2021-04-09: 20 ug/kg/min via INTRAVENOUS
  Administered 2021-04-09: 40 ug/kg/min via INTRAVENOUS
  Administered 2021-04-09: 15 ug/kg/min via INTRAVENOUS
  Administered 2021-04-10 – 2021-04-12 (×7): 25 ug/kg/min via INTRAVENOUS
  Filled 2021-04-07 (×14): qty 100

## 2021-04-07 MED ORDER — SUCCINYLCHOLINE CHLORIDE 200 MG/10ML IV SOSY
PREFILLED_SYRINGE | INTRAVENOUS | Status: AC
  Filled 2021-04-07: qty 10

## 2021-04-07 MED ORDER — MIDAZOLAM HCL 2 MG/2ML IJ SOLN: 2.0000 mg | INTRAMUSCULAR | Status: DC | PRN

## 2021-04-07 MED ORDER — PANTOPRAZOLE SODIUM 40 MG IV SOLR
40.0000 mg | Freq: Every day | INTRAVENOUS | Status: DC
  Administered 2021-04-07 – 2021-04-20 (×14): 40 mg via INTRAVENOUS
  Filled 2021-04-07 (×14): qty 40

## 2021-04-07 MED ORDER — FENTANYL CITRATE (PF) 100 MCG/2ML IJ SOLN
25.0000 ug | Freq: Once | INTRAMUSCULAR | Status: AC
  Administered 2021-04-07: 25 ug via INTRAVENOUS

## 2021-04-07 MED ORDER — SODIUM CHLORIDE 0.45 % IV SOLN
INTRAVENOUS | Status: DC
  Filled 2021-04-07 (×3): qty 75

## 2021-04-07 MED ORDER — ALBUMIN HUMAN 5 % IV SOLN: INTRAVENOUS | Status: DC | PRN

## 2021-04-07 MED ORDER — PROPOFOL 10 MG/ML IV BOLUS
INTRAVENOUS | Status: DC | PRN
  Administered 2021-04-07: 70 mg via INTRAVENOUS

## 2021-04-07 MED ORDER — PHENYLEPHRINE HCL (PRESSORS) 10 MG/ML IV SOLN
INTRAVENOUS | Status: DC | PRN
  Administered 2021-04-07: 100 ug via INTRAVENOUS
  Administered 2021-04-07: 200 ug via INTRAVENOUS
  Administered 2021-04-07 (×6): 100 ug via INTRAVENOUS
  Administered 2021-04-07 (×2): 200 ug via INTRAVENOUS

## 2021-04-07 MED ORDER — ONDANSETRON HCL 4 MG/2ML IJ SOLN
INTRAMUSCULAR | Status: AC
  Filled 2021-04-07: qty 2

## 2021-04-07 MED ORDER — HEMOSTATIC AGENTS (NO CHARGE) OPTIME
TOPICAL | Status: DC | PRN
  Administered 2021-04-07 (×2): 1 via TOPICAL

## 2021-04-07 MED ORDER — "FIBRIN SEALANT 5 ML SINGLE DOSE KIT "
PACK | CUTANEOUS | Status: DC | PRN
  Administered 2021-04-07: 10 mL via TOPICAL

## 2021-04-07 MED ORDER — DEXAMETHASONE SODIUM PHOSPHATE 10 MG/ML IJ SOLN
INTRAMUSCULAR | Status: AC
  Filled 2021-04-07: qty 1

## 2021-04-07 MED ORDER — VASOPRESSIN 20 UNIT/ML IV SOLN
INTRAVENOUS | Status: DC | PRN
  Administered 2021-04-07 (×3): 2 [IU] via INTRAVENOUS
  Administered 2021-04-07: 1 [IU] via INTRAVENOUS

## 2021-04-07 MED ORDER — FENTANYL 2500MCG IN NS 250ML (10MCG/ML) PREMIX INFUSION
INTRAVENOUS | Status: AC
  Administered 2021-04-07: 50 ug/h via INTRAVENOUS
  Filled 2021-04-07: qty 250

## 2021-04-07 MED ORDER — SODIUM CHLORIDE 0.9 % IV SOLN
100.0000 mg | INTRAVENOUS | Status: DC
  Administered 2021-04-08 – 2021-04-11 (×4): 100 mg via INTRAVENOUS
  Filled 2021-04-07 (×7): qty 100

## 2021-04-07 MED ORDER — ALBUTEROL SULFATE (2.5 MG/3ML) 0.083% IN NEBU
2.5000 mg | INHALATION_SOLUTION | Freq: Four times a day (QID) | RESPIRATORY_TRACT | Status: DC
  Administered 2021-04-07 – 2021-04-12 (×19): 2.5 mg via RESPIRATORY_TRACT
  Filled 2021-04-07 (×19): qty 3

## 2021-04-07 SURGICAL SUPPLY — 58 items
BULB RESERV EVAC DRAIN JP 100C (MISCELLANEOUS) ×6 IMPLANT
CANISTER WOUND CARE 500ML ATS (WOUND CARE) ×3 IMPLANT
CHLORAPREP W/TINT 26 (MISCELLANEOUS) ×3 IMPLANT
CLIP VESOCCLUDE MED 6/CT (CLIP) ×3 IMPLANT
DRAIN CHANNEL JP 19F (MISCELLANEOUS) ×6 IMPLANT
DRAPE LAPAROTOMY 100X77 ABD (DRAPES) ×3 IMPLANT
DRAPE UTILITY 15X26 TOWEL STRL (DRAPES) ×3 IMPLANT
DRSG TEGADERM 4X4.75 (GAUZE/BANDAGES/DRESSINGS) ×6 IMPLANT
DRSG TELFA 4X14 ISLAND NADH (GAUZE/BANDAGES/DRESSINGS) IMPLANT
DRSG TELFA 4X8 ISLAND PHMB (GAUZE/BANDAGES/DRESSINGS) IMPLANT
DRSG VAC ATS LRG SENSATRAC (GAUZE/BANDAGES/DRESSINGS) ×3 IMPLANT
ELECT CAUTERY BLADE TIP 2.5 (TIP) ×3
ELECT EZSTD 165MM 6.5IN (MISCELLANEOUS) ×3
ELECT REM PT RETURN 9FT ADLT (ELECTROSURGICAL) ×3
ELECTRODE CAUTERY BLDE TIP 2.5 (TIP) ×2 IMPLANT
ELECTRODE EZSTD 165MM 6.5IN (MISCELLANEOUS) ×2 IMPLANT
ELECTRODE REM PT RTRN 9FT ADLT (ELECTROSURGICAL) ×2 IMPLANT
GAUZE 4X4 16PLY ~~LOC~~+RFID DBL (SPONGE) ×3 IMPLANT
GLOVE SURG SYN 6.5 ES PF (GLOVE) ×3 IMPLANT
GLOVE SURG UNDER LTX SZ7 (GLOVE) ×3 IMPLANT
GLOVE SURG UNDER POLY LF SZ7 (GLOVE) ×3 IMPLANT
GOWN STRL REUS W/ TWL LRG LVL3 (GOWN DISPOSABLE) ×4 IMPLANT
GOWN STRL REUS W/TWL LRG LVL3 (GOWN DISPOSABLE) ×2
HEMOSTAT SNOW SURGICEL 2X4 (HEMOSTASIS) ×3 IMPLANT
HEMOSTAT SURGICEL 2X14 (HEMOSTASIS) ×3 IMPLANT
KIT OSTOMY DRAINABLE 2.75 STR (WOUND CARE) ×3 IMPLANT
KIT TURNOVER KIT A (KITS) ×3 IMPLANT
LABEL OR SOLS (LABEL) ×3 IMPLANT
MANIFOLD NEPTUNE II (INSTRUMENTS) ×3 IMPLANT
NEEDLE HYPO 22GX1.5 SAFETY (NEEDLE) ×3 IMPLANT
NS IRRIG 1000ML POUR BTL (IV SOLUTION) ×24 IMPLANT
NS IRRIG 500ML POUR BTL (IV SOLUTION) ×3 IMPLANT
PACK BASIN MAJOR ARMC (MISCELLANEOUS) ×3 IMPLANT
PACK COLON CLEAN CLOSURE (MISCELLANEOUS) ×3 IMPLANT
RELOAD PROXIMATE 75MM BLUE (ENDOMECHANICALS) ×3 IMPLANT
RELOAD STAPLER LINE PROX 60 GR (STAPLE) ×2 IMPLANT
SPONGE DRAIN TRACH 4X4 STRL 2S (GAUZE/BANDAGES/DRESSINGS) ×6 IMPLANT
SPONGE T-LAP 18X18 ~~LOC~~+RFID (SPONGE) ×15 IMPLANT
STAPLER PROXIMATE 75MM BLUE (STAPLE) ×3 IMPLANT
STAPLER RELOAD LINE PROX 60 GR (STAPLE) ×3
STAPLER SKIN PROX 35W (STAPLE) ×3 IMPLANT
SUT ETHILON 3-0 FS-10 30 BLK (SUTURE) ×6
SUT PDS AB 1 CT1 36 (SUTURE) ×6 IMPLANT
SUT PDS AB 1 TP1 54 (SUTURE) ×6 IMPLANT
SUT PROLENE 2 0 SH DA (SUTURE) ×3 IMPLANT
SUT SILK 2 0 (SUTURE) ×1
SUT SILK 2-0 18XBRD TIE 12 (SUTURE) ×2 IMPLANT
SUT SILK 3 0 (SUTURE) ×1
SUT SILK 3-0 (SUTURE) ×3 IMPLANT
SUT SILK 3-0 18XBRD TIE 12 (SUTURE) ×2 IMPLANT
SUT VIC AB 3-0 SH 27 (SUTURE) ×4
SUT VIC AB 3-0 SH 27X BRD (SUTURE) ×8 IMPLANT
SUTURE EHLN 3-0 FS-10 30 BLK (SUTURE) ×4 IMPLANT
SYR 20ML LL LF (SYRINGE) IMPLANT
SYR 30ML LL (SYRINGE) IMPLANT
SYR BULB IRRIG 60ML STRL (SYRINGE) ×3 IMPLANT
TAPE CLOTH SURG 4X10 WHT LF (GAUZE/BANDAGES/DRESSINGS) ×6 IMPLANT
TAPE TRANSPORE STRL 2 31045 (GAUZE/BANDAGES/DRESSINGS) ×3 IMPLANT

## 2021-04-07 NOTE — ED Notes (Signed)
Report to ariel, rn.  

## 2021-04-07 NOTE — Consult Note (Addendum)
NAME:  Lindsay [name], MRN:  [id number], DOB:  1946-08-09, LOS: 0 ADMISSION DATE:  04/16/2021, CONSULTATION DATE:  05/01/2021 REFERRING MD:  Fredirick Maudlin, MD CHIEF COMPLAINT:  Severe sepsis, intra abdominal catastrophe   Note that the history of present illness is obtained from the available records and discussion with the referring physician and patient's husband.  History of Present Illness:  Ms. Lindsay [name] is a 75 year old lifelong never smoker with a history as noted below, who presented to [facility] today after a 5-day history of constant sharp and severe bilateral lower abdominal pain associated with daily episodes of nonbloody and nonbilious emesis.  The patient had not been able to take significant p.o. intake for approximately 5 days.  She endorsed to the emergency room physician that she had not made urine in 4 days PTA.  She presented to [facility] in the early hours of the morning feeling that she was in a pass out and feeling extremely weak.  She did not endorse any shortness of breath fevers or chills.  She stated that she was having bowel movements.  No constipation or diarrhea.  On evaluation at the emergency room the patient was noted to be tachypneic and described as being pale and sick appearing.  She was noted to have diffusely tender abdomen particular in the lower quadrants but described not to be having any rebound.  A CT abdomen and pelvis was obtained and this showed bowel perforation with marked inflammatory changes and severe pneumoperitoneum throughout the abdomen and pelvis.  Bowel ischemia could not be excluded.  Emergent surgical consultation was recommended.  This point Dr. Fredirick Maudlin was consulted emergently and the patient went emergently to the operating room.  Dr. Celine Ahr performed an exploratory laparotomy, mobilization of the splenic flexure, extensive adhesion lysis, low anterior resection, descending colostomy, left salpingo-oophorectomy and placement of a wound VAC.  Dr.  Celine Ahr described that all upon entering the abdomen roughly 1.5 L of pus were released.  There was no obvious feculent drainage.  During the procedure a small splenic laceration occur during the exploration which was managed with topical hemostatic agents.  Patient also had placement of multiple drains in the abdomen.  The patient had central line arterial line placed prior to surgery.  Patient received appropriate volume resuscitation with crystalloid and colloid and was transferred to the intensive care unit after the procedure and mechanically ventilated status.  She was at the time requiring pressors in the form of norepinephrine.  At that point PCCM was consulted for management of the patient's critical illness.  On review of the records through care everywhere it appears that the patient has been having issues with rectal bleeding and diarrhea since July 2021.  She has been followed at GI at [facility].  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  Subsequently she was seen at the GI clinic on 9 June concern for a sigmoid stricture she was placed on Keflex and Flagyl for her abscess and was scheduled to have a virtual visit with surgery for preoperative evaluation.  Per the patient's husband she was supposed to have surgery this coming week.  He was concerned however because he had not heard final word of scheduling.  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  On discussion with the patient's husband he was unaware that the patient had not been able to void for  4 days prior to admission.  He also stated that he tried to get the patient to seek medical attention but she was reluctant to come to the emergency room.  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  She has a history of A. fib and is on amiodarone and anticoagulation with Xarelto.  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 Fib Diverticulitis  Significant Hospital Events: Including procedures, antibiotic start and stop dates in addition to other pertinent events   04/28/2021 admitted post emergent ex lap for perforated viscus, peritonitis, severe sepsis 04/26/2021 piperacillin tazobactam and Eraxis initiated   Interim History / Subjective:  Returned from OR Patient sedated Will remain intubated Central line and A-line present Wound VAC present  Objective   Blood pressure (!) 157/58, pulse 74, temperature 98 F (36.7 C), temperature source Oral, resp. rate 16, height '5\' 5"'  (1.651 m), weight 63.5 kg, SpO2 99 %.    Vent Mode: PRVC FiO2 (%):  [50 %] 50 % Set Rate:  [16 bmp] 16 bmp Vt Set:  [450 mL] 450 mL PEEP:  [5 cmH20] 5 cmH20   Intake/Output Summary (Last 24 hours) at 04/24/2021 1257 Last data filed at 04/05/2021 1224 Gross per 24 hour  Intake 6550 ml  Output 1107 ml  Net 5443 ml   Filed Weights   04/12/2021 0310  Weight: 63.5 kg   Examination: GENERAL: Critically ill-appearing elderly woman, intubated, mechanically ventilated unresponsive HEAD: Normocephalic, atraumatic.  EYES: Pupils equal, round, reactive to light.  No scleral icterus.  MOUTH: ET tube in place, NG tube in place NECK: Supple. No thyromegaly. Trachea midline. No JVD.  No adenopathy. PULMONARY: Good air entry bilaterally.  No adventitious sounds.  Mechanically ventilated, synchronous. CARDIOVASCULAR: S1 and S2.  Irregular rate and rhythm.  Pacer/ICD left chest ABDOMEN: Midline wound VAC in  place, multiple drains in place, serosanguineous/sanguinous drainage MUSCULOSKELETAL: No joint deformity, no clubbing, no edema.  NEUROLOGIC: Sedated. SKIN: Intact, cool,dry.  Mottled over the knees, dusky fingernails. GU: Foley in place   Resolved Hospital Problem list   N/A  Assessment & Plan:   Acute ventilator dependent respiratory failure Patient status post emergent laparotomy Will remain mechanically ventilated due to hemodynamic instability and metabolic acidosis Ventilator/VAP bundle Wean FiO2 and PEEP as tolerated Chest x-ray and ABG Ventilator changes as necessary Maintain oxygen saturations 92% or better Bronchodilators for mucociliary clearance Sedation as needed for ventilator synchrony/comfort  Peritonitis with severe sepsis Bowel perforation Severe sepsis Status post emergent exploratory laparotomy Patient to ICU and mechanically ventilated status Severe sepsis due to the above Continue supportive care Volume resuscitate Pressors as needed to maintain MAP>65 Piperacillin tazobactam/Eraxis Follow cultures, trend procalcitonin Wound VAC management per general surgery  Acute renal failure due to ATN and volume depletion Severe volume depletion Volume resuscitate Maintain adequate perfusion Avoid nephrotoxins Renal dosing of all medications May need CRRT Currently no indication for dialysis Consult nephrology if no improvement  Lactic acidosis due to severe sepsis Volume resuscitate Maintain adequate perfusion Sodium bicarb infusion  Coagulopathy DIC likely Anticoagulated PTA Hold anticoagulants Transfuse FFP/cryo precipitate  as needed Supportive care Trend coags  Acute on chronic blood loss anemia Transfuse PRBC Follow H&H  History of hypertrophic cardiomyopathy Pacer AICD in situ Chronic atrial fibrillation Obtain 2D echo Continuous cardiac monitoring Amiodarone infusion if develops RVR Cardiology consultation as needed  Best  Practice (right click and "Reselect all SmartList Selections" daily)   Diet/type: NPO DVT prophylaxis: SCD GI prophylaxis: PPI Lines: Central line, Arterial Line, and yes and it is still needed Foley:  Yes, and it is still needed Code Status:  full code CCM prognosis: Exceedingly guarded Last date of multidisciplinary goals of care discussion [7/3]  Labs   CBC: Recent Labs  Lab 04/14/2021 0348  WBC 19.0*  HGB 12.0  HCT 38.3  MCV 79.6*  PLT 461*    Basic Metabolic Panel: Recent Labs  Lab 05/02/2021 0348  NA 128*  K 4.1  CL 96*  CO2 19*  GLUCOSE 146*  BUN 81*  CREATININE 4.81*  CALCIUM 8.7*   GFR: Estimated Creatinine Clearance: 9.1 mL/min (A) (by C-G formula based on SCr of 4.81 mg/dL (H)). Recent Labs  Lab 04/14/2021 0348 04/30/2021 0527 04/23/2021 0726  WBC 19.0*  --   --   LATICACIDVEN  --  2.8* 2.9*    Liver Function Tests: Recent Labs  Lab 04/30/2021 0348  AST 56*  ALT 41  ALKPHOS 70  BILITOT 0.9  PROT 6.6  ALBUMIN 2.6*   Recent Labs  Lab 04/28/2021 0348  LIPASE 41   No results for input(s): AMMONIA in the last 168 hours.  ABG    Component Value Date/Time   PHART 7.26 (L) 05/01/2021 1005   PCO2ART 37 04/24/2021 1005   PO2ART 399 (H) 04/24/2021 1005   HCO3 16.6 (L) 04/30/2021 1005   ACIDBASEDEF 9.7 (H) 05/02/2021 1005   O2SAT 100.0 04/16/2021 1005    Chest x-ray PA and lateral postop:  Coagulation Profile: Recent Labs  Lab 05/03/2021 1256  INR 3.3*    Cardiac Enzymes: No results for input(s): CKTOTAL, CKMB, CKMBINDEX, TROPONINI in the last 168 hours.  HbA1C: No results found for: HGBA1C  CBG: Recent Labs  Lab 04/09/2021 1253  GLUCAP 133*    Review of Systems:   Patient is unable to provide review of systems due to intubated status, severe sepsis and severe critical illness.  Past Medical History:   Persistent atrial fibrillation (CMS-HCC)   Supraventricular tachycardia (CMS-HCC)   ICD (implantable cardioverter-defibrillator),  dual, in situ   Hypertrophic cardiomyopathy (CMS-HCC)   Type 2 diabetes mellitus with other specified complication (CMS-HCC)   Hypertension associated with type 2 diabetes mellitus (CMS-HCC)   Fitting and adjustment of automatic implantable cardioverter-defibrillator   Osteopenia   Hyperlipidemia associated with type 2 diabetes mellitus (CMS-HCC)   Multinodular goiter   Status post ablation of atrial fibrillation   Benign liver cyst   Tubular adenoma   Medicare annual wellness visit, initial   History of amiodarone therapy   PONV (postoperative nausea and vomiting)   GERD (gastroesophageal reflux disease) Multinodular goiter Sleep apnea Squamous cell skin cancer  Surgical History:   Procedure Laterality Date   ABLATION ARRYTHMIA FOCUS Left 05/22/2016  cryoablation afib   back surgery N/A 09/21/1990  laser procedure for herniated disc   Bladder suspension N/A 08/2008   BLEPHAROPLASTY UPPER EYELID Bilateral 04/2007   BREAST EXCISIONAL BIOPSY Right 12/11/2017  calcifications on mammogram; pathology consistent with fibroadenoma   CARDIAC CATHETERIZATION 2017  Cryoabalation   CARDIAC DEFIBRILLATOR PLACEMENT Left 12/28/2012   CHOLECYSTECTOMY N/A 03/31/1988  CHOLECYSTECTOMY 1989   COLONOSCOPY W/BIOPSY N/A 01/07/2021  Procedure: Colonoscopy; Surgeon: Cora Daniels, MD; Location: [facility]; Service: Gastroenterology; Laterality: N/A;   DILATION & CURRETTAGE N/A 11/23/1986   ESOPHAGOGASTRODOUDENOSCOPY W/BIOPSY N/A 01/07/2021  Procedure: EGD - Upper Endoscopy; Surgeon: Cora Daniels, MD; Location: [place]; Service: Gastroenterology; Laterality: N/A;   eyebrow scar revision 02/07/2010   FNA of thyroid 09/09/2011  benign   hemorrhoid banding N/A  09/21/2017, 10/05/2017 & 10/19/2017   INSERT / REPLACE / REMOVE PACEMAKER Left  dual chamber ICD primary prevention   LAPAROSCOPIC TUBAL LIGATION Bilateral 09/24/1983   REDUCTION MAMMAPLASTY  Bilateral 02/07/2010   TMJ surgery Bilateral 04/03/1992   TUBAL LIGATION 1983  Social History:   Tobacco Use   Smoking status: Never   Smokeless tobacco: Never  Substance Use Topics   Alcohol use: Never  Married, lives with husband in retirement community.  Moved from [place] 06/17/2019.  Worked as Designer, television/film set, now retired.  Family History:   Breast cancer Mother   Transient ischemic attack Mother   High blood pressure (Hypertension) Mother   Heart failure Mother   Heart murmur Mother   Lung cancer Father   High blood pressure (Hypertension) Father   Myocardial Infarction (Heart attack) Father   Other Brother  S/P partial colectomy for blockage   Cancer Maternal Uncle  bladder   Colon cancer Neg Hx    Allergies Allergies  Allergen Reactions   Amiodarone     Other reaction(s): Other (See Comments) Nausea, diarrhea, weight loss, anxiety, jitteriness   Levofloxacin Rash    Red palms, lips, forearms    Meloxicam Shortness Of Breath    Other reaction(s): Respiratory Distress   Amlodipine     Other reaction(s): Other (See Comments) Caused severe gingival hyperplasia   Amlodipine Besy-Benazepril Hcl     Severe gingival hyperplasia  Other reaction(s): Other (See Comments) Other reaction(s): Other   Beta Adrenergic Blockers     States severe fatigue  Other reaction(s): Other (See Comments) fatigue     Home Medications  Prior to Admission medications   Medication Sig Start Date End Date Taking? Authorizing Provider  acetaminophen (TYLENOL) 500 MG tablet Take 500 mg by mouth every 6 (six) hours as needed.   Yes [provider]  Alum Hydroxide-Mag Carbonate (GAVISCON PO) Take 5-10 mLs by mouth 3 (three) times daily as needed. Plus or minus 3 hours from metoprolol succinate er   Yes [provider]  Cyanocobalamin (VITAMIN B12 PO) Take 1,000 mcg by mouth daily.   Yes [provider]  esomeprazole (NEXIUM) 20  MG capsule Take 20 mg by mouth daily at 12 noon.   Yes [provider]  losartan (COZAAR) 50 MG tablet Take 50 mg by mouth 2 (two) times daily. 04/30/20  Yes [provider]  metoprolol succinate (TOPROL-XL) 100 MG 24 hr tablet Take 100 mg by mouth 2 (two) times daily. 05/27/20  Yes [provider]  Multiple Vitamins tablet Take 2 tablets by mouth daily. 01/26/20  Yes [provider]  rosuvastatin (CRESTOR) 10 MG tablet Take 10 mg by mouth at bedtime. 04/28/20  Yes [provider]  XARELTO 20 MG TABS tablet Take 20 mg by mouth daily. 04/28/20  Yes [provider]  amiodarone (PACERONE) 200 MG tablet Take by mouth. Patient not taking: No sig reported 05/27/20   [provider]  amoxicillin-clavulanate (AUGMENTIN) 875-125 MG tablet Take 1 tablet by mouth 2 (two) times  daily. Patient not taking: No sig reported    [provider]  aspirin 81 MG EC tablet Aspir-81 mg tablet,delayed release  Take 1 tablet every day by oral route. Patient not taking: No sig reported    [provider]  beta carotene w/minerals (OCUVITE) tablet Take 1 tablet by mouth daily. Patient not taking: No sig reported 01/26/20   [provider]  calcium citrate-vitamin D (CITRACAL+D) 315-200 MG-UNIT tablet Take by mouth. Patient not taking: No sig reported    [provider]  ketoconazole (NIZORAL) 2 % shampoo Shampoo into the scalp let sit 5-10 minutes then wash out. Use 3d/wk. Patient not taking: Reported on 04/18/2021 02/25/21   Ralene Bathe, MD  MAGNESIUM OXIDE PO Take 165 mg by mouth daily. Patient not taking: Reported on 04/26/2021    [provider]  mupirocin ointment (BACTROBAN) 2 % Apply to ulceration QD until healed. Patient not taking: Reported on 04/18/2021 02/25/21   Ralene Bathe, MD    Medications ordered: Scheduled Meds:  sodium chloride   Intravenous Once   albuterol  2.5 mg Nebulization Q6H    chlorhexidine gluconate (MEDLINE KIT)  15 mL Mouth Rinse BID   [START ON 04/08/2021] Chlorhexidine Gluconate Cloth  6 each Topical Q0600   mouth rinse  15 mL Mouth Rinse 10 times per day   pantoprazole (PROTONIX) IV  40 mg Intravenous Daily   Continuous Infusions:  sodium chloride 125 mL/hr at 04/10/2021 1800   [START ON 04/08/2021] anidulafungin     fentaNYL infusion INTRAVENOUS 50 mcg/hr (05/01/2021 1848)   norepinephrine (LEVOPHED) Adult infusion 2 mcg/min (04/23/2021 1828)   piperacillin-tazobactam (ZOSYN)  IV Stopped (04/29/2021 1403)   propofol (DIPRIVAN) infusion 20 mcg/kg/min (04/11/2021 1800)   sodium bicarbonate in 0.45 NS mL infusion     PRN Meds:.midazolam   Critical care time: 90 minutes    The patient is critically ill with multiple organ systems failure and requires high complexity decision making for assessment and support, frequent evaluation and titration of therapies, application of advanced monitoring technologies and extensive interpretation of multiple databases.   Updated patient's husband at bedside, discussed guarded prognosis.    Renold Don, MD [place]   *This note was dictated using voice recognition software/Dragon.  Despite best efforts to proofread, errors can occur which can change the meaning.  Any change was purely unintentional.

## 2021-04-07 NOTE — Op Note (Signed)
Operative Note  Preoperative Diagnosis: Perforated viscus  Postoperative Diagnosis: Same  Operation: Exploratory laparotomy, mobilization of the splenic flexure, extensive adhesiolysis, low anterior resection, descending colostomy, left salpingo-oophorectomy, placement of wound VAC measuring 24 x 5 x 4 cm  Surgeon: Fredirick Maudlin, MD  Assistant: None  Anesthesia: General endotracheal  Findings: The abdomen was full of frank pus, roughly 1.5 L.  No obvious feculent drainage.  Multiple loops of bowel were adhered to each other creating interloop abscesses.  All of these were lysed in order to drain and aspirate all of the pus.  The sigmoid colon was markedly abnormal, showing evidence of what appeared to be multiple episodes of prior inflammation.  The left tube and ovary were completely plastered to the rectosigmoid tissues and could not be separated and therefore they were sacrificed as part of the operation.  There was a small splenic laceration incurred during the exploration which was managed with topical hemostatic agents.  Indications: This is a 75 year old Lindsay [name] who presented to the emergency department with a 5-day history of severe abdominal pain, nausea, vomiting, poor oral intake and lack of urine output.  Evaluation was consistent with a perforated viscus.  Due to her severe sepsis, she was taken emergently to the operating room for exploratory laparotomy.  Procedure In Detail: The patient was identified in the holding area and taken expeditiously to the operating room where she was placed supine on the OR table.  Bony prominences were padded and bilateral sequential compression devices were placed on the lower extremities.  General endotracheal anesthesia was induced without incident.  Monitoring lines and tubes were placed by the anesthesia team.  A Foley catheter was already in place from the emergency department.  The patient's abdomen was then sterilely prepped and draped in  standard fashion.  A timeout was performed confirming her identity, the procedure being performed, her allergies, all necessary equipment was available, and that maintenance anesthesia was adequate.  A generous midline laparotomy incision was made and carried down through the subcutaneous tissues to the fascia using a combination of fat splitting and electrocautery.  As the patient had a prior upper midline incision, I elected to enter the abdomen in the lower midline.  The fascia was incised and the peritoneum grasped.  It was opened sharply, releasing a significant amount of gas.  There were minimal adhesions at the superior aspect which were carefully lysed to avoid injury to bowel.  The incision was widely opened and the abdomen was explored.  All of the small intestine was plastered to itself creating multiple interloop abscesses.  Tedious and careful dissection was performed to free all of the adhesions and clear the abscesses.  I then began a formal abdominal exploration.  The stomach was inspected and found to be intact.  The nasogastric tube was confirmed to be in good position and was therefore secured by the anesthesia team.  The small bowel was then run from the ligament of Treitz to the terminal ileum.  Additional interloop abscesses were identified and evacuated.  The a sending colon was inspected as was the transverse.  They followed the descending colon down to the sigmoid, extensive and woody fibrosis was encountered.  The sigmoid colon was extremely fibrotic.  The uterus was stuck to the anterior surface of the rectosigmoid colon.  No frank site of perforation was identified, but given the condition of the colon at this location, I felt it was safe to presume that this is where the perforation had occurred.  The colon was mobilized by freeing the peritoneal reflection along the left colic gutter up to the splenic flexure which was then completely mobilized.  As this was being done, the spleen  incurred a small capsular laceration.  The area was packed with laparotomy sponges while we continued the rest of the operation.  I continued to mobilize the colon along the transverse until I had a long floppy segment.  I returned to the sigmoid and identified the most normal proximal tissue and made a small hole in the mesentery, dividing the colon with a standard blue load GIA stapler.  The mesentery was divided with the LigaSure.  Once I got down to the sigmoid, approximately 2 hours was required to free the sigmoid colon from the pelvic sidewall and uterus.  The left ovary and salpinx were completely plastered to the colonic tissues and could not be separated.  I elected to sacrifice these and divided the tissues with the LigaSure.  Continued to circumferentially dissect around the colon until I reached a softer area at the rectum, just below a large sigmoid outpouching.  A 60 mm green load TA stapler was used to divide the colon as low as possible at this site.  A 2-0 Prolene stitch was used to mark the stump.  The sigmoid and proximal rectal tissue was then handed off as a specimen.  Copious warm saline irrigation was used to irrigate the abdomen.  The bowel was run 1 more time to confirm no other potential sources of the perforation and none was seen.  All interloop abscesses appeared to have been drained.  The laceration on the spleen was evaluated.  It was oozing slightly.  This was treated with topical application of Surgicel, SNoW, and Vistaseal.  #19 round Blake drains were placed in the left upper quadrant and in the pelvis.  These were brought out through the right abdominal wall and secured in place with 3-0 nylon suture.  I then created a circular incision in the skin of the left abdominal wall overlying the rectus muscle.  This was carried down to the fascia which was then incised in a cruciate fashion.  The opening was dilated to permit passage of 3 of the operating surgeon's fingers.  The distal  transected colon was then brought through the defect.  Several interrupted Vicryl sutures were used to tack the colon to the underside of the abdominal wall.  Additional sutures secured the colon to the anterior fascia.  The wound was irrigated 1 more time and then the fascia of the midline wound was closed with running #1 PDS.  The stoma was then matured in standard fashion.  An ostomy device was placed.  Due to the degree of infection, I elected not to close the skin.  A wound VAC was placed into the midline wound which measured 24 cm craniocaudal 5 cm transverse and 4 cm deep.  Good suction was obtained.  At that point, we terminated the procedure and the patient was taken to the intensive care unit for ongoing monitoring and care.  EBL: 900 cc  IVF: 3600 cc of crystalloid and 1 L of 5% albumin  Specimen(s): Rectosigmoid colon  Complications: Small laceration to the splenic capsule, controlled with topical hemostatic agents.  Counts: all needles, instruments, and sponges were counted and reported to be correct in number at the end of the case.   I was present for and participated in the entire operation.  Fredirick Maudlin 5:48 PM

## 2021-04-07 NOTE — ED Notes (Signed)
Pt placed on bedpan to try to have bowel movement

## 2021-04-07 NOTE — ED Triage Notes (Signed)
Patient reports having nausea and weakness since Wednesday.

## 2021-04-07 NOTE — Anesthesia Postprocedure Evaluation (Signed)
Anesthesia Post Note  Patient: Lindsay [name]  Procedure(s) Performed: EXPLORATORY LAPAROTOMY WITH LOWER ANTERIOR RESECTION (Abdomen) COLOSTOMY (Left: Abdomen) APPLICATION OF WOUND VAC (Abdomen) OPEN SALPINGO OOPHORECTOMY (Left: Abdomen) LYSIS OF ADHESION (Abdomen)  Patient location during evaluation: ICU Anesthesia Type: General Level of consciousness: sedated Pain management: pain level controlled Vital Signs Assessment: post-procedure vital signs reviewed and stable Respiratory status: patient on ventilator - see flowsheet for VS Cardiovascular status: stable Anesthetic complications: no Comments: Pt remains intubated in ICU.  Pt s/p 2U of prbc for anemia post surgery. Pt remains in critical condition.  No anesthetic issues   No notable events documented.   Last Vitals:  Vitals:   04/09/2021 1800 04/22/2021 1815  BP: (!) 100/43 (!) 106/54  Pulse: (!) 102 68  Resp: 20 20  Temp:    SpO2: 99% 98%    Last Pain:  Vitals:   04/08/2021 1645  TempSrc: Axillary  PainSc:                  Harrie Foreman

## 2021-04-07 NOTE — ED Notes (Signed)
Pt states she has not had any urine output in 4 days. Pt complains of nausea, vomiting, rectal bleeding. Pt is pale, clammy skin noted. Pt alert and oriented x4, large scar noted to abd wall.

## 2021-04-07 NOTE — ED Triage Notes (Addendum)
Pt arrived via POV with reports of nausea and weakness since Wednesday, pt had recent GEG at [facility]. Pt reports decreased urination over the past 4 days, pt is pale. Pt c/o RLQ and LLQ abd pain. Dry heaving in triage.   Dx with sigmoid stricture

## 2021-04-07 NOTE — ED Provider Notes (Signed)
[facility] Emergency Department Provider Note  ____________________________________________  Time seen: Approximately 5:05 AM  I have reviewed the triage vital signs and the nursing notes.   HISTORY  Chief Complaint Nausea, Weakness, and Rectal Bleeding   HPI Lindsay [name] is a 75 y.o. female with a history of diabetes, hypertension, diastolic CHF, atrial fibrillation, VF  status post AICD, on Xarelto, GERD, diverticular disease, sigmoid stricture who presents for evaluation of near syncope, nausea and vomiting.  Patient reports that for the last 5 days she has had constant sharp severe bilateral lower abdominal pain associated with several daily episodes of nonbloody nonbilious emesis.  Has not made any urine in 4 days.  Has not been able to keep anything down in 5 days.  Today felt like she was going to pass out.  She is feeling extremely weak.  Denies any chest pain or shortness of breath, denies fever or chills.  Has been moving her bowels.  Denies constipation or diarrhea.   Past Medical History:  Diagnosis Date   Diabetes mellitus without complication ([place])    Hypertension     Patient Active Problem List   Diagnosis Date Noted   Defibrillator discharge 05/09/2020   Atrial fibrillation ([place]) 05/09/2020   Anticoagulated 12/30/2019   ICD (implantable cardioverter-defibrillator), dual, in situ 07/26/2019   Gastro-esophageal reflux disease with esophagitis 12/30/2017    Past Surgical History:  Procedure Laterality Date   BREAST BIOPSY Right    2019-benign   BREAST EXCISIONAL BIOPSY Right 2018   REDUCTION MAMMAPLASTY Bilateral    2010    Prior to Admission medications   Medication Sig Start Date End Date Taking? Authorizing Provider  acetaminophen (TYLENOL) 500 MG tablet Take 500 mg by mouth every 6 (six) hours as needed.   Yes [provider]  Alum Hydroxide-Mag Carbonate (GAVISCON PO) Take 5-10 mLs by mouth 3 (three) times daily as  needed. Plus or minus 3 hours from metoprolol succinate er   Yes [provider]  Cyanocobalamin (VITAMIN B12 PO) Take 1,000 mcg by mouth daily.   Yes [provider]  esomeprazole (NEXIUM) 20 MG capsule Take 20 mg by mouth daily at 12 noon.   Yes [provider]  losartan (COZAAR) 50 MG tablet Take 50 mg by mouth 2 (two) times daily. 04/30/20  Yes [provider]  metoprolol succinate (TOPROL-XL) 100 MG 24 hr tablet Take 100 mg by mouth 2 (two) times daily. 05/27/20  Yes [provider]  Multiple Vitamins tablet Take 2 tablets by mouth daily. 01/26/20  Yes [provider]  rosuvastatin (CRESTOR) 10 MG tablet Take 10 mg by mouth at bedtime. 04/28/20  Yes [provider]  XARELTO 20 MG TABS tablet Take 20 mg by mouth daily. 04/28/20  Yes [provider]  amiodarone (PACERONE) 200 MG tablet Take by mouth. Patient not taking: No sig reported 05/27/20   [provider]  amoxicillin-clavulanate (AUGMENTIN) 875-125 MG tablet Take 1 tablet by mouth 2 (two) times daily. Patient not taking: No sig reported    [provider]  aspirin 81 MG EC tablet Aspir-81 mg tablet,delayed release  Take 1 tablet every day by oral route. Patient not taking: No sig reported    [provider]  beta carotene w/minerals (OCUVITE) tablet Take 1 tablet by mouth daily. Patient not taking: No sig reported 01/26/20   [provider]  calcium citrate-vitamin D (CITRACAL+D) 315-200 MG-UNIT tablet Take by mouth. Patient not taking: No sig reported  [provider]  ketoconazole (NIZORAL) 2 % shampoo Shampoo into the scalp let sit 5-10 minutes then wash out. Use 3d/wk. Patient not taking: Reported on 04/09/2021 02/25/21   Ralene Bathe, MD  MAGNESIUM OXIDE PO Take 165 mg by mouth daily. Patient not taking: Reported on 04/21/2021    [provider]  mupirocin ointment (BACTROBAN) 2 % Apply to ulceration QD until  healed. Patient not taking: Reported on 04/12/2021 02/25/21   Ralene Bathe, MD    Allergies Amiodarone, Levofloxacin, Meloxicam, Amlodipine, Amlodipine besy-benazepril hcl, and Beta adrenergic blockers  Family History  Problem Relation Age of Onset   Breast cancer Mother     Social History Social History   Tobacco Use   Smoking status: Never   Smokeless tobacco: Never  Substance Use Topics   Alcohol use: Never   Drug use: Never    Review of Systems  Constitutional: Negative for fever. + near syncope Eyes: Negative for visual changes. ENT: Negative for sore throat. Neck: No neck pain  Cardiovascular: Negative for chest pain. Respiratory: Negative for shortness of breath. Gastrointestinal: + abdominal pain, nausea, and vomiting. No diarrhea. Genitourinary: Negative for dysuria. Musculoskeletal: Negative for back pain. Skin: Negative for rash. Neurological: Negative for headaches, weakness or numbness. Psych: No SI or HI  ____________________________________________   PHYSICAL EXAM:  VITAL SIGNS: ED Triage Vitals  Enc Vitals Group     BP 05/02/2021 0310 118/67     Pulse --      Resp 04/17/2021 0310 16     Temp 04/21/2021 0310 98 F (36.7 C)     Temp Source 04/29/2021 0310 Oral     SpO2 05/05/2021 0310 100 %     Weight 04/05/2021 0310 140 lb (63.5 kg)     Height 04/26/2021 0310 5\' 5"  (1.651 m)     Head Circumference --      Peak Flow --      Pain Score 04/13/2021 0311 7     Pain Loc --      Pain Edu? --      Excl. in [place]? --     Constitutional: Alert and oriented, pale, sick appearing. HEENT:      Head: Normocephalic and atraumatic.         Eyes: Conjunctivae are normal. Sclera is non-icteric.       Mouth/Throat: Mucous membranes are moist.       Neck: Supple with no signs of meningismus. Cardiovascular: Regular rate and rhythm. No murmurs, gallops, or rubs. 2+ symmetrical distal pulses are present in all extremities. No JVD. Respiratory: Tachypneic with normal sats  and clear lungs on auscultation Gastrointestinal: Soft, diffusely tender in the lower quadrants with no rebound or guard . Musculoskeletal:  No edema, cyanosis, or erythema of extremities. Neurologic: Normal speech and language. Face is symmetric. Moving all extremities. No gross focal neurologic deficits are appreciated. Skin: Skin is warm, dry and intact. No rash noted. Psychiatric: Mood and affect are normal. Speech and behavior are normal.  ____________________________________________   LABS (all labs ordered are listed, but only abnormal results are displayed)  Labs Reviewed  COMPREHENSIVE METABOLIC PANEL - Abnormal; Notable for the following components:      Result Value   Sodium 128 (*)    Chloride 96 (*)    CO2 19 (*)    Glucose, Bld 146 (*)    BUN 81 (*)    Creatinine, Ser 4.81 (*)    Calcium 8.7 (*)    Albumin 2.6 (*)  AST 56 (*)    GFR, Estimated 9 (*)    All other components within normal limits  CBC - Abnormal; Notable for the following components:   WBC 19.0 (*)    MCV 79.6 (*)    MCH 24.9 (*)    RDW 17.5 (*)    Platelets 461 (*)    All other components within normal limits  LACTIC ACID, PLASMA - Abnormal; Notable for the following components:   Lactic Acid, Venous 2.8 (*)    All other components within normal limits  RESP PANEL BY RT-PCR (FLU A&B, COVID) ARPGX2  CULTURE, BLOOD (ROUTINE X 2)  CULTURE, BLOOD (ROUTINE X 2)  LIPASE, BLOOD  URINALYSIS, COMPLETE (UACMP) WITH MICROSCOPIC  LACTIC ACID, PLASMA  TYPE AND SCREEN   ____________________________________________  EKG  ED ECG REPORT I, Rudene Re, the attending physician, personally viewed and interpreted this ECG.  Sinus rhythm with PVCs and PACs, LAFB, prolonged QTC of 510, inferior and lateral Q waves with no ST elevation.  Pretty similar when compared to prior. ____________________________________________  RADIOLOGY  I have personally reviewed the images performed during this visit  and I agree with the Radiologist's read.   Interpretation by Radiologist:  CT ABDOMEN PELVIS WO CONTRAST  Result Date: 05/01/2021 CLINICAL DATA:  Nonlocalized acute abdominal pain. Nausea and weakness. Decreased urination over the past 4 days EXAM: CT ABDOMEN AND PELVIS WITHOUT CONTRAST TECHNIQUE: Multidetector CT imaging of the abdomen and pelvis was performed following the standard protocol without IV contrast. COMPARISON:  None. FINDINGS: Lower chest: No acute abnormality. Hepatobiliary: Several similar-appearing fluid density lesions are noted within the liver with the largest measuring up to 2.3 cm. Findings likely represent hepatic cysts. Subcentimeter hypodensities are too small to characterize. No new focal lesion. No gallstones, gallbladder wall thickening, or pericholecystic fluid. No biliary dilatation. Pancreas: No focal lesion. Normal pancreatic contour. No surrounding inflammatory changes. No main pancreatic ductal dilatation. Spleen: Normal in size without focal abnormality. Adrenals/Urinary Tract: No adrenal nodule bilaterally. No nephrolithiasis, no hydronephrosis, and no contour-deforming renal mass. No ureterolithiasis or hydroureter. The urinary bladder is decompressed with a urinary Foley catheter terminating within the lumen. Several foci of gas are noted within the urinary bladder lumen as well as within the Foley catheter. Stomach/Bowel: Stomach is within normal limits. Irregular bowel wall and haziness throughout the abdomen pelvis. Question scattered colonic diverticula of the skin colon. No definite bowel dilatation. The appendix not definitely identified. Vascular/Lymphatic: No abdominal aorta or iliac aneurysm. Severe atherosclerotic plaque of the aorta and its branches. No abdominal, pelvic, or inguinal lymphadenopathy. Reproductive: Uterus and bilateral adnexa are unremarkable. Other: Large volume pneumoperitoneum with at least small volume free fluid mesenteric fat stranding  throughout the abdomen and pelvis. Musculoskeletal: No abdominal wall hernia or abnormality. No suspicious lytic or blastic osseous lesions. No acute displaced fracture. Multilevel degenerative changes of the spine. IMPRESSION: 1. Bowel perforation with marked inflammatory changes and pneumoperitoneum throughout the abdomen and pelvis. Bowel ischemia not excluded. Markedly limited evaluation on this noncontrast study. Recommend emergent surgical consultation. 2. Multiple foci of gas noted within the urinary bladder lumen and Foley catheter tubing. Correlate with urinalysis to exclude infection. These results were called by telephone at the time of interpretation on 04/06/2021 at 5:19 am to provider [facility] , who verbally acknowledged these results. Electronically Signed   By: Iven Finn M.D.   On: 04/17/2021 05:28     ____________________________________________   PROCEDURES  Procedure(s) performed:yes .1-3 Lead EKG Interpretation  Date/Time: 04/25/2021 5:10 AM Performed by: Rudene Re, MD Authorized by: Rudene Re, MD     Interpretation: abnormal     ECG rate assessment: normal     Rhythm: sinus rhythm     Ectopy: PAC and PVCs     Conduction: abnormal     Critical Care performed: yes  CRITICAL CARE Performed by: Rudene Re  ?  Total critical care time: 30 min  Critical care time was exclusive of separately billable procedures and treating other patients.  Critical care was necessary to treat or prevent imminent or life-threatening deterioration.  Critical care was time spent personally by me on the following activities: development of treatment plan with patient and/or surrogate as well as nursing, discussions with consultants, evaluation of patient's response to treatment, examination of patient, obtaining history from patient or surrogate, ordering and performing treatments and interventions, ordering and review of laboratory studies, ordering  and review of radiographic studies, pulse oximetry and re-evaluation of patient's condition.  ____________________________________________   INITIAL IMPRESSION / ASSESSMENT AND PLAN / ED COURSE   75 y.o. female with a history of diabetes, hypertension, diastolic CHF, atrial fibrillation, VF  status post AICD, on Xarelto, GERD, diverticular disease, sigmoid stricture who presents for evaluation of near syncope, nausea and vomiting.  Patient is unwell appearing, pale, looks extremely dry on exam, diffuse lower quadrant tenderness with no rebound or guarding.  EKG no significant changes when compared to baseline.  Labs showing several significant abnormalities including WBC of 19, platelets of 461, sodium 128, creatinine of 4.81 (patient's baseline is 1).  CT abdomen pelvis pending  Patient started on 2 L LR bolus and maintenance fluids.  Foley catheter placed for strict I&o's.  Medical record review including patient's most recent echo showing normal EF done in 2021 and notes from her GI doctor for her upcoming surgery for sigmoid stricture.  Patient placed on telemetry for close monitoring of cardiorespiratory status.  _________________________ 5:26 AM on 04/09/2021 ----------------------------------------- Spoke with radiologist on the phone who says patient has a bowel perforation and diffuse inflammation of her intestines on CT.  Zosyn ordered.  I discussed this finding with patient who is requesting to be transferred to [facility].  I had a long conversation with the patient that she is very sick for a transfer unless the transfer can happen immediately and due to helpful hospitals are that might not be possible.  I will try to contact [facility] and see if they have availability.  Otherwise she is okay staying in the hospital.  _________________________ 6:13 AM on 04/25/2021 ------------------------ Spoke with [facility] transfer center who informed me no beds available at [facility].  She will try to get with the  surgeon on the phone but has been an hour and a still have not heard back from them.  Went to reassess patient who is willing to stay here and be managed by our surgical team.  Will page surgery at this time  _________________________ 6:28 AM on 04/18/2021 ----------------------------------------- Spoke with Dr. Celine Ahr from surgery who will come to the ED to evaluate patient for emergent surgery. Patient's husband updated over the phone per request of the patient.   _________________________ 7:31 AM on 05/02/2021 ----------------------------------------- Spoke with Dr. Rolena Infante from general surgery at [facility] who confirms that they have no bed.  Patient was updated on this.  Patient is on her way to preop now  _____________________________________________ Please note:  Patient was evaluated in Emergency Department today for the symptoms described in the history  of present illness. Patient was evaluated in the context of the global COVID-19 pandemic, which necessitated consideration that the patient might be at risk for infection with the SARS-CoV-2 virus that causes COVID-19. Institutional protocols and algorithms that pertain to the evaluation of patients at risk for COVID-19 are in a state of rapid change based on information released by regulatory bodies including the CDC and federal and state organizations. These policies and algorithms were followed during the patient's care in the ED.  Some ED evaluations and interventions may be delayed as a result of limited staffing during the pandemic.   [place] Controlled Substance Database was reviewed by me. ____________________________________________   FINAL CLINICAL IMPRESSION(S) / ED DIAGNOSES   Final diagnoses:  Acute renal failure, unspecified acute renal failure type ([place])  Hyponatremia  Bowel perforation ([facility])      NEW MEDICATIONS STARTED DURING THIS VISIT:  ED Discharge Orders     None        Note:  This document was prepared  using Dragon voice recognition software and may include unintentional dictation errors.    Alfred Levins, [place], MD 04/12/2021 [phone]

## 2021-04-07 NOTE — ED Notes (Signed)
Additional warm blankets provided. Call bell at right side.

## 2021-04-07 NOTE — ED Notes (Signed)
Pt had large bright red bowel movement in triage bathroom.

## 2021-04-07 NOTE — ED Notes (Signed)
Patient transported to CT 

## 2021-04-07 NOTE — Progress Notes (Signed)
Contacted by ED regarding this patient, floridly septic with AKI and massive pneumoperitoneum.  Need to proceed emergently to the OR due to severity of illness. ED has initiated antibiotics and fluid resuscitation.  OR contacted.

## 2021-04-07 NOTE — Anesthesia Procedure Notes (Signed)
Procedure Name: Intubation Date/Time: 04/05/2021 8:07 AM Performed by: Doreen Salvage, CRNA Pre-anesthesia Checklist: Patient identified, Emergency Drugs available, Suction available and Patient being monitored Patient Re-evaluated:Patient Re-evaluated prior to induction Oxygen Delivery Method: Circle system utilized Preoxygenation: Pre-oxygenation with 100% oxygen Induction Type: IV induction, Cricoid Pressure applied and Rapid sequence Ventilation: Mask ventilation without difficulty Laryngoscope Size: Mac, 3 and McGraph Grade View: Grade II Tube type: Oral Tube size: 7.0 mm Number of attempts: 1 Airway Equipment and Method: Stylet Placement Confirmation: ETT inserted through vocal cords under direct vision, positive ETCO2 and breath sounds checked- equal and bilateral Secured at: 22 cm Tube secured with: Tape Dental Injury: Teeth and Oropharynx as per pre-operative assessment

## 2021-04-07 NOTE — ED Notes (Signed)
Critical lactic of 2.8 called from lab. Dr. Alfred Levins notified, no new verbal orders received.

## 2021-04-07 NOTE — ED Notes (Signed)
Pt taken for surgery.

## 2021-04-07 NOTE — Transfer of Care (Signed)
Immediate Anesthesia Transfer of Care Note  Patient: Lindsay [name]  Procedure(s) Performed: EXPLORATORY LAPAROTOMY WITH LOWER ANTERIOR RESECTION (Abdomen) COLOSTOMY (Left: Abdomen) APPLICATION OF WOUND VAC (Abdomen) OPEN SALPINGO OOPHORECTOMY (Left: Abdomen) LYSIS OF ADHESION (Abdomen)  Patient Location: PACU  Anesthesia Type:General  Level of Consciousness: Patient remains intubated per anesthesia plan  Airway & Oxygen Therapy: Patient remains intubated per anesthesia plan  Post-op Assessment: Report given to RN  Post vital signs: Reviewed and stable  Last Vitals:  Vitals Value Taken Time  BP    Temp    Pulse 93 04/17/2021 1243  Resp 16 04/12/2021 1243  SpO2 99 % 04/05/2021 1243  Vitals shown include unvalidated device data.  Last Pain:  Vitals:   04/06/2021 0311  TempSrc:   PainSc: 7          Complications: No notable events documented.

## 2021-04-07 NOTE — ED Notes (Signed)
Clothing removed, belongings at bedside. Pt is wearing bilateral ankle bracelets.

## 2021-04-07 NOTE — ED Notes (Signed)
Lab at bedside

## 2021-04-07 NOTE — Anesthesia Preprocedure Evaluation (Addendum)
Anesthesia Evaluation   Patient awake    Airway Mallampati: II  TM Distance: >3 FB Neck ROM: Full    Dental   Pulmonary           Cardiovascular hypertension, + dysrhythmias Atrial Fibrillation and Ventricular Tachycardia + Cardiac Defibrillator (for VT)  Rhythm:Irregular     Neuro/Psych    GI/Hepatic Bowel perforation with free air;  CT scan abdomen 05/04/2021 IMPRESSION: 1. Bowel perforation with marked inflammatory changes and pneumoperitoneum throughout the abdomen and pelvis. Bowel ischemia not excluded. Markedly limited evaluation on this noncontrast study. Recommend emergent surgical consultation.   Endo/Other  diabetes  Renal/GU ARFRenal disease     Musculoskeletal   Abdominal   Peds  Hematology   Anesthesia Other Findings   Reproductive/Obstetrics                           Anesthesia Physical Anesthesia Plan  ASA: 4 and emergent  Anesthesia Plan: General   Post-op Pain Management:    Induction: Intravenous  PONV Risk Score and Plan: 2 and Dexamethasone and Ondansetron  Airway Management Planned:   Additional Equipment: Arterial line  Intra-op Plan:   Post-operative Plan:   Informed Consent:   Plan Discussed with: CRNA  Anesthesia Plan Comments: (Pt with 5 days of abdominal pain and dehydration 2/2 vomiting and poor po intake.  Pt with AKI and perforated bowel with +lactate at 2.8.  Plan GA w/ ETT.  Arterial line and +/- central line depending on access.)       Anesthesia Quick Evaluation

## 2021-04-07 NOTE — Anesthesia Procedure Notes (Signed)
Central Venous Catheter Insertion Performed by: Harrie Foreman, MD, anesthesiologist Start/End07/28/2022 8:16 AM, 04/21/2021 8:21 AM Preanesthetic checklist: patient identified, IV checked, site marked, risks and benefits discussed, surgical consent, monitors and equipment checked, pre-op evaluation, timeout performed and anesthesia consent Position: Trendelenburg Hand hygiene performed  and maximum sterile barriers used  Catheter size: 7 Fr Central line was placed.Procedure performed using ultrasound guided technique. Ultrasound Notes:anatomy identified and image(s) printed for medical record Following insertion, dressing applied, line sutured and Biopatch. Post procedure assessment: blood return through all ports  Patient tolerated the procedure well with no immediate complications.

## 2021-04-07 NOTE — Consult Note (Signed)
Reason for Consult: Perforated viscus Referring Physician: Rudene Re, MD (emergency medicine)  Lindsay [name] is an 75 y.o. female.  HPI: (Note: The history of present illness was obtained from the electronic medical record and from Dr. Alfred Levins, as the patient was critically ill and we were proceeding directly to the operating room based upon her imaging findings.  This note is a late entry following emergent exploratory laparotomy.)  She presented to the emergency department after roughly 5 days of worsening abdominal pain.  This was accompanied by nonbloody, nonbilious emesis.  She had not produced any urine in 4 days and had not been able to keep any food or water down for 5 days.  She experienced near syncope and presented to the emergency department for further evaluation.  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  According to the electronic medical record, she was being evaluated at [facility] by gastroenterology and surgery for possible inflammatory bowel disease and a sigmoid stricture.  She was actually due to undergo surgery for the stricture with [facility] surgery in the near future.  Emergency department evaluation was notable for a CT scan of the abdomen and pelvis that demonstrated large volume pneumoperitoneum with free fluid and mesenteric fat stranding throughout the abdomen and pelvis.  She was also found to have acute kidney injury with her creatinine being 4.81 (baseline around 1), as well as a mild elevation in her lactic acid and a leukocytosis of 19,000.  Based upon the CT findings and the patient's severe sepsis, she was taken emergently to the operating room for exploratory laparotomy.  Please see dictated operative note for full details.  She was subsequently transferred to the ICU for ongoing care.  Past Medical History:  Diagnosis Date   Diabetes mellitus without complication ([place])    Hypertension     Past Surgical History:   Procedure Laterality Date   BREAST BIOPSY Right    2019-benign   BREAST EXCISIONAL BIOPSY Right 2018   REDUCTION MAMMAPLASTY Bilateral    2010    Family History  Problem Relation Age of Onset   Breast cancer Mother     Social History:  reports that she has never smoked. She has never used smokeless tobacco. She reports that she does not drink alcohol and does not use drugs.  Allergies:  Allergies  Allergen Reactions   Amiodarone     Other reaction(s): Other (See Comments) Nausea, diarrhea, weight loss, anxiety, jitteriness   Levofloxacin Rash    Red palms, lips, forearms    Meloxicam Shortness Of Breath    Other reaction(s): Respiratory Distress   Amlodipine     Other reaction(s): Other (See Comments) Caused severe gingival hyperplasia   Amlodipine Besy-Benazepril Hcl     Severe gingival hyperplasia  Other reaction(s): Other (See Comments) Other reaction(s): Other   Beta Adrenergic Blockers     States severe fatigue  Other reaction(s): Other (See Comments) fatigue    Medications: I have reviewed the patient's current medications.  Results for orders placed or performed during the hospital encounter of 04/28/2021 (from the past 48 hour(s))  Lipase, blood     Status: None   Collection Time: 04/05/2021  3:48 AM  Result Value Ref Range   Lipase 41 11 - 51 U/L    Comment: Performed at [facility], [address], [place], [place] [address]  Comprehensive metabolic panel     Status: Abnormal   Collection Time: 04/18/2021  3:48 AM  Result Value Ref Range   Sodium 128 (L) 135 - 145 mmol/L   Potassium 4.1 3.5 - 5.1 mmol/L   Chloride 96 (L) 98 - 111 mmol/L   CO2 19 (L) 22 - 32 mmol/L   Glucose, Bld 146 (H) 70 - 99 mg/dL    Comment: Glucose reference range applies only to samples taken after fasting for at least 8 hours.   BUN 81 (H) 8 - 23 mg/dL   Creatinine, Ser 4.81 (H) 0.44 - 1.00 mg/dL   Calcium 8.7 (L) 8.9 - 10.3 mg/dL   Total Protein 6.6 6.5 - 8.1 g/dL    Albumin 2.6 (L) 3.5 - 5.0 g/dL   AST 56 (H) 15 - 41 U/L   ALT 41 0 - 44 U/L   Alkaline Phosphatase 70 38 - 126 U/L   Total Bilirubin 0.9 0.3 - 1.2 mg/dL   GFR, Estimated 9 (L) >60 mL/min    Comment: (NOTE) Calculated using the CKD-EPI Creatinine Equation (2021)    Anion gap 13 5 - 15    Comment: Performed at [facility], [address] [place], [place] [address]  CBC     Status: Abnormal   Collection Time: 04/18/2021  3:48 AM  Result Value Ref Range   WBC 19.0 (H) 4.0 - 10.5 K/uL   RBC 4.81 3.87 - 5.11 MIL/uL   Hemoglobin 12.0 12.0 - 15.0 g/dL   HCT 38.3 36.0 - 46.0 %   MCV 79.6 (L) 80.0 - 100.0 fL   MCH 24.9 (L) 26.0 - 34.0 pg   MCHC 31.3 30.0 - 36.0 g/dL   RDW 17.5 (H) 11.5 - 15.5 %   Platelets 461 (H) 150 - 400 K/uL   nRBC 0.0 0.0 - 0.2 %    Comment: Performed at [facility], [address] [place], [place] [address]  Type and screen [place]     Status: None (Preliminary result)   Collection Time: 04/24/2021  3:48 AM  Result Value Ref Range   ABO/RH(D) A POS    Antibody Screen NEG    Sample Expiration 04/10/2021,2359    Unit Number [id number]    Blood Component Type RBC LR PHER1    Unit division 00    Status of Unit ISSUED    Transfusion Status OK TO TRANSFUSE    Crossmatch Result Compatible    Unit Number [id number]    Blood Component Type RED CELLS,LR    Unit division 00    Status of Unit ISSUED    Transfusion Status OK TO TRANSFUSE    Crossmatch Result      Compatible Performed at [facility], [address] [place], [place] [address]   Resp Panel by RT-PCR (Flu A&B, Covid) Nasopharyngeal Swab     Status: None   Collection Time: 04/06/2021  4:40 AM   Specimen: Nasopharyngeal Swab; Nasopharyngeal(NP) swabs in vial transport medium  Result Value Ref Range   SARS Coronavirus 2 by RT PCR NEGATIVE NEGATIVE    Comment: (NOTE) SARS-CoV-2 target nucleic acids are NOT DETECTED.  The SARS-CoV-2 RNA is  generally detectable in upper respiratory specimens during the acute phase of infection. The lowest concentration of SARS-CoV-2 viral copies this assay can detect is 138 copies/mL. A negative result does not preclude SARS-Cov-2 infection and should not be used as the sole basis for treatment or other patient management decisions. A negative result may occur with  improper specimen collection/handling, submission of specimen other than nasopharyngeal swab, presence  of viral mutation(s) within the areas targeted by this assay, and inadequate number of viral copies(<138 copies/mL). A negative result must be combined with clinical observations, patient history, and epidemiological information. The expected result is Negative.  Fact Sheet for Patients:  EntrepreneurPulse.com.au  Fact Sheet for Healthcare Providers:  IncredibleEmployment.be  This test is no t yet approved or cleared by the [place] FDA and  has been authorized for detection and/or diagnosis of SARS-CoV-2 by FDA under an Emergency Use Authorization (EUA). This EUA will remain  in effect (meaning this test can be used) for the duration of the COVID-19 declaration under Section 564(b)(1) of the Act, 21 U.S.C.section 360bbb-3(b)(1), unless the authorization is terminated  or revoked sooner.       Influenza A by PCR NEGATIVE NEGATIVE   Influenza B by PCR NEGATIVE NEGATIVE    Comment: (NOTE) The Xpert Xpress SARS-CoV-2/FLU/RSV plus assay is intended as an aid in the diagnosis of influenza from Nasopharyngeal swab specimens and should not be used as a sole basis for treatment. Nasal washings and aspirates are unacceptable for Xpert Xpress SARS-CoV-2/FLU/RSV testing.  Fact Sheet for Patients: EntrepreneurPulse.com.au  Fact Sheet for Healthcare Providers: IncredibleEmployment.be  This test is not yet approved or cleared by the [place] FDA  and has been authorized for detection and/or diagnosis of SARS-CoV-2 by FDA under an Emergency Use Authorization (EUA). This EUA will remain in effect (meaning this test can be used) for the duration of the COVID-19 declaration under Section 564(b)(1) of the Act, 21 U.S.C. section 360bbb-3(b)(1), unless the authorization is terminated or revoked.  Performed at [facility], [address] [place], [place] [address]   Lactic acid, plasma     Status: Abnormal   Collection Time: 04/08/2021  5:27 AM  Result Value Ref Range   Lactic Acid, Venous 2.8 (HH) 0.5 - 1.9 mmol/L    Comment: CRITICAL RESULT CALLED TO, READ BACK BY AND VERIFIED WITH APRIL BRUMGARD AT 9735 04/11/2021.PMF Performed at [facility], [address] [place], [place] [address]   Urinalysis, Complete w Microscopic     Status: Abnormal   Collection Time: 04/10/2021  7:15 AM  Result Value Ref Range   Color, Urine AMBER (A) YELLOW    Comment: BIOCHEMICALS MAY BE AFFECTED BY COLOR   APPearance TURBID (A) CLEAR   Specific Gravity, Urine 1.020 1.005 - 1.030   pH 5.0 5.0 - 8.0   Glucose, UA 50 (A) NEGATIVE mg/dL   Hgb urine dipstick MODERATE (A) NEGATIVE   Bilirubin Urine NEGATIVE NEGATIVE   Ketones, ur NEGATIVE NEGATIVE mg/dL   Protein, ur 100 (A) NEGATIVE mg/dL   Nitrite NEGATIVE NEGATIVE   Leukocytes,Ua TRACE (A) NEGATIVE   RBC / HPF 11-20 0 - 5 RBC/hpf   WBC, UA 21-50 0 - 5 WBC/hpf   Bacteria, UA MANY (A) NONE SEEN   Squamous Epithelial / LPF 0-5 0 - 5   Mucus PRESENT    Non Squamous Epithelial PRESENT (A) NONE SEEN    Comment: Performed at [facility], [address] [place], [place] [address]  Lactic acid, plasma     Status: Abnormal   Collection Time: 04/08/2021  7:26 AM  Result Value Ref Range   Lactic Acid, Venous 2.9 (HH) 0.5 - 1.9 mmol/L    Comment: CRITICAL VALUE NOTED. VALUE IS CONSISTENT WITH PREVIOUSLY REPORTED/CALLED VALUE.PMF Performed at [facility], [address] [place], [place] [address]   Blood gas, arterial  Status: Abnormal   Collection Time: 04/14/2021 10:05 AM  Result Value Ref Range   Delivery systems VENTILATOR     Comment:  Pt in OR   pH, Arterial 7.26 (L) 7.350 - 7.450   pCO2 arterial 37 32.0 - 48.0 mmHg   pO2, Arterial 399 (H) 83.0 - 108.0 mmHg   Bicarbonate 16.6 (L) 20.0 - 28.0 mmol/L   Acid-base deficit 9.7 (H) 0.0 - 2.0 mmol/L   O2 Saturation 100.0 %   Patient temperature 37.0    Collection site LEFT BRACHIAL    Sample type ARTERIAL DRAW    Allens test (pass/fail) PASS PASS    Comment: Performed at [facility], [address], [place], [place] [address]  MRSA Next Gen by PCR, Nasal     Status: None   Collection Time: 04/13/2021 12:50 PM   Specimen: Nasal Mucosa; Nasal Swab  Result Value Ref Range   MRSA by PCR Next Gen NOT DETECTED NOT DETECTED    Comment: (NOTE) The GeneXpert MRSA Assay (FDA approved for NASAL specimens only), is one component of a comprehensive MRSA colonization surveillance program. It is not intended to diagnose MRSA infection nor to guide or monitor treatment for MRSA infections. Test performance is not FDA approved in patients less than 71 years old. Performed at [facility], [address] [place], [place] [address]   Glucose, capillary     Status: Abnormal   Collection Time: 04/30/2021 12:53 PM  Result Value Ref Range   Glucose-Capillary 133 (H) 70 - 99 mg/dL    Comment: Glucose reference range applies only to samples taken after fasting for at least 8 hours.  CBC with Differential/Platelet     Status: Abnormal   Collection Time: 04/27/2021 12:56 PM  Result Value Ref Range   WBC 14.9 (H) 4.0 - 10.5 K/uL   RBC 2.67 (L) 3.87 - 5.11 MIL/uL   Hemoglobin 6.7 (LL) 12.0 - 15.0 g/dL    Comment: REPEATED TO VERIFY RESULT CALLED TO, READ BACK BY AND VERIFIED WITH: KATHERINE CLAYTON,RN 1337 04/05/2021 GM    HCT 21.3 (L) 36.0 - 46.0 %   MCV 79.8 (L) 80.0 - 100.0 fL   MCH 25.1 (L)  26.0 - 34.0 pg   MCHC 31.5 30.0 - 36.0 g/dL   RDW 17.5 (H) 11.5 - 15.5 %   Platelets 266 150 - 400 K/uL   nRBC 0.0 0.0 - 0.2 %   Neutrophils Relative % 98 %   Neutro Abs 14.5 (H) 1.7 - 7.7 K/uL   Lymphocytes Relative 1 %   Lymphs Abs 0.1 (L) 0.7 - 4.0 K/uL   Monocytes Relative 1 %   Monocytes Absolute 0.2 0.1 - 1.0 K/uL   Eosinophils Relative 0 %   Eosinophils Absolute 0.0 0.0 - 0.5 K/uL   Basophils Relative 0 %   Basophils Absolute 0.0 0.0 - 0.1 K/uL   WBC Morphology MILD LEFT SHIFT (1-5% METAS, OCC MYELO, OCC BANDS)    RBC Morphology MIXED RBC MORPHOLOGY PRESENT    Smear Review Normal platelet morphology    Immature Granulocytes 0 %   Abs Immature Granulocytes 0.05 0.00 - 0.07 K/uL    Comment: Performed at [facility], [address] [place], [place] [address]  Comprehensive metabolic panel     Status: Abnormal   Collection Time: 05/03/2021 12:56 PM  Result Value Ref Range   Sodium 132 (L) 135 - 145 mmol/L   Potassium 4.3 3.5 - 5.1 mmol/L   Chloride 102  98 - 111 mmol/L   CO2 19 (L) 22 - 32 mmol/L   Glucose, Bld 152 (H) 70 - 99 mg/dL    Comment: Glucose reference range applies only to samples taken after fasting for at least 8 hours.   BUN 70 (H) 8 - 23 mg/dL   Creatinine, Ser 3.95 (H) 0.44 - 1.00 mg/dL   Calcium 7.5 (L) 8.9 - 10.3 mg/dL   Total Protein 3.8 (L) 6.5 - 8.1 g/dL   Albumin 2.3 (L) 3.5 - 5.0 g/dL   AST 42 (H) 15 - 41 U/L   ALT 21 0 - 44 U/L   Alkaline Phosphatase 28 (L) 38 - 126 U/L   Total Bilirubin 1.4 (H) 0.3 - 1.2 mg/dL   GFR, Estimated 11 (L) >60 mL/min    Comment: (NOTE) Calculated using the CKD-EPI Creatinine Equation (2021)    Anion gap 11 5 - 15    Comment: Performed at [facility], [address] [place], [place] [address]  Lactic acid, plasma     Status: Abnormal   Collection Time: 04/27/2021 12:56 PM  Result Value Ref Range   Lactic Acid, Venous 2.9 (HH) 0.5 - 1.9 mmol/L    Comment: CRITICAL VALUE NOTED. VALUE IS CONSISTENT  WITH PREVIOUSLY REPORTED/CALLED VALUE.PMF Performed at [facility], [address] [place], [place] [address]   Procalcitonin - Baseline     Status: None   Collection Time: 04/05/2021 12:56 PM  Result Value Ref Range   Procalcitonin 15.49 ng/mL    Comment:        Interpretation: PCT >= 10 ng/mL: Important systemic inflammatory response, almost exclusively due to severe bacterial sepsis or septic shock. (NOTE)       Sepsis PCT Algorithm           Lower Respiratory Tract                                      Infection PCT Algorithm    ----------------------------     ----------------------------         PCT < 0.25 ng/mL                PCT < 0.10 ng/mL          Strongly encourage             Strongly discourage   discontinuation of antibiotics    initiation of antibiotics    ----------------------------     -----------------------------       PCT 0.25 - 0.50 ng/mL            PCT 0.10 - 0.25 ng/mL               OR       >80% decrease in PCT            Discourage initiation of                                            antibiotics      Encourage discontinuation           of antibiotics    ----------------------------     -----------------------------         PCT >= 0.50 ng/mL  PCT 0.26 - 0.50 ng/mL                AND       <80% decrease in PCT             Encourage initiation of                                             antibiotics       Encourage continuation           of antibiotics    ----------------------------     -----------------------------        PCT >= 0.50 ng/mL                  PCT > 0.50 ng/mL               AND         increase in PCT                  Strongly encourage                                      initiation of antibiotics    Strongly encourage escalation           of antibiotics                                     -----------------------------                                           PCT <= 0.25 ng/mL                                                  OR                                        > 80% decrease in PCT                                      Discontinue / Do not initiate                                             antibiotics  Performed at [facility], [address] [place], [place] [address]   Protime-INR     Status: Abnormal   Collection Time: 04/13/2021 12:56 PM  Result Value Ref Range   Prothrombin Time 33.4 (H) 11.4 - 15.2 seconds   INR 3.3 (H) 0.8 - 1.2    Comment: (NOTE) INR goal varies based on device and disease states. Performed at [facility], [address], [place], [place] [address]   APTT  Status: None   Collection Time: 04/25/2021 12:56 PM  Result Value Ref Range   aPTT 34 24 - 36 seconds    Comment: Performed at [facility], [address] [place], [place] [address]  Magnesium     Status: None   Collection Time: 04/09/2021 12:56 PM  Result Value Ref Range   Magnesium 2.0 1.7 - 2.4 mg/dL    Comment: Performed at [facility], [address] [place], [place] [address]  ABO/Rh     Status: None   Collection Time: 04/21/2021 12:56 PM  Result Value Ref Range   ABO/RH(D)      A POS Performed at [facility], [address] [place], [place] [address]   Blood gas, arterial     Status: Abnormal   Collection Time: 04/17/2021  1:30 PM  Result Value Ref Range   FIO2 0.50    Delivery systems VENTILATOR    Mode PRESSURE REGULATED VOLUME CONTROL    VT 450 mL   Peep/cpap 5.0 cm H20   pH, Arterial 7.28 (L) 7.350 - 7.450   pCO2 arterial 38 32.0 - 48.0 mmHg   pO2, Arterial 216 (H) 83.0 - 108.0 mmHg   Bicarbonate 17.9 (L) 20.0 - 28.0 mmol/L   Acid-base deficit 8.2 (H) 0.0 - 2.0 mmol/L   O2 Saturation 99.7 %   Patient temperature 37.0    Collection site A-LINE    Sample type ARTERIAL DRAW    Mechanical Rate 16     Comment: Performed at [facility], [address] [place], [place] [address]  Prepare RBC (crossmatch)      Status: None   Collection Time: 04/19/2021  1:40 PM  Result Value Ref Range   Order Confirmation      ORDER PROCESSED BY BLOOD BANK Performed at [facility], [address] [place], [place] [address]   Glucose, capillary     Status: Abnormal   Collection Time: 04/21/2021  3:49 PM  Result Value Ref Range   Glucose-Capillary 145 (H) 70 - 99 mg/dL    Comment: Glucose reference range applies only to samples taken after fasting for at least 8 hours.  Hemoglobin and hematocrit, blood     Status: Abnormal   Collection Time: 04/28/2021  4:51 PM  Result Value Ref Range   Hemoglobin 10.5 (L) 12.0 - 15.0 g/dL    Comment: REPEATED TO VERIFY   HCT 31.3 (L) 36.0 - 46.0 %    Comment: Performed at [facility], [address] [place], [place] [address]    CT ABDOMEN PELVIS WO CONTRAST  Result Date: 04/05/2021 CLINICAL DATA:  Nonlocalized acute abdominal pain. Nausea and weakness. Decreased urination over the past 4 days EXAM: CT ABDOMEN AND PELVIS WITHOUT CONTRAST TECHNIQUE: Multidetector CT imaging of the abdomen and pelvis was performed following the standard protocol without IV contrast. COMPARISON:  None. FINDINGS: Lower chest: No acute abnormality. Hepatobiliary: Several similar-appearing fluid density lesions are noted within the liver with the largest measuring up to 2.3 cm. Findings likely represent hepatic cysts. Subcentimeter hypodensities are too small to characterize. No new focal lesion. No gallstones, gallbladder wall thickening, or pericholecystic fluid. No biliary dilatation. Pancreas: No focal lesion. Normal pancreatic contour. No surrounding inflammatory changes. No main pancreatic ductal dilatation. Spleen: Normal in size without focal abnormality. Adrenals/Urinary Tract: No adrenal nodule bilaterally. No nephrolithiasis, no hydronephrosis, and no contour-deforming renal mass. No ureterolithiasis or hydroureter. The urinary bladder is decompressed with a urinary Foley  catheter  terminating within the lumen. Several foci of gas are noted within the urinary bladder lumen as well as within the Foley catheter. Stomach/Bowel: Stomach is within normal limits. Irregular bowel wall and haziness throughout the abdomen pelvis. Question scattered colonic diverticula of the skin colon. No definite bowel dilatation. The appendix not definitely identified. Vascular/Lymphatic: No abdominal aorta or iliac aneurysm. Severe atherosclerotic plaque of the aorta and its branches. No abdominal, pelvic, or inguinal lymphadenopathy. Reproductive: Uterus and bilateral adnexa are unremarkable. Other: Large volume pneumoperitoneum with at least small volume free fluid mesenteric fat stranding throughout the abdomen and pelvis. Musculoskeletal: No abdominal wall hernia or abnormality. No suspicious lytic or blastic osseous lesions. No acute displaced fracture. Multilevel degenerative changes of the spine. IMPRESSION: 1. Bowel perforation with marked inflammatory changes and pneumoperitoneum throughout the abdomen and pelvis. Bowel ischemia not excluded. Markedly limited evaluation on this noncontrast study. Recommend emergent surgical consultation. 2. Multiple foci of gas noted within the urinary bladder lumen and Foley catheter tubing. Correlate with urinalysis to exclude infection. These results were called by telephone at the time of interpretation on 04/14/2021 at 5:19 am to provider [facility] , who verbally acknowledged these results. Electronically Signed   By: Iven Finn M.D.   On: 04/16/2021 05:28   DG Chest Port 1 View  Result Date: 05/03/2021 CLINICAL DATA:  Respiratory failure. EXAM: PORTABLE CHEST 1 VIEW COMPARISON:  May 30, 2020. FINDINGS: The heart size and mediastinal contours are within normal limits. Both lungs are clear. Endotracheal and nasogastric tubes are in good position. Left-sided pacemaker is unchanged in position. Right internal jugular catheter is in good  position. The visualized skeletal structures are unremarkable. IMPRESSION: No active disease. Aortic Atherosclerosis (ICD10-I70.0). Electronically Signed   By: Marijo Conception M.D.   On: 04/25/2021 13:48    Review of Systems  Unable to perform ROS: Acuity of condition  Blood pressure 131/88, pulse (!) 41, temperature (!) 96.5 F (35.8 C), temperature source Axillary, resp. rate 20, height 5\' 5"  (1.651 m), weight 63.5 kg, SpO2 98 %. Physical Exam Constitutional:      Appearance: She is ill-appearing and toxic-appearing.  HENT:     Head: Normocephalic and atraumatic.     Mouth/Throat:     Mouth: Mucous membranes are dry.  Eyes:     General: No scleral icterus.       Right eye: No discharge.        Left eye: No discharge.  Pulmonary:     Effort: No respiratory distress.  Abdominal:     General: There is distension.     Tenderness: There is abdominal tenderness. There is guarding and rebound.  Genitourinary:    Comments: Deferred Skin:    Comments: Skin on bilateral lower extremities is mottled, consistent with sepsis.  Neurological:     Mental Status: She is alert.  Psychiatric:     Comments: Unable to assess, secondary to severity of patient illness.   This physical exam reflects my findings in the preoperative area, prior to proceeding to the OR.  Exam was limited secondary to the need to expeditiously pursue surgical intervention.  Assessment/Plan: The patient was taken emergently to the operating room where she underwent exploratory laparotomy, low anterior resection, and colostomy placement.  Please see operative note for full details.  Due to her severe sepsis, she was admitted from the operating room to the intensive care unit.  Dr. Patsey Berthold has graciously offered to assume the role of primary provider, general surgery will  continue to follow.  Fredirick Maudlin 05/05/2021, 5:32 PM

## 2021-04-07 NOTE — ED Notes (Signed)
Only able to obtain one set of blood cultures, per dr. Alfred Levins do not delay antibiotic administration for blood cultures. Lab notified of need for venipuncture assist with second set.

## 2021-04-07 NOTE — ED Notes (Signed)
Additional warm blankets provided

## 2021-04-07 NOTE — Progress Notes (Signed)
Pharmacy Antibiotic Note  Lindsay [name] is a 75 y.o. female w/ PMH of HTN, DM, CHF, atrial fibrillation, diverticular disease, sigmoid stricture admitted on 04/19/2021 post emergent ex lap for perforated viscus, peritonitis, severe sepsis. Pharmacy has been consulted for Zosyn and Eraxis dosing. Her SCr is elevated far above her baseline level on admission.  Plan:  1) start Zosyn 2.25 grams IV every 8 hours Monitor renal function for needed dose adjustments  2) start anidulafungin 200 mg IVon day 1, then 100 mg once daily  Height: 5\' 5"  (165.1 cm) Weight: 63.5 kg (140 lb) IBW/kg (Calculated) : 57  Temp (24hrs), Avg:98 F (36.7 C), Min:98 F (36.7 C), Max:98 F (36.7 C)  Recent Labs  Lab 04/19/2021 0348 04/06/2021 0527 04/15/2021 0726  WBC 19.0*  --   --   CREATININE 4.81*  --   --   LATICACIDVEN  --  2.8* 2.9*    Estimated Creatinine Clearance: 9.1 mL/min (A) (by C-G formula based on SCr of 4.81 mg/dL (H)).    Allergies  Allergen Reactions   Amiodarone     Other reaction(s): Other (See Comments) Nausea, diarrhea, weight loss, anxiety, jitteriness   Levofloxacin Rash    Red palms, lips, forearms    Meloxicam Shortness Of Breath    Other reaction(s): Respiratory Distress   Amlodipine     Other reaction(s): Other (See Comments) Caused severe gingival hyperplasia   Amlodipine Besy-Benazepril Hcl     Severe gingival hyperplasia  Other reaction(s): Other (See Comments) Other reaction(s): Other   Beta Adrenergic Blockers     States severe fatigue  Other reaction(s): Other (See Comments) fatigue    Antimicrobials this admission: anidulafungin 07/03 >>  Zosyn 07/03 >>   Microbiology results: 07/03 BCx: pending 07/03 MRSA PCR: pending 07/03 SARS CoV-2: negative 07/03 influenza A/B: negative  Thank you for allowing pharmacy to be a part of this patient's care.  Dallie Piles 05/01/2021 1:10 PM

## 2021-04-07 NOTE — ED Notes (Signed)
Report given to [place], Therapist, sports in [place]

## 2021-04-07 NOTE — ED Notes (Signed)
No urine output noted in foley bag.

## 2021-04-07 NOTE — ED Notes (Signed)
Attempted iv insertion x1 without success, justin, rn in to ultrasound guide iv insertion. Pt declines warm blankets.

## 2021-04-07 NOTE — ED Notes (Signed)
Images PowerShared to [facility]

## 2021-04-07 NOTE — Anesthesia Procedure Notes (Signed)
Arterial Line Insertion Performed by: Harrie Foreman, MD, anesthesiologist  Preanesthetic checklist: patient identified, IV checked, site marked, risks and benefits discussed, surgical consent, monitors and equipment checked, pre-op evaluation, timeout performed and anesthesia consent Left, radial was placed Catheter size: 20 G Hand hygiene performed  and maximum sterile barriers used   Attempts: 1 Procedure performed using ultrasound guided technique. Ultrasound Notes:anatomy identified and no ultrasound evidence of intravascular and/or intraneural injection Following insertion, dressing applied and Biopatch. Post procedure assessment: normal  Patient tolerated the procedure well with no immediate complications.

## 2021-04-08 ENCOUNTER — Encounter: Payer: Self-pay | Admitting: Pulmonary Disease

## 2021-04-08 ENCOUNTER — Inpatient Hospital Stay: Payer: Medicare Other

## 2021-04-08 ENCOUNTER — Inpatient Hospital Stay
Admit: 2021-04-08 | Discharge: 2021-04-08 | Disposition: A | Discharge status: Home / Self Care | Payer: Medicare Other | Attending: Pulmonary Disease | Admitting: Pulmonary Disease

## 2021-04-08 DIAGNOSIS — D689 Coagulation defect, unspecified: Secondary | ICD-10-CM

## 2021-04-08 DIAGNOSIS — K631 Perforation of intestine (nontraumatic): Secondary | ICD-10-CM | POA: Diagnosis not present

## 2021-04-08 DIAGNOSIS — N179 Acute kidney failure, unspecified: Secondary | ICD-10-CM

## 2021-04-08 DIAGNOSIS — D62 Acute posthemorrhagic anemia: Secondary | ICD-10-CM

## 2021-04-08 DIAGNOSIS — J9601 Acute respiratory failure with hypoxia: Secondary | ICD-10-CM

## 2021-04-08 DIAGNOSIS — Z9911 Dependence on respirator [ventilator] status: Secondary | ICD-10-CM

## 2021-04-08 DIAGNOSIS — J9600 Acute respiratory failure, unspecified whether with hypoxia or hypercapnia: Secondary | ICD-10-CM

## 2021-04-08 LAB — TYPE AND SCREEN
ABO/RH(D): A POS
Antibody Screen: NEGATIVE
Unit division: 0
Unit division: 0

## 2021-04-08 LAB — CBC
HCT: 26.5 % — ABNORMAL LOW (ref 36.0–46.0)
Hemoglobin: 9 g/dL — ABNORMAL LOW (ref 12.0–15.0)
MCH: 27.8 pg (ref 26.0–34.0)
MCHC: 34 g/dL (ref 30.0–36.0)
MCV: 81.8 fL (ref 80.0–100.0)
Platelets: 167 10*3/uL (ref 150–400)
RBC: 3.24 MIL/uL — ABNORMAL LOW (ref 3.87–5.11)
RDW: 17 % — ABNORMAL HIGH (ref 11.5–15.5)
WBC: 21.3 10*3/uL — ABNORMAL HIGH (ref 4.0–10.5)
nRBC: 0 % (ref 0.0–0.2)

## 2021-04-08 LAB — RENAL FUNCTION PANEL
Albumin: 2.4 g/dL — ABNORMAL LOW (ref 3.5–5.0)
Albumin: 2.2 g/dL — ABNORMAL LOW (ref 3.5–5.0)
Anion gap: 12 (ref 5–15)
Anion gap: 17 — ABNORMAL HIGH (ref 5–15)
BUN: 71 mg/dL — ABNORMAL HIGH (ref 8–23)
BUN: 79 mg/dL — ABNORMAL HIGH (ref 8–23)
CO2: 19 mmol/L — ABNORMAL LOW (ref 22–32)
CO2: 16 mmol/L — ABNORMAL LOW (ref 22–32)
Calcium: 7.2 mg/dL — ABNORMAL LOW (ref 8.9–10.3)
Calcium: 7.1 mg/dL — ABNORMAL LOW (ref 8.9–10.3)
Chloride: 101 mmol/L (ref 98–111)
Chloride: 102 mmol/L (ref 98–111)
Creatinine, Ser: 4.24 mg/dL — ABNORMAL HIGH (ref 0.44–1.00)
Creatinine, Ser: 4.9 mg/dL — ABNORMAL HIGH (ref 0.44–1.00)
GFR, Estimated: 10 mL/min — ABNORMAL LOW (ref >60)
GFR, Estimated: 9 mL/min — ABNORMAL LOW (ref >60)
Glucose, Bld: 233 mg/dL — ABNORMAL HIGH (ref 70–99)
Glucose, Bld: 204 mg/dL — ABNORMAL HIGH (ref 70–99)
Phosphorus: 7.4 mg/dL — ABNORMAL HIGH (ref 2.5–4.6)
Phosphorus: 7.6 mg/dL — ABNORMAL HIGH (ref 2.5–4.6)
Potassium: 3.8 mmol/L (ref 3.5–5.1)
Potassium: 3.9 mmol/L (ref 3.5–5.1)
Sodium: 132 mmol/L — ABNORMAL LOW (ref 135–145)
Sodium: 135 mmol/L (ref 135–145)

## 2021-04-08 LAB — BPAM RBC
Blood Product Expiration Date: 202208022359
Blood Product Expiration Date: 202208022359
ISSUE DATE / TIME: 202207031415
ISSUE DATE / TIME: 202207031448
Unit Type and Rh: 6200
Unit Type and Rh: 6200

## 2021-04-08 LAB — ECHOCARDIOGRAM COMPLETE
AR max vel: 2.82 cm2
AV Area VTI: 2.5 cm2
AV Area mean vel: 2.67 cm2
AV Mean grad: 4 mmHg
AV Peak grad: 6.6 mmHg
Ao pk vel: 1.28 m/s
Area-P 1/2: 2.86 cm2
Height: 65 in
MV M vel: 4.16 m/s
MV Peak grad: 69.2 mmHg
MV VTI: 2.73 cm2
Radius: 0.7 cm
S' Lateral: 1.54 cm
Weight: 2240 oz

## 2021-04-08 LAB — GLUCOSE, CAPILLARY
Glucose-Capillary: 205 mg/dL — ABNORMAL HIGH (ref 70–99)
Glucose-Capillary: 205 mg/dL — ABNORMAL HIGH (ref 70–99)
Glucose-Capillary: 233 mg/dL — ABNORMAL HIGH (ref 70–99)
Glucose-Capillary: 192 mg/dL — ABNORMAL HIGH (ref 70–99)
Glucose-Capillary: 215 mg/dL — ABNORMAL HIGH (ref 70–99)

## 2021-04-08 LAB — BPAM FFP
Blood Product Expiration Date: 202207082359
Blood Product Expiration Date: 202207082359
ISSUE DATE / TIME: 202207032013
ISSUE DATE / TIME: 202207032236
Unit Type and Rh: 6200
Unit Type and Rh: 6200

## 2021-04-08 LAB — PREPARE FRESH FROZEN PLASMA: Unit division: 0

## 2021-04-08 LAB — TRIGLYCERIDES: Triglycerides: 126 mg/dL (ref <150)

## 2021-04-08 LAB — LACTIC ACID, PLASMA: Lactic Acid, Venous: 1.4 mmol/L (ref 0.5–1.9)

## 2021-04-08 LAB — MAGNESIUM: Magnesium: 1.9 mg/dL (ref 1.7–2.4)

## 2021-04-08 LAB — PROCALCITONIN: Procalcitonin: 22.12 ng/mL

## 2021-04-08 MED ORDER — CHLORHEXIDINE GLUCONATE CLOTH 2 % EX PADS: 6.0000 | MEDICATED_PAD | Freq: Every day | CUTANEOUS | Status: DC

## 2021-04-08 MED ORDER — PIPERACILLIN-TAZOBACTAM 3.375 G IVPB 30 MIN
3.3750 g | Freq: Four times a day (QID) | INTRAVENOUS | Status: DC
  Administered 2021-04-08 – 2021-04-12 (×16): 3.375 g via INTRAVENOUS
  Filled 2021-04-08 (×33): qty 50

## 2021-04-08 MED ORDER — HEPARIN SODIUM (PORCINE) 1000 UNIT/ML IJ SOLN
1000.0000 [IU] | INTRAMUSCULAR | Status: DC | PRN
  Administered 2021-04-10 – 2021-04-12 (×2): 3600 [IU]
  Filled 2021-04-08 (×3): qty 4

## 2021-04-08 MED ORDER — AMIODARONE HCL IN DEXTROSE 360-4.14 MG/200ML-% IV SOLN
60.0000 mg/h | INTRAVENOUS | Status: AC
  Administered 2021-04-08: 60 mg/h via INTRAVENOUS
  Filled 2021-04-08: qty 200

## 2021-04-08 MED ORDER — NOREPINEPHRINE 16 MG/250ML-% IV SOLN
0.0000 ug/min | INTRAVENOUS | Status: DC
  Administered 2021-04-09: 1 ug/min via INTRAVENOUS
  Filled 2021-04-08: qty 250

## 2021-04-08 MED ORDER — VASOPRESSIN 20 UNITS/100 ML INFUSION FOR SHOCK
0.0000 [IU]/min | INTRAVENOUS | Status: DC
  Administered 2021-04-08: 0.04 [IU]/min via INTRAVENOUS
  Administered 2021-04-08: 0.03 [IU]/min via INTRAVENOUS
  Administered 2021-04-09 – 2021-04-11 (×4): 0.04 [IU]/min via INTRAVENOUS
  Administered 2021-04-11: 0.03 [IU]/min via INTRAVENOUS
  Administered 2021-04-12 (×3): 0.04 [IU]/min via INTRAVENOUS
  Administered 2021-04-13: 0.025 [IU]/min via INTRAVENOUS
  Administered 2021-04-13: 0.04 [IU]/min via INTRAVENOUS
  Filled 2021-04-08 (×11): qty 100

## 2021-04-08 MED ORDER — METOPROLOL TARTRATE 5 MG/5ML IV SOLN
5.0000 mg | Freq: Once | INTRAVENOUS | Status: AC
  Administered 2021-04-08: 5 mg via INTRAVENOUS

## 2021-04-08 MED ORDER — TRAVASOL 10 % IV SOLN
INTRAVENOUS | Status: AC
  Filled 2021-04-08: qty 528

## 2021-04-08 MED ORDER — SODIUM CHLORIDE 0.45 % IV SOLN
INTRAVENOUS | Status: DC
  Filled 2021-04-08 (×2): qty 75

## 2021-04-08 MED ORDER — AMIODARONE IV BOLUS ONLY 150 MG/100ML
INTRAVENOUS | Status: AC
  Administered 2021-04-08: 150 mg
  Filled 2021-04-08: qty 100

## 2021-04-08 MED ORDER — AMIODARONE IV BOLUS ONLY 150 MG/100ML
150.0000 mg | Freq: Once | INTRAVENOUS | Status: AC
  Administered 2021-04-08: 150 mg via INTRAVENOUS

## 2021-04-08 MED ORDER — INSULIN ASPART 100 UNIT/ML IJ SOLN
0.0000 [IU] | INTRAMUSCULAR | Status: DC
  Administered 2021-04-08 (×4): 3 [IU] via SUBCUTANEOUS
  Administered 2021-04-08: 2 [IU] via SUBCUTANEOUS
  Administered 2021-04-09: 3 [IU] via SUBCUTANEOUS
  Administered 2021-04-09: 5 [IU] via SUBCUTANEOUS
  Administered 2021-04-09: 3 [IU] via SUBCUTANEOUS
  Filled 2021-04-08 (×8): qty 1

## 2021-04-08 MED ORDER — METOPROLOL TARTRATE 5 MG/5ML IV SOLN
INTRAVENOUS | Status: AC
  Filled 2021-04-08: qty 5

## 2021-04-08 MED ORDER — MIDAZOLAM HCL 2 MG/2ML IJ SOLN: 2.0000 mg | INTRAMUSCULAR | Status: DC | PRN

## 2021-04-08 MED ORDER — PRISMASOL BGK 4/2.5 32-4-2.5 MEQ/L EC SOLN: Status: DC

## 2021-04-08 MED ORDER — AMIODARONE HCL IN DEXTROSE 360-4.14 MG/200ML-% IV SOLN
30.0000 mg/h | INTRAVENOUS | Status: DC
  Administered 2021-04-08 – 2021-04-09 (×3): 30 mg/h via INTRAVENOUS
  Filled 2021-04-08 (×4): qty 200

## 2021-04-08 MED ORDER — PRISMASOL BGK 4/2.5 32-4-2.5 MEQ/L REPLACEMENT SOLN
Status: DC
  Filled 2021-04-08: qty 5000

## 2021-04-08 NOTE — Consult Note (Signed)
Cardiology Consultation Note    Patient ID: Lindsay [name], MRN: [id number], DOB/AGE: 1946-08-07 75 y.o. Admit date: 04/08/2021   Date of Consult: 04/08/2021 Primary Physician: Rusty Aus, MD Primary Cardiologist: Dr. Derinda Sis  Chief Complaint:  Abdominal pain/bowel perforation Reason for Consultation: Complicated cardiac Requesting MD: Dr. Mortimer Fries  HPI: Lindsay [name] is a 75 y.o. female with history of 65 y.o. who has a history of hypertrophic nonobstructive nonfamilial cardiomyopathy, mild to moderate MR diagnosed in 2005.  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   Prior use of Sotalol and dofetilide for rhythm control. Underwent repeat ablation here in October 2021 and no AAD afterwards.  CHADSVASC score 4 and on Xarelto.  She is now admitted and in ICU intubated status post pneumoperitoneum and bowel perforation.  Her most recent echocardiogram was done and December 2021 showing ejection fraction of greater than 60% with asymmetric septal hypertrophy with maximal wall thickness of 18 mm.  There is no systolic anterior mitral valve motion.  She has moderate mitral valve regurgitation.  Her AICD is for primary prevention followed by [facility].  Her echo in December of last year also showed a very small pericardial effusion with no tamponade physiology.  She currently is being treated as an outpatient with Xarelto 20 mg daily, rosuvastatin 10 mg daily, metoprolol succinate 100 mg twice daily, losartan 50 mg twice daily.  He has a reported amiodarone allergy, review of her chart from [place] suggest that it was an intolerance due to nausea, diarrhea, weight loss and jitteriness.  Her most recent ICD interrogation showed adequate function with several episodes of ATR.  She was being evaluated for probable Crohn's disease and/or diverticulitis.  On this admission patient  presented to [facility] after a 5-day history of bilateral lower abdominal pain.  Again as mentioned above, she was being worked up for a partial colectomy for treatment of possible Crohn's/diverticulitis at [facility].  This was being scheduled.  On presentation to the emergency room she was noted on a CT scan to show bowel perforation with marked inflammatory changes and severe pneumoperitoneum throughout the abdomen pelvis with possible bowel ischemia.  Emergent consult to general surgery resulted in emergent surgery.  She underwent an exploratory laparotomy, mobilization of the splenic flexure, extensive adhesion lysis, low anterior resection, descending colostomy, left salpingo-oophorectomy and placement of a wound VAC.  Approximately 1.5 L of pus was released.  Noted to have a small splenic laceration.  She is currently intubated and on pressors.  She is in probable atrial tachycardia at a rate of 110-120.  She was placed on an amiodarone drip.  As mentioned above, amiodarone is listed on her allergies but it appears at least currently to be an intolerance.  She is on metoprolol succinate 100 mg twice daily as an outpatient.  Patient is currently intubated and sedated.  Past Medical History:  Diagnosis Date   Diabetes mellitus without complication ([place])    Hypertension       Surgical History:  Past Surgical History:  Procedure Laterality Date   BREAST BIOPSY Right    2019-benign   BREAST EXCISIONAL BIOPSY Right 2018   REDUCTION MAMMAPLASTY Bilateral    2010     Home Meds: Prior to Admission medications   Medication Sig Start Date End Date Taking? Authorizing Provider  acetaminophen (TYLENOL) 500 MG tablet Take 500 mg by  mouth every 6 (six) hours as needed.   Yes [provider]  Alum Hydroxide-Mag Carbonate (GAVISCON PO) Take 5-10 mLs by mouth 3 (three) times daily as needed. Plus or minus 3 hours from metoprolol succinate er   Yes [provider]  Cyanocobalamin (VITAMIN B12 PO)  Take 1,000 mcg by mouth daily.   Yes [provider]  esomeprazole (NEXIUM) 20 MG capsule Take 20 mg by mouth daily at 12 noon.   Yes [provider]  losartan (COZAAR) 50 MG tablet Take 50 mg by mouth 2 (two) times daily. 04/30/20  Yes [provider]  metoprolol succinate (TOPROL-XL) 100 MG 24 hr tablet Take 100 mg by mouth 2 (two) times daily. 05/27/20  Yes [provider]  Multiple Vitamins tablet Take 2 tablets by mouth daily. 01/26/20  Yes [provider]  rosuvastatin (CRESTOR) 10 MG tablet Take 10 mg by mouth at bedtime. 04/28/20  Yes [provider]  XARELTO 20 MG TABS tablet Take 20 mg by mouth daily. 04/28/20  Yes [provider]  amiodarone (PACERONE) 200 MG tablet Take by mouth. Patient not taking: No sig reported 05/27/20   [provider]  amoxicillin-clavulanate (AUGMENTIN) 875-125 MG tablet Take 1 tablet by mouth 2 (two) times daily. Patient not taking: No sig reported    [provider]  aspirin 81 MG EC tablet Aspir-81 mg tablet,delayed release  Take 1 tablet every day by oral route. Patient not taking: No sig reported    [provider]  beta carotene w/minerals (OCUVITE) tablet Take 1 tablet by mouth daily. Patient not taking: No sig reported 01/26/20   [provider]  calcium citrate-vitamin D (CITRACAL+D) 315-200 MG-UNIT tablet Take by mouth. Patient not taking: No sig reported    [provider]  ketoconazole (NIZORAL) 2 % shampoo Shampoo into the scalp let sit 5-10 minutes then wash out. Use 3d/wk. Patient not taking: Reported on 04/17/2021 02/25/21   Ralene Bathe, MD  MAGNESIUM OXIDE PO Take 165 mg by mouth daily. Patient not taking: Reported on 04/27/2021    [provider]  mupirocin ointment (BACTROBAN) 2 % Apply to ulceration QD until healed. Patient not taking: Reported on 04/05/2021 02/25/21   Ralene Bathe, MD    Inpatient Medications:   sodium  chloride   Intravenous Once   albuterol  2.5 mg Nebulization Q6H   chlorhexidine gluconate (MEDLINE KIT)  15 mL Mouth Rinse BID   Chlorhexidine Gluconate Cloth  6 each Topical Q0600   insulin aspart  0-9 Units Subcutaneous Q4H   mouth rinse  15 mL Mouth Rinse 10 times per day   metoprolol tartrate       pantoprazole (PROTONIX) IV  40 mg Intravenous Daily    sodium chloride 50 mL/hr at 04/08/21 0600   amiodarone 60 mg/hr (04/08/21 0600)   amiodarone     anidulafungin     fentaNYL infusion INTRAVENOUS 100 mcg/hr (04/08/21 0600)   norepinephrine (LEVOPHED) Adult infusion 5 mcg/min (04/08/21 0600)   piperacillin-tazobactam (ZOSYN)  IV 2.25 g (04/08/21 0542)   propofol (DIPRIVAN) infusion 30 mcg/kg/min (04/08/21 0600)   sodium bicarbonate in 0.45 NS mL infusion 75 mL/hr at 04/08/21 0600   vasopressin 0.03 Units/min (04/08/21 0631)    Allergies:  Allergies  Allergen Reactions   Amiodarone     Other reaction(s): Other (See Comments) Nausea, diarrhea, weight loss, anxiety, jitteriness   Levofloxacin Rash    Red palms, lips, forearms    Meloxicam Shortness  Of Breath    Other reaction(s): Respiratory Distress   Amlodipine     Other reaction(s): Other (See Comments) Caused severe gingival hyperplasia   Amlodipine Besy-Benazepril Hcl     Severe gingival hyperplasia  Other reaction(s): Other (See Comments) Other reaction(s): Other   Beta Adrenergic Blockers     States severe fatigue  Other reaction(s): Other (See Comments) fatigue    Social History   Socioeconomic History   Marital status: Married    Spouse name: Not on file   Number of children: Not on file   Years of education: Not on file   Highest education level: Not on file  Occupational History   Not on file  Tobacco Use   Smoking status: Never   Smokeless tobacco: Never  Substance and Sexual Activity   Alcohol use: Never   Drug use: Never   Sexual activity: Not Currently  Other Topics Concern   Not on file   Social History Narrative   Not on file   Social Determinants of Health   Financial Resource Strain: Not on file  Food Insecurity: Not on file  Transportation Needs: Not on file  Physical Activity: Not on file  Stress: Not on file  Social Connections: Not on file  Intimate Partner Violence: Not on file     Family History  Problem Relation Age of Onset   Breast cancer Mother      Review of Systems: A 12-system review of systems was performed and is negative except as noted in the HPI.  Labs: No results for input(s): CKTOTAL, CKMB, TROPONINI in the last 72 hours. Lab Results  Component Value Date   WBC 21.3 (H) 04/08/2021   HGB 9.0 (L) 04/08/2021   HCT 26.5 (L) 04/08/2021   MCV 81.8 04/08/2021   PLT 167 04/08/2021    Recent Labs  Lab 04/15/2021 1256 04/08/21 0307  NA 132* 132*  K 4.3 3.8  CL 102 101  CO2 19* 19*  BUN 70* 71*  CREATININE 3.95* 4.24*  CALCIUM 7.5* 7.2*  PROT 3.8*  --   BILITOT 1.4*  --   ALKPHOS 28*  --   ALT 21  --   AST 42*  --   GLUCOSE 152* 233*   Lab Results  Component Value Date   TRIG 126 04/08/2021   No results found for: DDIMER  Radiology/Studies:  CT ABDOMEN PELVIS WO CONTRAST  Result Date: 04/17/2021 CLINICAL DATA:  Nonlocalized acute abdominal pain. Nausea and weakness. Decreased urination over the past 4 days EXAM: CT ABDOMEN AND PELVIS WITHOUT CONTRAST TECHNIQUE: Multidetector CT imaging of the abdomen and pelvis was performed following the standard protocol without IV contrast. COMPARISON:  None. FINDINGS: Lower chest: No acute abnormality. Hepatobiliary: Several similar-appearing fluid density lesions are noted within the liver with the largest measuring up to 2.3 cm. Findings likely represent hepatic cysts. Subcentimeter hypodensities are too small to characterize. No new focal lesion. No gallstones, gallbladder wall thickening, or pericholecystic fluid. No biliary dilatation. Pancreas: No focal lesion. Normal pancreatic contour.  No surrounding inflammatory changes. No main pancreatic ductal dilatation. Spleen: Normal in size without focal abnormality. Adrenals/Urinary Tract: No adrenal nodule bilaterally. No nephrolithiasis, no hydronephrosis, and no contour-deforming renal mass. No ureterolithiasis or hydroureter. The urinary bladder is decompressed with a urinary Foley catheter terminating within the lumen. Several foci of gas are noted within the urinary bladder lumen as well as within the Foley catheter. Stomach/Bowel: Stomach is within normal limits. Irregular bowel wall and haziness throughout  the abdomen pelvis. Question scattered colonic diverticula of the skin colon. No definite bowel dilatation. The appendix not definitely identified. Vascular/Lymphatic: No abdominal aorta or iliac aneurysm. Severe atherosclerotic plaque of the aorta and its branches. No abdominal, pelvic, or inguinal lymphadenopathy. Reproductive: Uterus and bilateral adnexa are unremarkable. Other: Large volume pneumoperitoneum with at least small volume free fluid mesenteric fat stranding throughout the abdomen and pelvis. Musculoskeletal: No abdominal wall hernia or abnormality. No suspicious lytic or blastic osseous lesions. No acute displaced fracture. Multilevel degenerative changes of the spine. IMPRESSION: 1. Bowel perforation with marked inflammatory changes and pneumoperitoneum throughout the abdomen and pelvis. Bowel ischemia not excluded. Markedly limited evaluation on this noncontrast study. Recommend emergent surgical consultation. 2. Multiple foci of gas noted within the urinary bladder lumen and Foley catheter tubing. Correlate with urinalysis to exclude infection. These results were called by telephone at the time of interpretation on 04/30/2021 at 5:19 am to provider [facility] , who verbally acknowledged these results. Electronically Signed   By: Iven Finn M.D.   On: 04/06/2021 05:28   Portable Chest xray  Result Date:  04/08/2021 CLINICAL DATA:  Respiratory failure. History of diabetes and hypertension. Follow-up exam. EXAM: PORTABLE CHEST 1 VIEW COMPARISON:  05/01/2021. FINDINGS: Cardiac silhouette normal in size.  No mediastinal or hilar masses. Stable prominent bronchovascular markings bilaterally. No convincing pneumonia or pulmonary edema. Endotracheal tube, nasal/orogastric tube, right internal jugular central venous catheter and left anterior chest wall AICD are all stable. IMPRESSION: 1. No acute cardiopulmonary disease. 2. Support apparatus is stable and well positioned. Electronically Signed   By: Lajean Manes M.D.   On: 04/08/2021 07:46   DG Chest Port 1 View  Result Date: 04/22/2021 CLINICAL DATA:  Respiratory failure. EXAM: PORTABLE CHEST 1 VIEW COMPARISON:  May 30, 2020. FINDINGS: The heart size and mediastinal contours are within normal limits. Both lungs are clear. Endotracheal and nasogastric tubes are in good position. Left-sided pacemaker is unchanged in position. Right internal jugular catheter is in good position. The visualized skeletal structures are unremarkable. IMPRESSION: No active disease. Aortic Atherosclerosis (ICD10-I70.0). Electronically Signed   By: Marijo Conception M.D.   On: 04/06/2021 13:48    Wt Readings from Last 3 Encounters:  04/17/2021 63.5 kg  05/30/20 79.4 kg  05/09/20 84.3 kg    EKG: Atrial tachycardia versus atrial fibrillation  Physical Exam:  Blood pressure (!) 97/54, pulse (!) 105, temperature (!) 96.26 F (35.7 C), resp. rate 20, height '5\' 5"'  (1.651 m), weight 63.5 kg, SpO2 97 %. Body mass index is 23.3 kg/m. General: Acutely ill female on a ventilator intubated and sedated Head: Normocephalic, atraumatic, sclera non-icteric, no xanthomas, nares are without discharge.  Neck: Negative for carotid bruits. JVD not elevated. Lungs: Breathing with ventilator Heart: Tachycardia Abdomen: Absent bowel sounds. Msk:  Strength and tone appear normal for  age. Extremities: No clubbing or cyanosis. No edema.  Distal pedal pulses are 2+ and equal bilaterally. Neuro: Intubated and sedated     Assessment and Plan  75 year old female with history of hypertrophic obstructive nonsegmental cardiomyopathy was preserved LV function diagnosed in 2005, history of AICD in place for primary prevention status post A. fib ablation x2 admitted with an acute abdomen with perforation and free air.  Status post surgical intervention.  Currently with a narrow complex tachycardia consistent with atrial tachycardia versus atrial fib with RVR.  Rate between 101 120.  1.  Atrial fibrillation-appears to be in an atrial tachycardia.  Has had this  as an outpatient.  Has been on a variety of antiarrhythmic medications.  Is status post ablation x2.  Chart reports an intolerance to amiodarone but patient is currently on an amiodarone drip.  The intolerance apparently was agitation and gastric distress.  We will continue with this for now to attempt rate control.  Will discuss with her electrophysiologist in the morning regarding further therapy.  Has had a history of inappropriate treatments due to atrial tachycardia.  Currently will attempt to keep rate slowed to prevent this.  Was on Xarelto as an outpatient.  Will defer this for now status post acute abdomen.  2.  Hypertrophic obstructive cardiomyopathy-being [facility] at [facility].  Echo done today reveals similar findings to echo done 1 year ago.  EF 60 to 70% with asymmetric septal hypertrophy.  There is no evidence of Sam.  There is mild to moderate MR which has been present previously.  We will continue to follow.  We will again discuss with the patient's primary cardiology team at [facility] tomorrow.  3.  Acute renal insufficiency likely secondary to acute abdomen.  May need CRRT.  4.  Acute abdomen-status post bowel perforation status postsurgical resection with partial colectomy.  Has Jackson-Pratt drains  and wound VAC in place.  Continue tube feed followed by general surgery.    Signed, Teodoro Spray MD 04/08/2021, 8:27 AM Pager: [phone]

## 2021-04-08 NOTE — Consult Note (Signed)
Brief Pharmacy Note  Consult for medication review for CRRT. Medications reviewed. Discussed with RN, CRRT likely to be started later this evening. Upon initiation of CRRT plan is to change Zosyn dosing to 3.375 g IV q6h.   Benita Gutter  04/08/21

## 2021-04-08 NOTE — Consult Note (Signed)
NAME:  Lindsay [name], MRN:  [id number], DOB:  07-03-1946, LOS: 1 ADMISSION DATE:  05/04/2021, CONSULTATION DATE:  04/14/2021 REFERRING MD:  Fredirick Maudlin, MD CHIEF COMPLAINT:  Severe sepsis, intra abdominal catastrophe    History of Present Illness:  75 year old lifelong never smoker with a history as noted below, who presented to [facility] today after a 5-day history of constant sharp and severe bilateral lower abdominal pain associated with daily episodes of nonbloody and nonbilious emesis.  The patient had not been able to take significant p.o. intake for approximately 5 days.  She endorsed to the emergency room physician that she had not made urine in 4 days PTA.  She presented to [facility] in the early hours of the morning feeling that she was in a pass out and feeling extremely weak.  She did not endorse any shortness of breath fevers or chills.  She stated that she was having bowel movements.  No constipation or diarrhea.  On evaluation at the emergency room the patient was noted to be tachypneic and described as being pale and sick appearing.  She was noted to have diffusely tender abdomen particular in the lower quadrants but described not to be having any rebound.  A CT abdomen and pelvis was obtained and this showed bowel perforation with marked inflammatory changes and severe pneumoperitoneum throughout the abdomen and pelvis.  Bowel ischemia could not be excluded.  Emergent surgical consultation was recommended.  This point Dr. Fredirick Maudlin was consulted emergently and the patient went emergently to the operating room.  Dr. Celine Ahr performed an exploratory laparotomy, mobilization of the splenic flexure, extensive adhesion lysis, low anterior resection, descending colostomy, left salpingo-oophorectomy and placement of a wound VAC.  Dr. Celine Ahr described that all upon entering the abdomen roughly 1.5 L of pus were released.  There was no obvious feculent drainage.  During the procedure a small splenic  laceration occur during the exploration which was managed with topical hemostatic agents.  Patient also had placement of multiple drains in the abdomen.  The patient had central line arterial line placed prior to surgery.  Patient received appropriate volume resuscitation with crystalloid and colloid and was transferred to the intensive care unit after the procedure and mechanically ventilated status.  She was at the time requiring pressors in the form of norepinephrine.  At that point PCCM was consulted for management of the patient's critical illness.  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  She has a history of A. fib and is on amiodarone and anticoagulation with Xarelto.  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 Fib Diverticulitis  Significant Hospital Events: Including procedures, antibiotic start and stop dates in addition to other pertinent events   04/25/2021 admitted post emergent ex lap for perforated viscus, peritonitis, severe sepsis 04/08/2021 piperacillin tazobactam and Eraxis initiated 7/4 remains critically ill, severe multiorgan failure   Interim History / Subjective:  Remains critically ill Multiorgan failure Severe septic shock Central line and A-line present Wound VAC present  Objective   Blood pressure (!) 97/54, pulse 61, temperature (!) 96.26 F (35.7 C), resp. rate 20, height 5\' 5"  (1.651 m), weight 63.5 kg, SpO2 97 %.    Vent Mode: PRVC FiO2 (%):  [30 %-50 %] 30 % Set Rate:  [16 bmp-20 bmp] 20 bmp Vt Set:  [450 mL] 450 mL PEEP:  [5 cmH20] 5 cmH20 Plateau Pressure:  [  18 cmH20] 18 cmH20   Intake/Output Summary (Last 24 hours) at 04/08/2021 0733 Last data filed at 04/08/2021 0600 Gross per 24 hour  Intake 8202.84 ml  Output 1592 ml  Net 6610.84 ml    Filed Weights   04/08/2021 0310  Weight: 63.5 kg   REVIEW OF SYSTEMS  PATIENT IS  UNABLE TO PROVIDE COMPLETE REVIEW OF SYSTEMS DUE TO SEVERE CRITICAL ILLNESS AND TOXIC METABOLIC ENCEPHALOPATHY   PHYSICAL EXAMINATION:  GENERAL:critically ill appearing, +resp distress HEAD: Normocephalic, atraumatic.  EYES: Pupils equal, round, reactive to light.  No scleral icterus.  MOUTH: Moist mucosal membrane. NECK: Supple. No thyromegaly. No nodules. No JVD.  PULMONARY: +rhonchi, +wheezing CARDIOVASCULAR: S1 and S2. Regular rate and rhythm. No murmurs, rubs, or gallops.  GASTROINTESTINAL: ABDOMEN: Midline wound VAC in place, multiple drains in place, serosanguineous/sanguinous  MUSCULOSKELETAL: No swelling, clubbing, or edema.  NEUROLOGIC: obtunded SKIN:intact,warm,dry     Assessment & Plan:   75 yo female with severe bowel perforation SEPSIS PRESENT ON ADMISSION and ABD abscess with severe septic shock s/p ex lap with progressive multiorgan failure   Severe ACUTE Hypoxic and Hypercapnic Respiratory Failure -continue Mechanical Ventilator support -continue Bronchodilator Therapy -Wean Fio2 and PEEP as tolerated -VAP/VENT bundle implementation Unable to wean from vent today   INFECTIOUS DISEASE  Peritonitis with severe sepsis Bowel perforation Severe sepsis -continue antibiotics as prescribed -follow up cultures  Septic shock -use vasopressors to keep MAP>65 -follow ABG and LA -follow up cultures -emperic ABX -consider stress dose steroids -aggressive IV fluid Resuscitation Piperacillin tazobactam/Eraxis Follow cultures, trend procalcitonin Wound VAC management per general surgery   ACUTE KIDNEY INJURY/Renal Failure -continue Foley Catheter-assess need -Avoid nephrotoxic agents -Follow urine output, BMP -Ensure adequate renal perfusion, optimize oxygenation -Renal dose medications Consult nephrology if no improvement   SEVERE METABOLIC ACIDOSIS Peritonitis with severe sepsis Bowel perforation Severe sepsis Lactic acidosis due to severe  sepsis Sodium bicarb infusion   History of hypertrophic cardiomyopathy Pacer AICD in situ Chronic atrial fibrillation Obtain 2D echo Continuous cardiac monitoring Amiodarone infusion if develops RVR Cardiology consultation as needed  Best Practice (right click and "Reselect all SmartList Selections" daily)   Diet/type: NPO DVT prophylaxis: SCD GI prophylaxis: PPI Lines: Central line, Arterial Line, and yes and it is still needed Foley:  Yes, and it is still needed Code Status:  full code CCM prognosis: Exceedingly guarded Last date of multidisciplinary goals of care discussion [7/3]  Labs   CBC: Recent Labs  Lab 04/27/2021 0348 04/06/2021 1256 04/17/2021 1651 04/08/21 0307  WBC 19.0* 14.9*  --  21.3*  NEUTROABS  --  14.5*  --   --   HGB 12.0 6.7* 10.5* 9.0*  HCT 38.3 21.3* 31.3* 26.5*  MCV 79.6* 79.8*  --  81.8  PLT 461* 266  --  167     Basic Metabolic Panel: Recent Labs  Lab 04/23/2021 0348 04/15/2021 1256 04/08/21 0307  NA 128* 132* 132*  K 4.1 4.3 3.8  CL 96* 102 101  CO2 19* 19* 19*  GLUCOSE 146* 152* 233*  BUN 81* 70* 71*  CREATININE 4.81* 3.95* 4.24*  CALCIUM 8.7* 7.5* 7.2*  MG  --  2.0 1.9  PHOS  --   --  7.4*    GFR: Estimated Creatinine Clearance: 10.3 mL/min (A) (by C-G formula based on SCr of 4.24 mg/dL (H)). Recent Labs  Lab 05/02/2021 0348 04/30/2021 0527 04/10/2021 0726 04/05/2021 1256 04/08/21 0307  PROCALCITON  --   --   --  15.49  22.12  WBC 19.0*  --   --  14.9* 21.3*  LATICACIDVEN  --  2.8* 2.9* 2.9* 1.4     Liver Function Tests: Recent Labs  Lab 04/18/2021 0348 04/10/2021 1256 04/08/21 0307  AST 56* 42*  --   ALT 41 21  --   ALKPHOS 70 28*  --   BILITOT 0.9 1.4*  --   PROT 6.6 3.8*  --   ALBUMIN 2.6* 2.3* 2.4*    Recent Labs  Lab 04/13/2021 0348  LIPASE 41    No results for input(s): AMMONIA in the last 168 hours.  ABG    Component Value Date/Time   PHART 7.28 (L) 04/06/2021 1330   PCO2ART 38 04/26/2021 1330   PO2ART 216  (H) 04/05/2021 1330   HCO3 17.9 (L) 04/27/2021 1330   ACIDBASEDEF 8.2 (H) 04/19/2021 1330   O2SAT 99.7 04/19/2021 1330     Recent Labs  Lab 04/18/2021 1256  INR 3.3*   CBG: Recent Labs  Lab 04/27/2021 1549 04/17/2021 1938 04/28/2021 2340 04/08/21 0348 04/08/21 0717  GLUCAP 145* 133* 161* 205* 205*   Allergies Allergies  Allergen Reactions   Amiodarone     Other reaction(s): Other (See Comments) Nausea, diarrhea, weight loss, anxiety, jitteriness   Levofloxacin Rash    Red palms, lips, forearms    Meloxicam Shortness Of Breath    Other reaction(s): Respiratory Distress   Amlodipine     Other reaction(s): Other (See Comments) Caused severe gingival hyperplasia   Amlodipine Besy-Benazepril Hcl     Severe gingival hyperplasia  Other reaction(s): Other (See Comments) Other reaction(s): Other   Beta Adrenergic Blockers     States severe fatigue  Other reaction(s): Other (See Comments) fatigue      DVT/GI PRX  assessed I Assessed the need for Labs I Assessed the need for Foley I Assessed the need for Central Venous Line Family Discussion when available I Assessed the need for Mobilization I made an Assessment of medications to be adjusted accordingly Safety Risk assessment completed  CASE DISCUSSED IN MULTIDISCIPLINARY ROUNDS WITH ICU TEAM     Critical Care Time devoted to patient care services described in this note is 65 minutes.  Critical care was necessary to treat /prevent imminent and life-threatening deterioration. Overall, patient is critically ill, prognosis is guarded.  Patient with Multiorgan failure and at high risk for cardiac arrest and death.    Corrin Parker, M.D.  [facility]  Medical Director [place] Director [facility] Cardio-Pulmonary Department

## 2021-04-08 NOTE — Procedures (Signed)
Central Venous Dailysis Catheter Placement: TRIPLE LUMEN    Indication(s) Hemo Dialysis/CRRT  Consent VERBAL/WRITTEN Risks of the procedure as well as the alternatives and risks of each were explained to the patient and/or caregiver.  Consent for the procedure was obtained and is signed in the bedside chart   Anesthesia Topical only with 1% lidocaine    Timeout Verified patient identification, verified procedure, site/side was marked, verified correct patient position, special equipment/implants available, medications/allergies/relevant history reviewed, required imaging and test results available. Patient comfort was obtained.     Sterile Technique Maximal sterile technique including full sterile barrier drape, hand hygiene, sterile gown, sterile gloves, mask, hair covering, sterile ultrasound probe cover (if used).   Hand washing performed prior to starting the procedure.    Procedure Description Area of catheter insertion was cleaned with chlorhexidine and draped in sterile fashion.  With real-time ultrasound guidance a central venous catheter was placed into the vein. Nonpulsatile blood flow and easy flushing noted in all ports.  The catheter was sutured in place and sterile dressing applied.   A triple lumen catheter was placed in LEFT  Internal Jugular Vein There was good blood return, catheter caps were placed on lumens, catheter flushed easily, the line was secured and a sterile dressing and BIO-PATCH applied.   Ultrasound was used to visualize vasculature and guidance of needle.   Number of Attempts: 1 Complications:none  Estimated Blood Loss: none Operator: Yemaya Barnier.   Corrin Parker, M.D.  [facility]  Medical Director [place] Director [facility] Cardio-Pulmonary Department

## 2021-04-08 NOTE — Progress Notes (Signed)
Pharmacy Antibiotic Note  Lindsay [name] is a 75 y.o. female w/ PMH of HTN, DM, CHF, atrial fibrillation, diverticular disease, sigmoid stricture admitted on 04/29/2021 post emergent ex lap for perforated viscus, peritonitis, severe sepsis. Pharmacy has been consulted for Zosyn and Eraxis dosing. Her SCr is elevated far above her baseline level on admission and now being started on CRRT.  Plan: 1) Increase Zosyn to 3.375 grams IV every 6 hours Monitor renal function for needed dose adjustments  2) Continue anidulafungin 100 mg once daily, no dose adjustment necessary for CRRT   Height: 5\' 5"  (165.1 cm) Weight: 63.5 kg (140 lb) IBW/kg (Calculated) : 57  Temp (24hrs), Avg:96.7 F (35.9 C), Min:95 F (35 C), Max:99 F (37.2 C)  Recent Labs  Lab 04/29/2021 0348 04/08/2021 0527 04/26/2021 0726 04/15/2021 1256 04/08/21 0307 04/08/21 1650  WBC 19.0*  --   --  14.9* 21.3*  --   CREATININE 4.81*  --   --  3.95* 4.24* 4.90*  LATICACIDVEN  --  2.8* 2.9* 2.9* 1.4  --      Estimated Creatinine Clearance: 8.9 mL/min (A) (by C-G formula based on SCr of 4.9 mg/dL (H)).    Allergies  Allergen Reactions   Amiodarone     Other reaction(s): Other (See Comments) Nausea, diarrhea, weight loss, anxiety, jitteriness   Levofloxacin Rash    Red palms, lips, forearms    Meloxicam Shortness Of Breath    Other reaction(s): Respiratory Distress   Amlodipine     Other reaction(s): Other (See Comments) Caused severe gingival hyperplasia   Amlodipine Besy-Benazepril Hcl     Severe gingival hyperplasia  Other reaction(s): Other (See Comments) Other reaction(s): Other   Beta Adrenergic Blockers     States severe fatigue  Other reaction(s): Other (See Comments) fatigue    Antimicrobials this admission: anidulafungin 07/03 >>  Zosyn 07/03 >>   Microbiology results: 07/03 BCx: NG x1 day  07/03 MRSA PCR: negative 07/03 SARS CoV-2: negative 07/03 influenza A/B: negative  Thank you for allowing  pharmacy to be a part of this patient's care.  Darnelle Bos, PharmD 04/08/2021 6:01 PM

## 2021-04-08 NOTE — Progress Notes (Signed)
GOALS OF CARE DISCUSSION  The Clinical status was relayed to family in detail. Husband and Niece at Bedside  Updated and notified of patients medical condition.    Patient remains unresponsive and will not open eyes to command.   Patient is having a weak cough and struggling to remove secretions.   Patient with increased WOB and using accessory muscles to breathe Explained to family course of therapy and the modalities  Severe multiorgan failure s/p Hartmanns for perforated viscus septic shock Multiple vasopressors   Patient with Progressive multiorgan failure with a very high probablity of a very minimal chance of meaningful recovery despite all aggressive and optimal medical therapy.    Family understands the situation.  They have consented and agreed to DNR status.  Plan for Vasc placement and CRRT Consent obtained  Family are satisfied with Plan of action and management. All questions answered  Additional CC time 35 mins   Vaishali Baise Patricia Pesa, M.D.  [facility]  Medical Director [place] Director [facility] Cardio-Pulmonary Department

## 2021-04-08 NOTE — Progress Notes (Signed)
POD # 1 s/p Hartmanns for perforated viscus septic shock Critically ill and septic On vasopressin and levophed Intubated AKI that required CVVHD  PE on the vent sedated Abd: ostomy dusky, no peritonitis, vac in place, drain serosanguineous  A/P Guarded prognosis  We will continue to follow Continue supportive care

## 2021-04-08 NOTE — Progress Notes (Signed)
Notified ICU NP Dewaine Conger of heart rate ranging from 115 to 130, he stated that he is aware and monitoring the patient, and to notify him if the heart rate increases. I have weaned Levophed down to 92mcg and that has improved her heart rate.

## 2021-04-08 NOTE — Progress Notes (Signed)
Initial Nutrition Assessment  DOCUMENTATION CODES:  Not applicable  INTERVENTION:  TPN to be initiated per pharmacy. Central line in place (right IJ). Pt is at high refeeding risk due to prolonged poor intake PTA. Would recommend adding 100mg  thiamine and 1g of folic acid x 3 days for refeeding risk. If labs indicate refeeding, would continue vitamin supplementation until electrolytes have stabilized. Would also recommend addition of 500mg  of vitamin c daily in TPN to aid in wound healing  NUTRITION DIAGNOSIS:  Increased nutrient needs related to wound healing as evidenced by estimated needs.  GOAL:  Patient will meet greater than or equal to 90% of their needs  MONITOR:  Vent status, Labs, Other (Comment) (TPN)  REASON FOR ASSESSMENT:  Malnutrition Screening Tool, Ventilator    ASSESSMENT:  75 year old female presented to [facility] after a 5-days of constant sharp/severe lower abdominal pain associated with daily episodes of nonbloody and nonbilious emesis. No significant p.o. intake x5 days and pt reports poor UOP x 4 days. PMH relevant for DM, HTN  Imaging in ED showed bowel perforation with marked inflammatory changes and severe pneumoperitoneum throughout the abdomen and pelvis.  Bowel ischemia could not be excluded.  Emergent surgical consultation was recommended.  Taken emergently for exploratory laparotomy 7/3. Surgery team notes abdomen was full of puss (~1.5 L). Underwent mobilization of the splenic flexure, extensive adhesiolysis, low anterior resection, descending colostomy, left salpingo-oophorectomy, placement of wound VAC. Remained intubated after surgery.  Pt sedated and intubated at the time of visit, no family present to provide a nutrition hx. RN at bedside reports that pt has started making a small amount of dark colored urine this AM. Generalized edema present to the majority of the body aside from the face where mild deficits are visualized.   Needed pressor support  decreasing this AM, MAP >65.   Discussed poor intake PTA with MD coupled with the fact pt now has elevated needs from her wounds and will likely require bowel rest for several more days from extensive surgery. TPN to be initiated today.   Patient is currently intubated on ventilator support MV: 9 L/min Temp (24hrs), Avg:96.8 F (36 C), Min:95 F (35 C), Max:99 F (37.2 C)  Propofol: 11.43 ml/hr (302kcal/d)   Intake/Output Summary (Last 24 hours) at 04/08/2021 1107 Last data filed at 04/08/2021 1000 Gross per 24 hour  Intake 5636.07 ml  Output 1160 ml  Net 4476.07 ml  Net IO Since Admission: 9,369.07 mL [04/08/21 1107]  Nutritionally Relevant Medications: Scheduled Meds:  insulin aspart  0-9 Units Subcutaneous Q4H   pantoprazole (PROTONIX) IV  40 mg Intravenous Daily   Continuous Infusions:  sodium chloride 50 mL/hr at 04/08/21 0600   anidulafungin     fentaNYL infusion INTRAVENOUS 100 mcg/hr (04/08/21 0600)   norepinephrine (LEVOPHED) Adult infusion 5 mcg/min (04/08/21 0600)   piperacillin-tazobactam (ZOSYN)  IV 2.25 g (04/08/21 0542)   propofol (DIPRIVAN) infusion 30 mcg/kg/min (04/08/21 0600)   sodium bicarbonate in 0.45 NS mL infusion 75 mL/hr at 04/08/21 0600   vasopressin 0.03 Units/min (04/08/21 0631)   Labs Reviewed: Na 132 BUN 71, creatinine 4.24 Phosphorus 7.4 SBG ranges from 133-205 mg/dL over the last 24 hours HbA1c 6.3% (03/05/21, [place])  NUTRITION - FOCUSED PHYSICAL EXAM: Flowsheet Row Most Recent Value  Orbital Region Mild depletion  Upper Arm Region No depletion  Thoracic and Lumbar Region No depletion  Buccal Region No depletion  Temple Region Mild depletion  Clavicle Bone Region No depletion  Clavicle and  Acromion Bone Region No depletion  Scapular Bone Region No depletion  Dorsal Hand No depletion  Patellar Region No depletion  Anterior Thigh Region No depletion  Posterior Calf Region No depletion  Edema (RD Assessment) Moderate   Hair Reviewed  Eyes Unable to assess  Mouth Unable to assess  Skin Reviewed  Nails Reviewed   Diet Order:   Diet Order             Diet NPO time specified  Diet effective now                   EDUCATION NEEDS:  No education needs have been identified at this time  Skin:  Skin Assessment: Skin Integrity Issues: Skin Integrity Issues:: Wound VAC Wound Vac: abdomen from surgical incision  Last BM:     Height:  Ht Readings from Last 1 Encounters:  04/05/2021 5\' 5"  (1.651 m)    Weight:  Wt Readings from Last 1 Encounters:  04/11/2021 63.5 kg    Ideal Body Weight:  56.8 kg  BMI:  Body mass index is 23.3 kg/m.  Estimated Nutritional Needs:  Kcal:  1800-2100 kcal/d Protein:  95-120 g/d Fluid:  1.8-2L/d   Ranell Patrick, RD, LDN Clinical Dietitian Pager on [place]

## 2021-04-08 NOTE — Consult Note (Signed)
[facility] CONSULT NOTE    Date: 04/08/2021                  Patient Name:  Lindsay [name]  MRN: [id number]  DOB: 04-15-1946  Age / Sex: 75 y.o., female         PCP: Rusty Aus, MD                 Service Requesting Consult: Critical care                 Reason for Consult: Acute kidney injury            History of Present Illness: Patient is a 75 y.o. female with a PMHx of hypertrophic cardiomyopathy, history of AICD placement, history of ventricular tachycardia, atrial fibrillation, history of diverticulitis, who was admitted to [facility] on 04/06/2021 for evaluation of diffuse abdominal pain.  She was found to have bowel perforation with significant pneumoperitoneum.  She was taken to the operating room urgently by Dr. Celine Ahr.  1.5 L of pus was found upon opening the abdomen.  We are now asked to see her for evaluation management of acute kidney injury.  She has known chronic kidney disease as well as her baseline creatinine is 1.0 with an EGFR of 54 on 03/05/2021.  Patient is now oliguric and creatinine up to 4.24 with an EGFR of 10.  Patient also with metabolic acidosis with serum bicarbonate of 19.  She is critically ill and on pressors as well as bicarbonate drip.   Medications: Outpatient medications: Medications Prior to Admission  Medication Sig Dispense Refill Last Dose   acetaminophen (TYLENOL) 500 MG tablet Take 500 mg by mouth every 6 (six) hours as needed.   prn at prn   Alum Hydroxide-Mag Carbonate (GAVISCON PO) Take 5-10 mLs by mouth 3 (three) times daily as needed. Plus or minus 3 hours from metoprolol succinate er   04/06/2021 at prn   Cyanocobalamin (VITAMIN B12 PO) Take 1,000 mcg by mouth daily.   04/06/2021 at 0630   esomeprazole (NEXIUM) 20 MG capsule Take 20 mg by mouth daily at 12 noon.   04/06/2021 at 0630   losartan (COZAAR) 50 MG tablet Take 50 mg by mouth 2 (two) times daily.   04/06/2021 at 1900   metoprolol succinate (TOPROL-XL) 100 MG 24 hr  tablet Take 100 mg by mouth 2 (two) times daily.   04/06/2021 at 1900   Multiple Vitamins tablet Take 2 tablets by mouth daily.   04/06/2021 at 0630   rosuvastatin (CRESTOR) 10 MG tablet Take 10 mg by mouth at bedtime.   04/06/2021 at 1300   XARELTO 20 MG TABS tablet Take 20 mg by mouth daily.   04/06/2021 at 1600   amiodarone (PACERONE) 200 MG tablet Take by mouth. (Patient not taking: No sig reported)   Not Taking   amoxicillin-clavulanate (AUGMENTIN) 875-125 MG tablet Take 1 tablet by mouth 2 (two) times daily. (Patient not taking: No sig reported)   Completed Course   aspirin 81 MG EC tablet Aspir-81 mg tablet,delayed release  Take 1 tablet every day by oral route. (Patient not taking: No sig reported)   Not Taking   beta carotene w/minerals (OCUVITE) tablet Take 1 tablet by mouth daily. (Patient not taking: No sig reported)   Not Taking   calcium citrate-vitamin D (CITRACAL+D) 315-200 MG-UNIT tablet Take by mouth. (Patient not taking: No sig reported)   Not Taking   ketoconazole (NIZORAL) 2 %  shampoo Shampoo into the scalp let sit 5-10 minutes then wash out. Use 3d/wk. (Patient not taking: Reported on 04/06/2021) 120 mL 6 Not Taking   MAGNESIUM OXIDE PO Take 165 mg by mouth daily. (Patient not taking: Reported on 04/23/2021)   Not Taking   mupirocin ointment (BACTROBAN) 2 % Apply to ulceration QD until healed. (Patient not taking: Reported on 04/30/2021) 22 g 3 Not Taking    Current medications: Current Facility-Administered Medications  Medication Dose Route Frequency Provider Last Rate Last Admin   0.9 %  sodium chloride infusion (Manually program via Guardrails IV Fluids)   Intravenous Once Tyler Pita, MD       0.9 %  sodium chloride infusion   Intravenous Continuous Tyler Pita, MD 50 mL/hr at 04/08/21 1000 Infusion Verify at 04/08/21 1000   albuterol (PROVENTIL) (2.5 MG/3ML) 0.083% nebulizer solution 2.5 mg  2.5 mg Nebulization Q6H Tyler Pita, MD   2.5 mg at 04/08/21 0758    amiodarone (NEXTERONE PREMIX) 360-4.14 MG/200ML-% (1.8 mg/mL) IV infusion  30 mg/hr Intravenous Continuous Rust-Chester, Britton L, NP 16.67 mL/hr at 04/08/21 0959 30 mg/hr at 04/08/21 0959   anidulafungin (ERAXIS) 100 mg in sodium chloride 0.9 % 100 mL IVPB  100 mg Intravenous Q24H Dallie Piles, RPH       chlorhexidine gluconate (MEDLINE KIT) (PERIDEX) 0.12 % solution 15 mL  15 mL Mouth Rinse BID Tyler Pita, MD   15 mL at 04/08/21 0843   Chlorhexidine Gluconate Cloth 2 % PADS 6 each  6 each Topical Q0600 Tyler Pita, MD       [START ON 04/09/2021] Chlorhexidine Gluconate Cloth 2 % PADS 6 each  6 each Topical Q0600 Kasa, Kurian, MD       fentaNYL 2595mg in NS 2551m(1047mml) infusion-PREMIX  0-400 mcg/hr Intravenous Continuous GonTyler PitaD 10 mL/hr at 04/08/21 1000 100 mcg/hr at 04/08/21 1000   insulin aspart (novoLOG) injection 0-9 Units  0-9 Units Subcutaneous Q4H Rust-Chester, Britton L, NP   3 Units at 04/08/21 1120   MEDLINE mouth rinse  15 mL Mouth Rinse 10 times per day GonTyler PitaD   15 mL at 04/08/21 1120   metoprolol tartrate (LOPRESSOR) 5 MG/5ML injection            midazolam (VERSED) injection 2 mg  2 mg Intravenous Q2H PRN GonTyler PitaD       norepinephrine (LEVOPHED) 4mg42m 250mL51mmix infusion  0-40 mcg/min Intravenous Titrated GonzaTyler Pita  Stopped at 04/08/21 0842   pantoprazole (PROTONIX) injection 40 mg  40 mg Intravenous Daily GonzaTyler Pita  40 mg at 04/08/21 1000   piperacillin-tazobactam (ZOSYN) IVPB 2.25 g  2.25 g Intravenous Q8H GrubbDallie Piles   Stopped at 04/08/21 0614 4680opofol (DIPRIVAN) 1000 MG/100ML infusion  5-80 mcg/kg/min Intravenous Titrated GonzaTyler Pita11.43 mL/hr at 04/08/21 1000 30 mcg/kg/min at 04/08/21 1000   sodium bicarbonate 75 mEq in sodium chloride 0.45 % 1,075 mL infusion   Intravenous Continuous GonzaTyler Pita75 mL/hr at 04/08/21 1001 New Bag at 04/08/21 1001    TPN ADULT (ION)   Intravenous Continuous TPN Kasa,Flora Lipps      vasopressin (PITRESSIN) 20 Units in sodium chloride 0.9 % 100 mL infusion-*FOR SHOCK*  0-0.04 Units/min Intravenous Continuous Rust-Chester, Britton L, NP 9 mL/hr at 04/08/21 0631 0.03 Units/min at 04/08/21 0631  Allergies: Allergies  Allergen Reactions   Amiodarone     Other reaction(s): Other (See Comments) Nausea, diarrhea, weight loss, anxiety, jitteriness   Levofloxacin Rash    Red palms, lips, forearms    Meloxicam Shortness Of Breath    Other reaction(s): Respiratory Distress   Amlodipine     Other reaction(s): Other (See Comments) Caused severe gingival hyperplasia   Amlodipine Besy-Benazepril Hcl     Severe gingival hyperplasia  Other reaction(s): Other (See Comments) Other reaction(s): Other   Beta Adrenergic Blockers     States severe fatigue  Other reaction(s): Other (See Comments) fatigue      Past Medical History: Past Medical History:  Diagnosis Date   Diabetes mellitus without complication ([place])    Hypertension      Past Surgical History: Past Surgical History:  Procedure Laterality Date   BREAST BIOPSY Right    2019-benign   BREAST EXCISIONAL BIOPSY Right 2018   REDUCTION MAMMAPLASTY Bilateral    2010     Family History: Family History  Problem Relation Age of Onset   Breast cancer Mother      Social History: Social History   Socioeconomic History   Marital status: Married    Spouse name: Not on file   Number of children: Not on file   Years of education: Not on file   Highest education level: Not on file  Occupational History   Not on file  Tobacco Use   Smoking status: Never   Smokeless tobacco: Never  Substance and Sexual Activity   Alcohol use: Never   Drug use: Never   Sexual activity: Not Currently  Other Topics Concern   Not on file  Social History Narrative   Not on file   Social Determinants of Health   Financial Resource Strain: Not on  file  Food Insecurity: Not on file  Transportation Needs: Not on file  Physical Activity: Not on file  Stress: Not on file  Social Connections: Not on file  Intimate Partner Violence: Not on file     Review of Systems: As per HPI  Vital Signs: Blood pressure (!) 116/55, pulse (!) 118, temperature (!) 95.72 F (35.4 C), resp. rate 20, height '5\' 5"'  (1.651 m), weight 63.5 kg, SpO2 98 %.  Weight trends: Filed Weights   04/27/2021 0310  Weight: 63.5 kg     Physical Exam: General: Critically ill-appearing  Head: Endotracheal tube in place  Eyes: Anicteric  Neck: Supple  Lungs:  Clear to auscultation, normal effort  Heart: S1S2 tachycardic  Abdomen:  Wound VAC in place  Extremities: Trace peripheral edema.  Neurologic: Intubated, sedated  Skin: No acute skin rash  Access: No hemodialysis access    Lab results: Basic Metabolic Panel: Recent Labs  Lab 04/21/2021 0348 04/22/2021 1256 04/08/21 0307  NA 128* 132* 132*  K 4.1 4.3 3.8  CL 96* 102 101  CO2 19* 19* 19*  GLUCOSE 146* 152* 233*  BUN 81* 70* 71*  CREATININE 4.81* 3.95* 4.24*  CALCIUM 8.7* 7.5* 7.2*  MG  --  2.0 1.9  PHOS  --   --  7.4*    Liver Function Tests: Recent Labs  Lab 04/29/2021 0348 04/09/2021 1256 04/08/21 0307  AST 56* 42*  --   ALT 41 21  --   ALKPHOS 70 28*  --   BILITOT 0.9 1.4*  --   PROT 6.6 3.8*  --   ALBUMIN 2.6* 2.3* 2.4*   Recent Labs  Lab 04/05/2021 0348  LIPASE 41   No results for input(s): AMMONIA in the last 168 hours.  CBC: Recent Labs  Lab 05/05/2021 0348 04/23/2021 1256 05/02/2021 1651 04/08/21 0307  WBC 19.0* 14.9*  --  21.3*  NEUTROABS  --  14.5*  --   --   HGB 12.0 6.7* 10.5* 9.0*  HCT 38.3 21.3* 31.3* 26.5*  MCV 79.6* 79.8*  --  81.8  PLT 461* 266  --  167    Cardiac Enzymes: No results for input(s): CKTOTAL, CKMB, CKMBINDEX, TROPONINI in the last 168 hours.  BNP: Invalid input(s): POCBNP  CBG: Recent Labs  Lab 04/12/2021 1938 04/06/2021 2340 04/08/21 0348  04/08/21 0717 04/08/21 1115  GLUCAP 133* 161* 205* 205* 233*    Microbiology: Results for orders placed or performed during the hospital encounter of 04/12/2021  Resp Panel by RT-PCR (Flu A&B, Covid) Nasopharyngeal Swab     Status: None   Collection Time: 04/18/2021  4:40 AM   Specimen: Nasopharyngeal Swab; Nasopharyngeal(NP) swabs in vial transport medium  Result Value Ref Range Status   SARS Coronavirus 2 by RT PCR NEGATIVE NEGATIVE Final    Comment: (NOTE) SARS-CoV-2 target nucleic acids are NOT DETECTED.  The SARS-CoV-2 RNA is generally detectable in upper respiratory specimens during the acute phase of infection. The lowest concentration of SARS-CoV-2 viral copies this assay can detect is 138 copies/mL. A negative result does not preclude SARS-Cov-2 infection and should not be used as the sole basis for treatment or other patient management decisions. A negative result may occur with  improper specimen collection/handling, submission of specimen other than nasopharyngeal swab, presence of viral mutation(s) within the areas targeted by this assay, and inadequate number of viral copies(<138 copies/mL). A negative result must be combined with clinical observations, patient history, and epidemiological information. The expected result is Negative.  Fact Sheet for Patients:  EntrepreneurPulse.com.au  Fact Sheet for Healthcare Providers:  IncredibleEmployment.be  This test is no t yet approved or cleared by the [place] FDA and  has been authorized for detection and/or diagnosis of SARS-CoV-2 by FDA under an Emergency Use Authorization (EUA). This EUA will remain  in effect (meaning this test can be used) for the duration of the COVID-19 declaration under Section 564(b)(1) of the Act, 21 U.S.C.section 360bbb-3(b)(1), unless the authorization is terminated  or revoked sooner.       Influenza A by PCR NEGATIVE NEGATIVE Final   Influenza B  by PCR NEGATIVE NEGATIVE Final    Comment: (NOTE) The Xpert Xpress SARS-CoV-2/FLU/RSV plus assay is intended as an aid in the diagnosis of influenza from Nasopharyngeal swab specimens and should not be used as a sole basis for treatment. Nasal washings and aspirates are unacceptable for Xpert Xpress SARS-CoV-2/FLU/RSV testing.  Fact Sheet for Patients: EntrepreneurPulse.com.au  Fact Sheet for Healthcare Providers: IncredibleEmployment.be  This test is not yet approved or cleared by the [place] FDA and has been authorized for detection and/or diagnosis of SARS-CoV-2 by FDA under an Emergency Use Authorization (EUA). This EUA will remain in effect (meaning this test can be used) for the duration of the COVID-19 declaration under Section 564(b)(1) of the Act, 21 U.S.C. section 360bbb-3(b)(1), unless the authorization is terminated or revoked.  Performed at [facility], [address] [place], [place] [address]   Blood culture (routine x 2)     Status: None (Preliminary result)   Collection Time: 04/26/2021  5:33 AM   Specimen: BLOOD  Result Value Ref Range Status   Specimen Description  BLOOD LEFT ANTECUBITAL  Final   Special Requests   Final    BOTTLES DRAWN AEROBIC AND ANAEROBIC Blood Culture adequate volume   Culture   Final    NO GROWTH 1 DAY Performed at [facility], [address] [place], [place] [address]    Report Status PENDING  Incomplete  Blood culture (routine x 2)     Status: None (Preliminary result)   Collection Time: 05/05/2021  7:40 AM   Specimen: BLOOD  Result Value Ref Range Status   Specimen Description BLOOD BLOOD RIGHT WRIST  Final   Special Requests   Final    BOTTLES DRAWN AEROBIC AND ANAEROBIC Blood Culture adequate volume   Culture   Final    NO GROWTH 1 DAY Performed at [facility], [address] [place], [place] [address]    Report Status PENDING  Incomplete  MRSA Next  Gen by PCR, Nasal     Status: None   Collection Time: 04/16/2021 12:50 PM   Specimen: Nasal Mucosa; Nasal Swab  Result Value Ref Range Status   MRSA by PCR Next Gen NOT DETECTED NOT DETECTED Final    Comment: (NOTE) The GeneXpert MRSA Assay (FDA approved for NASAL specimens only), is one component of a comprehensive MRSA colonization surveillance program. It is not intended to diagnose MRSA infection nor to guide or monitor treatment for MRSA infections. Test performance is not FDA approved in patients less than 35 years old. Performed at [facility], [address] [place],  [address]     Coagulation Studies: Recent Labs    04/15/2021 1256  LABPROT 33.4*  INR 3.3*    Urinalysis: Recent Labs    04/18/2021 0715  COLORURINE AMBER*  LABSPEC 1.020  PHURINE 5.0  GLUCOSEU 50*  HGBUR MODERATE*  BILIRUBINUR NEGATIVE  KETONESUR NEGATIVE  PROTEINUR 100*  NITRITE NEGATIVE  LEUKOCYTESUR TRACE*      Imaging: CT ABDOMEN PELVIS WO CONTRAST  Result Date: 04/23/2021 CLINICAL DATA:  Nonlocalized acute abdominal pain. Nausea and weakness. Decreased urination over the past 4 days EXAM: CT ABDOMEN AND PELVIS WITHOUT CONTRAST TECHNIQUE: Multidetector CT imaging of the abdomen and pelvis was performed following the standard protocol without IV contrast. COMPARISON:  None. FINDINGS: Lower chest: No acute abnormality. Hepatobiliary: Several similar-appearing fluid density lesions are noted within the liver with the largest measuring up to 2.3 cm. Findings likely represent hepatic cysts. Subcentimeter hypodensities are too small to characterize. No new focal lesion. No gallstones, gallbladder wall thickening, or pericholecystic fluid. No biliary dilatation. Pancreas: No focal lesion. Normal pancreatic contour. No surrounding inflammatory changes. No main pancreatic ductal dilatation. Spleen: Normal in size without focal abnormality. Adrenals/Urinary Tract: No adrenal nodule bilaterally.  No nephrolithiasis, no hydronephrosis, and no contour-deforming renal mass. No ureterolithiasis or hydroureter. The urinary bladder is decompressed with a urinary Foley catheter terminating within the lumen. Several foci of gas are noted within the urinary bladder lumen as well as within the Foley catheter. Stomach/Bowel: Stomach is within normal limits. Irregular bowel wall and haziness throughout the abdomen pelvis. Question scattered colonic diverticula of the skin colon. No definite bowel dilatation. The appendix not definitely identified. Vascular/Lymphatic: No abdominal aorta or iliac aneurysm. Severe atherosclerotic plaque of the aorta and its branches. No abdominal, pelvic, or inguinal lymphadenopathy. Reproductive: Uterus and bilateral adnexa are unremarkable. Other: Large volume pneumoperitoneum with at least small volume free fluid mesenteric fat stranding throughout the abdomen and pelvis. Musculoskeletal: No abdominal wall hernia or abnormality. No suspicious  lytic or blastic osseous lesions. No acute displaced fracture. Multilevel degenerative changes of the spine. IMPRESSION: 1. Bowel perforation with marked inflammatory changes and pneumoperitoneum throughout the abdomen and pelvis. Bowel ischemia not excluded. Markedly limited evaluation on this noncontrast study. Recommend emergent surgical consultation. 2. Multiple foci of gas noted within the urinary bladder lumen and Foley catheter tubing. Correlate with urinalysis to exclude infection. These results were called by telephone at the time of interpretation on 05/01/2021 at 5:19 am to provider [facility] , who verbally acknowledged these results. Electronically Signed   By: Iven Finn M.D.   On: 04/10/2021 05:28   Portable Chest xray  Result Date: 04/08/2021 CLINICAL DATA:  Respiratory failure. History of diabetes and hypertension. Follow-up exam. EXAM: PORTABLE CHEST 1 VIEW COMPARISON:  04/05/2021. FINDINGS: Cardiac silhouette normal  in size.  No mediastinal or hilar masses. Stable prominent bronchovascular markings bilaterally. No convincing pneumonia or pulmonary edema. Endotracheal tube, nasal/orogastric tube, right internal jugular central venous catheter and left anterior chest wall AICD are all stable. IMPRESSION: 1. No acute cardiopulmonary disease. 2. Support apparatus is stable and well positioned. Electronically Signed   By: Lajean Manes M.D.   On: 04/08/2021 07:46   DG Chest Port 1 View  Result Date: 04/10/2021 CLINICAL DATA:  Respiratory failure. EXAM: PORTABLE CHEST 1 VIEW COMPARISON:  May 30, 2020. FINDINGS: The heart size and mediastinal contours are within normal limits. Both lungs are clear. Endotracheal and nasogastric tubes are in good position. Left-sided pacemaker is unchanged in position. Right internal jugular catheter is in good position. The visualized skeletal structures are unremarkable. IMPRESSION: No active disease. Aortic Atherosclerosis (ICD10-I70.0). Electronically Signed   By: Marijo Conception M.D.   On: 04/16/2021 13:48     Assessment & Plan: Pt is a 75 y.o. female with a PMHx of hypertrophic cardiomyopathy, history of AICD placement, history of ventricular tachycardia, atrial fibrillation, history of diverticulitis, who was admitted to [facility] on 04/08/2021 for evaluation of diffuse abdominal pain.  She was found to have bowel perforation with significant pneumoperitoneum.  1.  Acute kidney injury secondary to ATN from septic shock. 2.  Metabolic acidosis. 3.  Acute respiratory failure. 4.  Hyponatremia. 5.  Bowel perforation status post exploratory laparotomy. 6.  Septic shock.  Plan: We were asked to see the patient for evaluation management of oliguric acute kidney injury with metabolic acidosis in the setting of bowel perforation status post exploratory laparotomy and septic shock.  Case discussed with pulmonary/critical care, nursing.  We attempted to contact the patient's family at 2  different numbers and were unable to reach the patient's husband.  We recommend initiation of CRRT at this time pending family decision.  She would need temporary dialysis catheter placement for that purpose.  We would recommend a 4K bath with net even target and frequent monitoring of serum electrolytes.  Overall prognosis quite guarded.

## 2021-04-08 NOTE — Progress Notes (Addendum)
Chronic / Paroxysmal Atrial Fibrillation with Rapid Ventricular Response PMHx: A-fib/a-flutter s/p ablation on Xarelto Initially appeared to be ST with frequent PAC's and multifocal PVC's. Metoprolol 5 mg given x 1 with minimal effect. Then patient into A-fib RVR with HR 130-150's - Amiodarone bolus & drip initiated - unable to anticoagulate due to recent abdominal surgery > will need clearance from surgery prior to initiation - Echocardiogram ordered - Continuous cardiac monitoring    Domingo Pulse Rust-Chester, AGACNP-BC Acute Care Nurse Practitioner [place]   [phone] / [phone] Please see Amion for pager details.

## 2021-04-08 NOTE — Progress Notes (Addendum)
PHARMACY - TOTAL PARENTERAL NUTRITION CONSULT NOTE   Indication:  extensive bowel surgery, poor PO intake PTA  Patient Measurements: Height: 5\' 5"  (165.1 cm) Weight: 63.5 kg (140 lb) IBW/kg (Calculated) : 57 TPN AdjBW (KG): 63.5 Body mass index is 23.3 kg/m.  Assessment: 75 year old female with minimal PO intake 5 days prior to admission. Imaging showed bowel perforation; patient was taken to the OR for exploratory laparotomy with low anterior resection and colostomy placement. Patient is intubated and sedated in the ICU. Required vasopressors, which have been titrated down. Episode of atrial fibrillation with RVR 7/4, started on amiodarone infusion. Patient has h/o afib s/p ablation on Xarelto; unable to anticoagulate at this time s/t recent surgery.  Glucose / Insulin: on sensitive SSI 24 hr glucose range 133-233 24 hr insulin requirements NA (started 7/4 am) Electrolytes: hyperphosphatemia Renal: Scr 4.81 (decreased UOP pta) Intake / Output; MIVF: net (+) 9 L GI Imaging: 7/3 CT abdomen/pelvis: Bowel perforation with marked inflammatory changes and pneumoperitoneum throughout the abdomen and pelvis. Bowel ischemia not excluded. GI Surgeries / Procedures:  7/3 ex lap with low anterior resection and colostomy placement  Central access: CVC right IJ TPN start date: 7/4  Nutritional Goals (per RD recommendation on 7/4): kCal: 1800-2100, Protein: 95-120 g, Fluid: 1.8-2 L/day Goal TPN rate is 80 mL/hr (provides 105 g of protein and 1747 kcals per day)  **Patient receiving an additional 273 kcal from propofol at current rate 30 mcg/kg/min**  Current Nutrition: NPO  Plan:  Start TPN at 40 mL/hr at 1800  Electrolytes in TPN: Na 50 mEq/L, K 20 mEq/L, Ca 5 mEq/L, Mg 5 mEq/L, and Phos 0 mmol/L. Cl:Ac 1:2  Add standard MVI and trace elements to TPN. Remove chromium for decreased renal function.  Patient is on sensitive SSI. Continue for now and monitor for necessary  adjustments.  Decrease sodium bicarb to 35 ml/hr at 1800 when TPN starts  Monitor TPN labs on Mon/Thurs at minimum, daily for at least 3 days as patient is high risk for refeeding  Tawnya Crook, PharmD 04/08/2021,11:15 AM

## 2021-04-09 ENCOUNTER — Encounter: Payer: Self-pay | Admitting: General Surgery

## 2021-04-09 DIAGNOSIS — J9601 Acute respiratory failure with hypoxia: Secondary | ICD-10-CM | POA: Diagnosis not present

## 2021-04-09 DIAGNOSIS — K631 Perforation of intestine (nontraumatic): Secondary | ICD-10-CM | POA: Diagnosis not present

## 2021-04-09 DIAGNOSIS — J9602 Acute respiratory failure with hypercapnia: Secondary | ICD-10-CM

## 2021-04-09 DIAGNOSIS — N179 Acute kidney failure, unspecified: Secondary | ICD-10-CM | POA: Diagnosis not present

## 2021-04-09 LAB — RENAL FUNCTION PANEL
Albumin: 2 g/dL — ABNORMAL LOW (ref 3.5–5.0)
Albumin: 1.9 g/dL — ABNORMAL LOW (ref 3.5–5.0)
Anion gap: 7 (ref 5–15)
Anion gap: 8 (ref 5–15)
BUN: 40 mg/dL — ABNORMAL HIGH (ref 8–23)
BUN: 25 mg/dL — ABNORMAL HIGH (ref 8–23)
CO2: 27 mmol/L (ref 22–32)
CO2: 26 mmol/L (ref 22–32)
Calcium: 7.3 mg/dL — ABNORMAL LOW (ref 8.9–10.3)
Calcium: 7.5 mg/dL — ABNORMAL LOW (ref 8.9–10.3)
Chloride: 103 mmol/L (ref 98–111)
Chloride: 102 mmol/L (ref 98–111)
Creatinine, Ser: 2.18 mg/dL — ABNORMAL HIGH (ref 0.44–1.00)
Creatinine, Ser: 1.48 mg/dL — ABNORMAL HIGH (ref 0.44–1.00)
GFR, Estimated: 23 mL/min — ABNORMAL LOW (ref >60)
GFR, Estimated: 37 mL/min — ABNORMAL LOW (ref >60)
Glucose, Bld: 233 mg/dL — ABNORMAL HIGH (ref 70–99)
Glucose, Bld: 218 mg/dL — ABNORMAL HIGH (ref 70–99)
Phosphorus: 3.3 mg/dL (ref 2.5–4.6)
Phosphorus: 1.9 mg/dL — ABNORMAL LOW (ref 2.5–4.6)
Potassium: 3.9 mmol/L (ref 3.5–5.1)
Potassium: 3.8 mmol/L (ref 3.5–5.1)
Sodium: 137 mmol/L (ref 135–145)
Sodium: 136 mmol/L (ref 135–145)

## 2021-04-09 LAB — BLOOD GAS, ARTERIAL
Acid-base deficit: 1.3 mmol/L (ref 0.0–2.0)
Bicarbonate: 23.7 mmol/L (ref 20.0–28.0)
FIO2: 0.24
MECHVT: 450 mL
Mechanical Rate: 20
O2 Saturation: 98.4 %
PEEP: 5 cmH2O
Patient temperature: 37
pCO2 arterial: 40 mmHg (ref 32.0–48.0)
pH, Arterial: 7.38 (ref 7.350–7.450)
pO2, Arterial: 115 mmHg — ABNORMAL HIGH (ref 83.0–108.0)

## 2021-04-09 LAB — PREALBUMIN: Prealbumin: <5 mg/dL — ABNORMAL LOW (ref 18–38)

## 2021-04-09 LAB — COMPREHENSIVE METABOLIC PANEL
ALT: 31 U/L (ref 0–44)
AST: 38 U/L (ref 15–41)
Albumin: 2 g/dL — ABNORMAL LOW (ref 3.5–5.0)
Alkaline Phosphatase: 44 U/L (ref 38–126)
Anion gap: 6 (ref 5–15)
BUN: 39 mg/dL — ABNORMAL HIGH (ref 8–23)
CO2: 27 mmol/L (ref 22–32)
Calcium: 7.2 mg/dL — ABNORMAL LOW (ref 8.9–10.3)
Chloride: 102 mmol/L (ref 98–111)
Creatinine, Ser: 2.33 mg/dL — ABNORMAL HIGH (ref 0.44–1.00)
GFR, Estimated: 21 mL/min — ABNORMAL LOW (ref >60)
Glucose, Bld: 238 mg/dL — ABNORMAL HIGH (ref 70–99)
Potassium: 3.8 mmol/L (ref 3.5–5.1)
Sodium: 135 mmol/L (ref 135–145)
Total Bilirubin: 1.3 mg/dL — ABNORMAL HIGH (ref 0.3–1.2)
Total Protein: 4.3 g/dL — ABNORMAL LOW (ref 6.5–8.1)

## 2021-04-09 LAB — CBC
HCT: 24.2 % — ABNORMAL LOW (ref 36.0–46.0)
Hemoglobin: 8.3 g/dL — ABNORMAL LOW (ref 12.0–15.0)
MCH: 28.4 pg (ref 26.0–34.0)
MCHC: 34.3 g/dL (ref 30.0–36.0)
MCV: 82.9 fL (ref 80.0–100.0)
Platelets: 131 10*3/uL — ABNORMAL LOW (ref 150–400)
RBC: 2.92 MIL/uL — ABNORMAL LOW (ref 3.87–5.11)
RDW: 18 % — ABNORMAL HIGH (ref 11.5–15.5)
WBC: 19.3 10*3/uL — ABNORMAL HIGH (ref 4.0–10.5)
nRBC: 0 % (ref 0.0–0.2)

## 2021-04-09 LAB — DIFFERENTIAL
Abs Immature Granulocytes: 0.32 10*3/uL — ABNORMAL HIGH (ref 0.00–0.07)
Basophils Absolute: 0 10*3/uL (ref 0.0–0.1)
Basophils Relative: 0 %
Eosinophils Absolute: 0 10*3/uL (ref 0.0–0.5)
Eosinophils Relative: 0 %
Immature Granulocytes: 2 %
Lymphocytes Relative: 1 %
Lymphs Abs: 0.2 10*3/uL — ABNORMAL LOW (ref 0.7–4.0)
Monocytes Absolute: 0.3 10*3/uL (ref 0.1–1.0)
Monocytes Relative: 2 %
Neutro Abs: 18.5 10*3/uL — ABNORMAL HIGH (ref 1.7–7.7)
Neutrophils Relative %: 95 %
Smear Review: NORMAL

## 2021-04-09 LAB — GLUCOSE, CAPILLARY
Glucose-Capillary: 261 mg/dL — ABNORMAL HIGH (ref 70–99)
Glucose-Capillary: 241 mg/dL — ABNORMAL HIGH (ref 70–99)
Glucose-Capillary: 234 mg/dL — ABNORMAL HIGH (ref 70–99)
Glucose-Capillary: 220 mg/dL — ABNORMAL HIGH (ref 70–99)
Glucose-Capillary: 184 mg/dL — ABNORMAL HIGH (ref 70–99)
Glucose-Capillary: 177 mg/dL — ABNORMAL HIGH (ref 70–99)
Glucose-Capillary: 225 mg/dL — ABNORMAL HIGH (ref 70–99)

## 2021-04-09 LAB — HEMOGLOBIN A1C
Hgb A1c MFr Bld: 5.7 % — ABNORMAL HIGH (ref 4.8–5.6)
Mean Plasma Glucose: 117 mg/dL

## 2021-04-09 LAB — TRIGLYCERIDES: Triglycerides: 224 mg/dL — ABNORMAL HIGH (ref <150)

## 2021-04-09 LAB — PROCALCITONIN: Procalcitonin: 11.6 ng/mL

## 2021-04-09 LAB — MAGNESIUM: Magnesium: 2.3 mg/dL (ref 1.7–2.4)

## 2021-04-09 LAB — LACTIC ACID, PLASMA: Lactic Acid, Venous: 1.3 mmol/L (ref 0.5–1.9)

## 2021-04-09 LAB — PHOSPHORUS: Phosphorus: 3.3 mg/dL (ref 2.5–4.6)

## 2021-04-09 MED ORDER — SODIUM CHLORIDE 0.9 % IV SOLN
0.5000 mg/h | INTRAVENOUS | Status: DC
  Administered 2021-04-09: 2 mg/h via INTRAVENOUS
  Administered 2021-04-10: 3 mg/h via INTRAVENOUS
  Administered 2021-04-10 – 2021-04-12 (×4): 4 mg/h via INTRAVENOUS
  Filled 2021-04-09 (×6): qty 5

## 2021-04-09 MED ORDER — HYDROMORPHONE BOLUS VIA INFUSION
0.2500 mg | INTRAVENOUS | Status: DC | PRN
  Administered 2021-04-11: 1 mg via INTRAVENOUS
  Filled 2021-04-09: qty 1

## 2021-04-09 MED ORDER — LACTATED RINGERS IV BOLUS
1000.0000 mL | Freq: Once | INTRAVENOUS | Status: AC
  Administered 2021-04-09: 1000 mL via INTRAVENOUS

## 2021-04-09 MED ORDER — AMIODARONE LOAD VIA INFUSION
150.0000 mg | Freq: Once | INTRAVENOUS | Status: AC
  Administered 2021-04-09: 150 mg via INTRAVENOUS
  Filled 2021-04-09: qty 83.34

## 2021-04-09 MED ORDER — POLYVINYL ALCOHOL 1.4 % OP SOLN
1.0000 [drp] | OPHTHALMIC | Status: DC | PRN
  Administered 2021-04-09 – 2021-04-20 (×24): 1 [drp] via OPHTHALMIC
  Filled 2021-04-09: qty 15

## 2021-04-09 MED ORDER — CHLORHEXIDINE GLUCONATE CLOTH 2 % EX PADS
6.0000 | MEDICATED_PAD | Freq: Every day | CUTANEOUS | Status: DC
  Administered 2021-04-09: 6 via TOPICAL

## 2021-04-09 MED ORDER — MIDAZOLAM HCL 2 MG/2ML IJ SOLN
2.0000 mg | INTRAMUSCULAR | Status: DC | PRN
  Administered 2021-04-09 – 2021-04-14 (×10): 2 mg via INTRAVENOUS
  Administered 2021-04-14: 4 mg via INTRAVENOUS
  Administered 2021-04-15 (×3): 2 mg via INTRAVENOUS
  Administered 2021-04-15: 1 mg via INTRAVENOUS
  Administered 2021-04-15: 2 mg via INTRAVENOUS
  Administered 2021-04-15 (×2): 4 mg via INTRAVENOUS
  Administered 2021-04-15: 2 mg via INTRAVENOUS
  Administered 2021-04-15: 1 mg via INTRAVENOUS
  Administered 2021-04-15 – 2021-04-18 (×8): 2 mg via INTRAVENOUS
  Administered 2021-04-18: 4 mg via INTRAVENOUS
  Administered 2021-04-18 – 2021-04-19 (×7): 2 mg via INTRAVENOUS
  Filled 2021-04-09 (×7): qty 2
  Filled 2021-04-09: qty 4
  Filled 2021-04-09 (×5): qty 2
  Filled 2021-04-09: qty 4
  Filled 2021-04-09 (×13): qty 2
  Filled 2021-04-09: qty 4
  Filled 2021-04-09: qty 2
  Filled 2021-04-09: qty 4
  Filled 2021-04-09 (×5): qty 2

## 2021-04-09 MED ORDER — INSULIN ASPART 100 UNIT/ML IJ SOLN
0.0000 [IU] | INTRAMUSCULAR | Status: DC
  Administered 2021-04-09: 3 [IU] via SUBCUTANEOUS
  Administered 2021-04-09: 5 [IU] via SUBCUTANEOUS
  Administered 2021-04-09 (×2): 3 [IU] via SUBCUTANEOUS
  Administered 2021-04-10: 5 [IU] via SUBCUTANEOUS
  Administered 2021-04-10: 3 [IU] via SUBCUTANEOUS
  Filled 2021-04-09 (×7): qty 1

## 2021-04-09 MED ORDER — TRAVASOL 10 % IV SOLN
INTRAVENOUS | Status: AC
  Filled 2021-04-09: qty 1056

## 2021-04-09 MED ORDER — HYDROMORPHONE HCL 1 MG/ML IJ SOLN
0.5000 mg | Freq: Once | INTRAMUSCULAR | Status: DC
Start: 2021-04-09 — End: 2021-04-12

## 2021-04-09 MED ORDER — AMIODARONE HCL IN DEXTROSE 360-4.14 MG/200ML-% IV SOLN
30.0000 mg/h | INTRAVENOUS | Status: DC
  Administered 2021-04-09 – 2021-04-12 (×7): 30 mg/h via INTRAVENOUS
  Filled 2021-04-09 (×7): qty 200

## 2021-04-09 MED ORDER — AMIODARONE HCL IN DEXTROSE 360-4.14 MG/200ML-% IV SOLN
60.0000 mg/h | INTRAVENOUS | Status: AC
  Administered 2021-04-09 (×2): 60 mg/h via INTRAVENOUS

## 2021-04-09 NOTE — Progress Notes (Signed)
Patient arterial line BP reading in the 210s. Arterial line zeroed to ensure accuracy ofr reading. Dilaudid bolus given for possible pain response. Benjamine Mola, NP at the bedside and instructed to place 78mcg of quad strength Levophed on hold. Approximately 20 minutes after placing on hold, patient BP reading in the 26S systolic on arterial line. Levophed gtt increased temporarily to obtain adequate BP. BP very labile and sensitive to medications. Will continue to monitor.

## 2021-04-09 NOTE — Progress Notes (Signed)
PHARMACY - TOTAL PARENTERAL NUTRITION CONSULT NOTE   Indication:  extensive bowel surgery, poor PO intake PTA  Patient Measurements: Height: 5\' 5"  (165.1 cm) Weight: 63.5 kg (140 lb) IBW/kg (Calculated) : 57 TPN AdjBW (KG): 63.5 Body mass index is 23.3 kg/m.  Assessment: 75 year old female with minimal PO intake 5 days prior to admission. Imaging showed bowel perforation; patient was taken to the OR for exploratory laparotomy with low anterior resection and colostomy placement. Patient is intubated and sedated in the ICU. Required vasopressors, which have been titrated down. Episode of atrial fibrillation with RVR 7/4, started on amiodarone infusion. Patient has h/o afib s/p ablation on Xarelto; unable to anticoagulate at this time s/t recent surgery. AKI with decreased UOP prior to hospitalization; started on CRRT 7/4.  Glucose / Insulin: on sensitive SSI 24 hr glucose range 204-261 24 hr insulin requirements 22 Electrolytes: hyperphosphatemia Renal: Scr 4.81 (decreased UOP pta), started on CRRT 7/4 Intake / Output; MIVF: net (+) 10 L GI Imaging: 7/3 CT abdomen/pelvis: Bowel perforation with marked inflammatory changes and pneumoperitoneum throughout the abdomen and pelvis. Bowel ischemia not excluded. GI Surgeries / Procedures:  7/3 ex lap with low anterior resection and colostomy placement  Central access: CVC right IJ TPN start date: 7/4  Nutritional Goals (per RD recommendation on 7/4): kCal: 1800-2100, Protein: 95-120 g, Fluid: 1.8-2 L/day Goal TPN rate is 80 mL/hr (provides 105 g of protein and 1689 kcals per day)  **Patient receiving an additional 360 kcal from propofol at current rate 40 mcg/kg/min, however plan is to stop propofol and transition to Versed PRN**  Current Nutrition: NPO  Plan:  Increase TPN to goal rate 80 mL/hr at 1800  Plan to stop propofol and transition to Versed PRN as able today. Lipids in TPN decreased to accommodate for kcal in propofol. Will  adjust TPN as indicated if patient able to remain off propofol.  Electrolytes in TPN: Na 50 mEq/L, K 0 mEq/L, Ca 5 mEq/L, Mg 5 mEq/L, and Phos 10 mmol/L. Cl:Ac 1:1 Patient started on CRRT 7/4. Plan to remain on CRRT per Nephrology. Remove potassium from TPN while patient remains on CRRT as potassium will be managed via CRRT fluid. Add phosphorus back to TPN as CRRT decreases phosphorus and patient's phosphorus has trended 7.4>3.3 in past 24 hours.  Add standard MVI and trace elements to TPN. Remove chromium for decreased renal function.  Increase insulin to moderate SSI  Sodium bicarb infusion discontinued  Monitor TPN labs on Mon/Thurs at minimum. Currently ordered BID while patient is on CRRT  Tawnya Crook, PharmD 04/09/2021,10:32 AM

## 2021-04-09 NOTE — Progress Notes (Signed)
Emptied colostomy of approximately 20 ml's of dark red stool

## 2021-04-09 NOTE — Plan of Care (Signed)
  Problem: Education: Goal: Knowledge of General Education information will improve Description: Including pain rating scale, medication(s)/side effects and non-pharmacologic comfort measures Outcome: Not Progressing   Problem: Health Behavior/Discharge Planning: Goal: Ability to manage health-related needs will improve Outcome: Not Progressing   Problem: Clinical Measurements: Goal: Ability to maintain clinical measurements within normal limits will improve Outcome: Not Progressing Goal: Will remain free from infection Outcome: Not Progressing Goal: Diagnostic test results will improve Outcome: Not Progressing Goal: Respiratory complications will improve Outcome: Not Progressing Goal: Cardiovascular complication will be avoided Outcome: Not Progressing   Problem: Activity: Goal: Risk for activity intolerance will decrease Outcome: Not Progressing   Problem: Nutrition: Goal: Adequate nutrition will be maintained Outcome: Not Progressing   Problem: Elimination: Goal: Will not experience complications related to bowel motility Outcome: Not Progressing   Problem: Pain Managment: Goal: General experience of comfort will improve Outcome: Not Progressing   Problem: Skin Integrity: Goal: Risk for impaired skin integrity will decrease Outcome: Not Progressing

## 2021-04-09 NOTE — Progress Notes (Addendum)
[place] Hospital Day(s): 2.   Post op day(s): 2 Days Post-Op.   Interval History:  Patient seen and examined Remains intubated and sedated Tachycardic; in atrial fibrillation; Amiodarone at 30 ml/hr Vasopressors: Levophed at 5 mcg/min & vasopressin 0.04 units Leukocytosis to 19.3K (from 21.3K) Hgb 8.3 Renal function making improvements; sCr - 2.33; UO - 62 ccs On CRRT No electrolyte derangements NGT with 20 ccs out Surgical drains  - RUQ: 90 ccs out; serosanguinous  - RLQ: 210 ccs out; serosanguinous She does have some blood tinged stool in colsotomy On TPN  Vital signs in last 24 hours: [min-max] current  Temp:  [93.9 F (34.4 C)-97.52 F (36.4 C)] 95.5 F (35.3 C) (07/05 0700) Pulse Rate:  [43-137] 117 (07/05 0700) Resp:  [18-24] 20 (07/04 1700) BP: (87-144)/(45-106) 87/74 (07/04 2300) SpO2:  [89 %-99 %] 95 % (07/05 0700) Arterial Line BP: (91-171)/(40-65) 144/55 (07/05 0700) FiO2 (%):  [24 %-30 %] 24 % (07/05 0254)     Height: 5\' 5"  (165.1 cm) Weight: 63.5 kg BMI (Calculated): 23.3   Intake/Output last 2 shifts:  07/04 0701 - 07/05 0700 In: 2893.5 [I.V.:2713.5; IV Piggyback:180] Out: 2091 [Urine:62; Emesis/NG output:20; Drains:300]   Physical Exam:  Constitutional: Intubated; Sedated HEENT: NGT in place  Respiratory: on ventilator  Cardiovascular: irregularly irregular; tachycardic Gastrointestinal: Soft, unable to assess tenderness; non-distended, no rebound/guarding. 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; patent; blood tinged stool in bag, Surgical drains in RUQ and RLQ with serosanguinous output Genitourinary: Foley in place  Integumentary: Midline wound with wound vac; good seal   Labs:  CBC Latest Ref Rng & Units 04/09/2021 04/08/2021 04/06/2021  WBC 4.0 - 10.5 K/uL 19.3(H) 21.3(H) -  Hemoglobin 12.0 - 15.0 g/dL 8.3(L) 9.0(L) 10.5(L)  Hematocrit 36.0 - 46.0 % 24.2(L) 26.5(L) 31.3(L)  Platelets 150 - 400 K/uL 131(L) 167 -    CMP Latest Ref Rng & Units 04/09/2021 04/09/2021 04/08/2021  Glucose 70 - 99 mg/dL 233(H) 238(H) 204(H)  BUN 8 - 23 mg/dL 40(H) 39(H) 79(H)  Creatinine 0.44 - 1.00 mg/dL 2.18(H) 2.33(H) 4.90(H)  Sodium 135 - 145 mmol/L 137 135 135  Potassium 3.5 - 5.1 mmol/L 3.9 3.8 3.9  Chloride 98 - 111 mmol/L 103 102 102  CO2 22 - 32 mmol/L 27 27 16(L)  Calcium 8.9 - 10.3 mg/dL 7.3(L) 7.2(L) 7.1(L)  Total Protein 6.5 - 8.1 g/dL - 4.3(L) -  Total Bilirubin 0.3 - 1.2 mg/dL - 1.3(H) -  Alkaline Phos 38 - 126 U/L - 44 -  AST 15 - 41 U/L - 38 -  ALT 0 - 44 U/L - 31 -    Imaging studies: No new pertinent imaging studies   Assessment/Plan:  75 y.o. critically ill female 2 Days Post-Op s/p exploratory laparotomy, hartman's procedure, salpingo-oopherectomy, and placement of wound vac for perforated diverticulitis with frankly purulent peritonitis   - Appreciate PCCM assistance; ventilator and vasopressor support pre their service  - Appreciate nephrology assistance; CRRT - Continue TPN; NPO + IVF support - Continue IV Abx (Zosyn) - Monitor abdominal examination; on-going colostomy function   - Midline wound care: Wound vac; MWF schedule   - Pain control prn - Monitor leukocytosis   - Monitor renal function    All of the above findings and recommendations were discussed with the medical team   -- Edison Simon, PA-C [facility] 04/09/2021, 7:27 AM [phone] M-F: 7am - 4pm

## 2021-04-09 NOTE — Progress Notes (Signed)
Hear rate is still ranging from 110 to 130vernight, , levophed is at 5 mcg now, I had weaned it down to 1 Mcg, but her blood pressure dropped so I increased it back to 62mcg Tolerated CRRT overnight, warming blanket in place along a blanket over her head to decrease hypothermia.

## 2021-04-09 NOTE — Progress Notes (Signed)
NAME:  Lindsay [name], MRN:  [id number], DOB:  04-Oct-1946, LOS: 2 ADMISSION DATE:  04/29/2021  History of Present Illness:  75 year old lifelong never smoker with a history as noted below, who presented to [facility] today after a 5-day history of constant sharp and severe bilateral lower abdominal pain associated with daily episodes of nonbloody and nonbilious emesis.  The patient had not been able to take significant p.o. intake for approximately 5 days.  She endorsed to the emergency room physician that she had not made urine in 4 days PTA.  She presented to [facility] in the early hours of the morning feeling that she was in a pass out and feeling extremely weak.  She did not endorse any shortness of breath fevers or chills.  She stated that she was having bowel movements.  No constipation or diarrhea.  On evaluation at the emergency room the patient was noted to be tachypneic and described as being pale and sick appearing.  She was noted to have diffusely tender abdomen particular in the lower quadrants but described not to be having any rebound.  A CT abdomen and pelvis was obtained and this showed bowel perforation with marked inflammatory changes and severe pneumoperitoneum throughout the abdomen and pelvis.  Bowel ischemia could not be excluded.  Emergent surgical consultation was recommended.   This point Dr. Fredirick Maudlin was consulted emergently and the patient went emergently to the operating room.  Dr. Celine Ahr performed an exploratory laparotomy, mobilization of the splenic flexure, extensive adhesion lysis, low anterior resection, descending colostomy, left salpingo-oophorectomy and placement of a wound VAC.  Dr. Celine Ahr described that all upon entering the abdomen roughly 1.5 L of pus were released.  There was no obvious feculent drainage.  During the procedure a small splenic laceration occur during the exploration which was managed with topical hemostatic agents.  Patient also had placement of multiple  drains in the abdomen.  The patient had central line arterial line placed prior to surgery.  Patient received appropriate volume resuscitation with crystalloid and colloid and was transferred to the intensive care unit after the procedure and mechanically ventilated status.  She was at the time requiring pressors in the form of norepinephrine.  At that point PCCM was consulted for management of the patient's critical illness.   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  She has a history of A. fib and is on amiodarone and anticoagulation with Xarelto.   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 Fib Diverticulitis   Significant Hospital Events: Including procedures, antibiotic start and stop dates in addition to other pertinent events  04/15/2021 admitted post emergent ex lap for perforated viscus, peritonitis, severe sepsis 04/17/2021 piperacillin tazobactam and Eraxis initiated 7/4 remains critically ill, severe multiorgan failure 7/5 remains critically ill, severe shock, on CRRT     Interim History / Subjective:  Remains critically ill Multiorgan failure Severe septic shock      Antimicrobials:   Antibiotics Given (last 72 hours)     Date/Time Action Medication Dose Rate   05/03/2021 0528 New Bag/Given   piperacillin-tazobactam (ZOSYN) IVPB 3.375 g 3.375 g 100 mL/hr   04/06/2021 1333 New Bag/Given   piperacillin-tazobactam (ZOSYN) IVPB 2.25 g 2.25 g 100 mL/hr   04/18/2021 2206 New Bag/Given   piperacillin-tazobactam (ZOSYN) IVPB 2.25 g 2.25 g 100 mL/hr   04/08/21 0542 New Bag/Given   piperacillin-tazobactam (ZOSYN)  IVPB 2.25 g 2.25 g 100 mL/hr   04/08/21 1334 New Bag/Given   piperacillin-tazobactam (ZOSYN) IVPB 2.25 g 2.25 g 100 mL/hr   04/08/21 2005 New Bag/Given   piperacillin-tazobactam (ZOSYN) IVPB 3.375 g 3.375 g 100 mL/hr   04/09/21 0246 New Bag/Given    piperacillin-tazobactam (ZOSYN) IVPB 3.375 g 3.375 g 100 mL/hr           Objective   Blood pressure (!) 87/74, pulse (!) 127, temperature (!) 95.5 F (35.3 C), resp. rate 20, height 5\' 5"  (1.651 m), weight 63.5 kg, SpO2 95 %.    Vent Mode: PRVC FiO2 (%):  [24 %-30 %] 24 % Set Rate:  [20 bmp] 20 bmp Vt Set:  [450 mL] 450 mL PEEP:  [5 cmH20] 5 cmH20   Intake/Output Summary (Last 24 hours) at 04/09/2021 0711 Last data filed at 04/09/2021 4010 Gross per 24 hour  Intake 2893.49 ml  Output 2061 ml  Net 832.49 ml   Filed Weights   04/10/2021 0310  Weight: 63.5 kg      REVIEW OF SYSTEMS  PATIENT IS UNABLE TO PROVIDE COMPLETE REVIEW OF SYSTEMS DUE TO SEVERE CRITICAL ILLNESS AND TOXIC METABOLIC ENCEPHALOPATHY   PHYSICAL EXAMINATION:  GENERAL:critically ill appearing, +resp distress HEAD: Normocephalic, atraumatic.  EYES: Pupils equal, round, reactive to light.  No scleral icterus.  MOUTH: Moist mucosal membrane. NECK: Supple. PULMONARY: +rhonchi, +wheezing CARDIOVASCULAR: S1 and S2. Regular rate and rhythm. No murmurs, rubs, or gallops.  GASTROINTESTINAL: +distended drains in place MUSCULOSKELETAL: No swelling, clubbing, or edema.  NEUROLOGIC: obtunded SKIN:intact,warm,dry   Labs/imaging that I havepersonally reviewed  (right click and "Reselect all SmartList Selections" daily)      ASSESSMENT AND PLAN SYNOPSIS    75 yo female with severe bowel perforation SEPSIS PRESENT ON ADMISSION and ABD abscess with severe septic shock s/p ex lap with progressive multiorgan failure   Severe ACUTE Hypoxic and Hypercapnic Respiratory Failure -continue Mechanical Ventilator support -continue Bronchodilator Therapy -Wean Fio2 and PEEP as tolerated -VAP/VENT bundle implementation Unable to wean from vent    CARDIAC ICU monitoring   ACUTE KIDNEY INJURY/Renal Failure -continue Foley Catheter-assess need -Avoid nephrotoxic agents -Follow urine output, BMP -Ensure  adequate renal perfusion, optimize oxygenation -Renal dose medications On CRRT   NEUROLOGY Acute toxic metabolic encephalopathy, need for sedation Goal RASS -2 to -3   SEPTIC SHOCK -use vasopressors to keep MAP>65 as needed -follow ABG and LA -follow up cultures -emperic ABX -consider stress dose steroids -aggressive IV fluid resuscitation  INFECTIOUS DISEASE Peritonitis with severe sepsis Bowel perforation Severe sepsis -continue antibiotics as prescribed -follow up cultures    ENDO - ICU hypoglycemic\Hyperglycemia protocol -check FSBS per protocol   GI GI PROPHYLAXIS as indicated  NUTRITIONAL STATUS TPN STARTED Constipation protocol as indicated   ELECTROLYTES -follow labs as needed -replace as needed -pharmacy consultation and following    SEVERE METABOLIC ACIDOSIS Peritonitis with severe sepsis Bowel perforation Severe sepsis Lactic acidosis due to severe sepsis Sodium bicarb infusion     History of hypertrophic cardiomyopathy Pacer AICD in situ Chronic atrial fibrillation Obtain 2D echo Continuous cardiac monitoring Amiodarone infusion if develops RVR Cardiology consultation as needed  Best practice (right click and "Reselect all SmartList Selections" daily)  Diet:  NPO Pain/Anxiety/Delirium protocol (if indicated): Yes (RASS goal -2) VAP protocol (if indicated): Yes DVT prophylaxis: Subcutaneous Heparin GI prophylaxis: PPI Glucose control:  SSI Yes Central venous access:  Yes, and it is still needed Arterial line:  Yes, and it is still  needed Foley:  Yes, and it is still needed Mobility:  bed rest  PT consulted: N/A Code Status:  DNR Disposition: ICU  Labs   CBC: Recent Labs  Lab 04/28/2021 0348 04/22/2021 1256 05/01/2021 1651 04/08/21 0307 04/09/21 0521  WBC 19.0* 14.9*  --  21.3* 19.3*  NEUTROABS  --  14.5*  --   --  18.5*  HGB 12.0 6.7* 10.5* 9.0* 8.3*  HCT 38.3 21.3* 31.3* 26.5* 24.2*  MCV 79.6* 79.8*  --  81.8 82.9  PLT  461* 266  --  167 131*    Basic Metabolic Panel: Recent Labs  Lab 05/05/2021 0348 04/17/2021 1256 04/08/21 0307 04/08/21 1650 04/09/21 0521  NA 128* 132* 132* 135 137  135  K 4.1 4.3 3.8 3.9 3.9  3.8  CL 96* 102 101 102 103  102  CO2 19* 19* 19* 16* 27  27  GLUCOSE 146* 152* 233* 204* 233*  238*  BUN 81* 70* 71* 79* 40*  39*  CREATININE 4.81* 3.95* 4.24* 4.90* 2.18*  2.33*  CALCIUM 8.7* 7.5* 7.2* 7.1* 7.3*  7.2*  MG  --  2.0 1.9  --  2.3  PHOS  --   --  7.4* 7.6* 3.3  3.3   GFR: Estimated Creatinine Clearance: 18.8 mL/min (A) (by C-G formula based on SCr of 2.33 mg/dL (H)). Recent Labs  Lab 04/29/2021 0348 04/06/2021 0527 04/05/2021 0726 04/14/2021 1256 04/08/21 0307 04/09/21 0521  PROCALCITON  --   --   --  15.49 22.12 11.60  WBC 19.0*  --   --  14.9* 21.3* 19.3*  LATICACIDVEN  --  2.8* 2.9* 2.9* 1.4  --     Liver Function Tests: Recent Labs  Lab 04/19/2021 0348 04/28/2021 1256 04/08/21 0307 04/08/21 1650 04/09/21 0521  AST 56* 42*  --   --  38  ALT 41 21  --   --  31  ALKPHOS 70 28*  --   --  44  BILITOT 0.9 1.4*  --   --  1.3*  PROT 6.6 3.8*  --   --  4.3*  ALBUMIN 2.6* 2.3* 2.4* 2.2* 2.0*  2.0*   Recent Labs  Lab 04/28/2021 0348  LIPASE 41   No results for input(s): AMMONIA in the last 168 hours.  ABG    Component Value Date/Time   PHART 7.38 04/09/2021 0341   PCO2ART 40 04/09/2021 0341   PO2ART 115 (H) 04/09/2021 0341   HCO3 23.7 04/09/2021 0341   ACIDBASEDEF 1.3 04/09/2021 0341   O2SAT 98.4 04/09/2021 0341     Coagulation Profile: Recent Labs  Lab 04/28/2021 1256  INR 3.3*    Cardiac Enzymes: No results for input(s): CKTOTAL, CKMB, CKMBINDEX, TROPONINI in the last 168 hours.  HbA1C: Hgb A1c MFr Bld  Date/Time Value Ref Range Status  04/22/2021 04:51 PM 5.7 (H) 4.8 - 5.6 % Final    Comment:    (NOTE)         Prediabetes: 5.7 - 6.4         Diabetes: >6.4         Glycemic control for adults with diabetes: <7.0     CBG: Recent Labs   Lab 04/08/21 1115 04/08/21 1557 04/08/21 1952 04/09/21 0105 04/09/21 0319  GLUCAP 233* 192* 215* 261* 241*    Allergies Allergies  Allergen Reactions   Amiodarone     Other reaction(s): Other (See Comments) Nausea, diarrhea, weight loss, anxiety, jitteriness   Levofloxacin Rash    Red  palms, lips, forearms    Meloxicam Shortness Of Breath    Other reaction(s): Respiratory Distress   Amlodipine     Other reaction(s): Other (See Comments) Caused severe gingival hyperplasia   Amlodipine Besy-Benazepril Hcl     Severe gingival hyperplasia  Other reaction(s): Other (See Comments) Other reaction(s): Other   Beta Adrenergic Blockers     States severe fatigue  Other reaction(s): Other (See Comments) fatigue       DVT/GI PRX  assessed I Assessed the need for Labs I Assessed the need for Foley I Assessed the need for Central Venous Line Family Discussion when available I Assessed the need for Mobilization I made an Assessment of medications to be adjusted accordingly Safety Risk assessment completed  CASE DISCUSSED IN MULTIDISCIPLINARY ROUNDS WITH ICU TEAM     Critical Care Time devoted to patient care services described in this note is 55 minutes.  Critical care was necessary to treat or prevent imminent or life-threatening deterioration. Overall, patient is critically ill, prognosis is guarded.  Patient with Multiorgan failure and at high risk for cardiac arrest and death.    Corrin Parker, M.D.  [facility]  Medical Director [place] Director [facility] Cardio-Pulmonary Department

## 2021-04-09 NOTE — Progress Notes (Signed)
Pt w/very labile BP this shift, she seems very sensitive to Versed (pressure drops very quickly to hypotension) and Vasopressin (pressure accelerates rapidly to hypertension).  Per Dr Mortimer Fries 1 liter of LR bolused and not pulled off w/ CRRT d/t clinically appearing dry, so pt is 1 liter+ this shift.   Rhythm Afib most of shift, although SR observed transiently.  Defib spikes observed during labile periods, but patient was awake and seemed to indicate she did not feel any shocks.  HR high as 145 at times.  Per Dr Ubaldo Glassing amiodarone protocol restarted w/ bolus and 6 hours at 60 mg/hr.  Also attempted to treat with analgesia as pt indicated she was in pain. When propofol was turned off pt awoke quickly and appeared miserable.  When asked if she was in pain she quickly nodded "yes."  Fentanyl infusing at max rate and patient remained awake, requiring PRN versed which dropped her BP, so fentanyl was replaced with Dilaudid. No drainage from wound vac, minimal JP drain output, no measurable UOP, NGT to suction w/ no output.   Colostomy bag w moderate amt dark soft stool and frank blood.  Bag/appliance changed.  NPWV dressing clean and dry, foam hard to the touch.  No bowel sounds auscultated.

## 2021-04-09 NOTE — Progress Notes (Signed)
[facility]  ROUNDING NOTE   Subjective:  Patient remains critically ill at the moment. Has been initiated on CRRT. Continues to require multiple pressors as well as mechanical ventilation.   Objective:  Vital signs in last 24 hours:  Temp:  [93.9 F (34.4 C)-97.52 F (36.4 C)] 94.64 F (34.8 C) (07/05 1000) Pulse Rate:  [43-137] 66 (07/05 0900) Resp:  [20-24] 20 (07/05 0800) BP: (87-141)/(45-106) 87/74 (07/04 2300) SpO2:  [89 %-99 %] 98 % (07/05 0900) Arterial Line BP: (91-161)/(40-64) 124/49 (07/05 1000) FiO2 (%):  [24 %] 24 % (07/05 0800)  Weight change:  Filed Weights   04/15/2021 0310  Weight: 63.5 kg    Intake/Output: I/O last 3 completed shifts: In: 5508.3 [I.V.:5128.3; IV Piggyback:380] Out: 2371 [Urine:62; Emesis/NG output:90; Drains:510; RUEAV:4098]   Intake/Output this shift:  Total I/O In: 569.8 [I.V.:489.9; Other:30; IV Piggyback:49.9] Out: 39 [Drains:22; Other:583]  Physical Exam: General: Critically ill-appearing  Head: Normocephalic, atraumatic.  Endotracheal tube in place  Eyes: Anicteric  Neck: Supple  Lungs:  Clear to auscultation, vent assisted  Heart: S1S2 tachycardic  Abdomen:  Surgical wound noted, wound VAC in place  Extremities: Trace peripheral edema.  Neurologic: Intubated, sedated  Skin: No acute rash  Access: Left IJ temporary dialysis catheter    Basic Metabolic Panel: Recent Labs  Lab 04/14/2021 0348 04/06/2021 1256 04/08/21 0307 04/08/21 1650 04/09/21 0521  NA 128* 132* 132* 135 137  135  K 4.1 4.3 3.8 3.9 3.9  3.8  CL 96* 102 101 102 103  102  CO2 19* 19* 19* 16* 27  27  GLUCOSE 146* 152* 233* 204* 233*  238*  BUN 81* 70* 71* 79* 40*  39*  CREATININE 4.81* 3.95* 4.24* 4.90* 2.18*  2.33*  CALCIUM 8.7* 7.5* 7.2* 7.1* 7.3*  7.2*  MG  --  2.0 1.9  --  2.3  PHOS  --   --  7.4* 7.6* 3.3  3.3    Liver Function Tests: Recent Labs  Lab 04/27/2021 0348 04/11/2021 1256 04/08/21 0307 04/08/21 1650  04/09/21 0521  AST 56* 42*  --   --  38  ALT 41 21  --   --  31  ALKPHOS 70 28*  --   --  44  BILITOT 0.9 1.4*  --   --  1.3*  PROT 6.6 3.8*  --   --  4.3*  ALBUMIN 2.6* 2.3* 2.4* 2.2* 2.0*  2.0*   Recent Labs  Lab 04/17/2021 0348  LIPASE 41   No results for input(s): AMMONIA in the last 168 hours.  CBC: Recent Labs  Lab 04/19/2021 0348 04/06/2021 1256 04/28/2021 1651 04/08/21 0307 04/09/21 0521  WBC 19.0* 14.9*  --  21.3* 19.3*  NEUTROABS  --  14.5*  --   --  18.5*  HGB 12.0 6.7* 10.5* 9.0* 8.3*  HCT 38.3 21.3* 31.3* 26.5* 24.2*  MCV 79.6* 79.8*  --  81.8 82.9  PLT 461* 266  --  167 131*    Cardiac Enzymes: No results for input(s): CKTOTAL, CKMB, CKMBINDEX, TROPONINI in the last 168 hours.  BNP: Invalid input(s): POCBNP  CBG: Recent Labs  Lab 04/08/21 1557 04/08/21 1952 04/09/21 0105 04/09/21 0319 04/09/21 0725  GLUCAP 192* 215* 261* 241* 18*    Microbiology: Results for orders placed or performed during the hospital encounter of 04/05/2021  Resp Panel by RT-PCR (Flu A&B, Covid) Nasopharyngeal Swab     Status: None   Collection Time: 05/01/2021  4:40 AM  Specimen: Nasopharyngeal Swab; Nasopharyngeal(NP) swabs in vial transport medium  Result Value Ref Range Status   SARS Coronavirus 2 by RT PCR NEGATIVE NEGATIVE Final    Comment: (NOTE) SARS-CoV-2 target nucleic acids are NOT DETECTED.  The SARS-CoV-2 RNA is generally detectable in upper respiratory specimens during the acute phase of infection. The lowest concentration of SARS-CoV-2 viral copies this assay can detect is 138 copies/mL. A negative result does not preclude SARS-Cov-2 infection and should not be used as the sole basis for treatment or other patient management decisions. A negative result may occur with  improper specimen collection/handling, submission of specimen other than nasopharyngeal swab, presence of viral mutation(s) within the areas targeted by this assay, and inadequate number of  viral copies(<138 copies/mL). A negative result must be combined with clinical observations, patient history, and epidemiological information. The expected result is Negative.  Fact Sheet for Patients:  EntrepreneurPulse.com.au  Fact Sheet for Healthcare Providers:  IncredibleEmployment.be  This test is no t yet approved or cleared by the [place] FDA and  has been authorized for detection and/or diagnosis of SARS-CoV-2 by FDA under an Emergency Use Authorization (EUA). This EUA will remain  in effect (meaning this test can be used) for the duration of the COVID-19 declaration under Section 564(b)(1) of the Act, 21 U.S.C.section 360bbb-3(b)(1), unless the authorization is terminated  or revoked sooner.       Influenza A by PCR NEGATIVE NEGATIVE Final   Influenza B by PCR NEGATIVE NEGATIVE Final    Comment: (NOTE) The Xpert Xpress SARS-CoV-2/FLU/RSV plus assay is intended as an aid in the diagnosis of influenza from Nasopharyngeal swab specimens and should not be used as a sole basis for treatment. Nasal washings and aspirates are unacceptable for Xpert Xpress SARS-CoV-2/FLU/RSV testing.  Fact Sheet for Patients: EntrepreneurPulse.com.au  Fact Sheet for Healthcare Providers: IncredibleEmployment.be  This test is not yet approved or cleared by the [place] FDA and has been authorized for detection and/or diagnosis of SARS-CoV-2 by FDA under an Emergency Use Authorization (EUA). This EUA will remain in effect (meaning this test can be used) for the duration of the COVID-19 declaration under Section 564(b)(1) of the Act, 21 U.S.C. section 360bbb-3(b)(1), unless the authorization is terminated or revoked.  Performed at [facility], [address] [place], [place] [address]   Blood culture (routine x 2)     Status: None (Preliminary result)   Collection Time: 04/21/2021  5:33 AM    Specimen: BLOOD  Result Value Ref Range Status   Specimen Description BLOOD LEFT ANTECUBITAL  Final   Special Requests   Final    BOTTLES DRAWN AEROBIC AND ANAEROBIC Blood Culture adequate volume   Culture   Final    NO GROWTH 2 DAYS Performed at [facility], [address], [place], [place] [address]    Report Status PENDING  Incomplete  Blood culture (routine x 2)     Status: None (Preliminary result)   Collection Time: 04/28/2021  7:40 AM   Specimen: BLOOD  Result Value Ref Range Status   Specimen Description BLOOD BLOOD RIGHT WRIST  Final   Special Requests   Final    BOTTLES DRAWN AEROBIC AND ANAEROBIC Blood Culture adequate volume   Culture   Final    NO GROWTH 2 DAYS Performed at [facility], [address], [place], [place] [address]    Report Status PENDING  Incomplete  MRSA Next Gen by PCR, Nasal     Status: None  Collection Time: 04/30/2021 12:50 PM   Specimen: Nasal Mucosa; Nasal Swab  Result Value Ref Range Status   MRSA by PCR Next Gen NOT DETECTED NOT DETECTED Final    Comment: (NOTE) The GeneXpert MRSA Assay (FDA approved for NASAL specimens only), is one component of a comprehensive MRSA colonization surveillance program. It is not intended to diagnose MRSA infection nor to guide or monitor treatment for MRSA infections. Test performance is not FDA approved in patients less than 33 years old. Performed at [facility], [address] [place], [place] [address]     Coagulation Studies: Recent Labs    04/08/2021 1256  LABPROT 33.4*  INR 3.3*    Urinalysis: Recent Labs    04/11/2021 0715  COLORURINE AMBER*  LABSPEC 1.020  PHURINE 5.0  GLUCOSEU 50*  HGBUR MODERATE*  BILIRUBINUR NEGATIVE  KETONESUR NEGATIVE  PROTEINUR 100*  NITRITE NEGATIVE  LEUKOCYTESUR TRACE*      Imaging: DG Chest Port 1 View  Result Date: 04/08/2021 CLINICAL DATA:  Central line placement. EXAM: PORTABLE CHEST 1 VIEW COMPARISON:  Radiograph  earlier today. FINDINGS: Large-bore left-sided internal jugular catheter is low in positioning at the atrial IVC junction, at or possibly below the diaphragm. No pneumothorax. Right internal central line, endotracheal tube, enteric tube, left-sided pacemaker remain in place. Stable heart size and mediastinal contours. Mild hazy left lung base opacity likely small pleural effusion and atelectasis. Presumed surgical drain in the left upper quadrant. No pulmonary edema. IMPRESSION: 1. Left internal jugular central line with tip low in positioning at the atrial IVC junction, either at or possibly below the diaphragm. No pneumothorax. 2. Remaining support apparatus are stable. 3. Hazy left lung base opacity likely small pleural effusion and atelectasis. Electronically Signed   By: Keith Rake M.D.   On: 04/08/2021 16:54   Portable Chest xray  Result Date: 04/08/2021 CLINICAL DATA:  Respiratory failure. History of diabetes and hypertension. Follow-up exam. EXAM: PORTABLE CHEST 1 VIEW COMPARISON:  04/10/2021. FINDINGS: Cardiac silhouette normal in size.  No mediastinal or hilar masses. Stable prominent bronchovascular markings bilaterally. No convincing pneumonia or pulmonary edema. Endotracheal tube, nasal/orogastric tube, right internal jugular central venous catheter and left anterior chest wall AICD are all stable. IMPRESSION: 1. No acute cardiopulmonary disease. 2. Support apparatus is stable and well positioned. Electronically Signed   By: Lajean Manes M.D.   On: 04/08/2021 07:46   DG Chest Port 1 View  Result Date: 04/25/2021 CLINICAL DATA:  Respiratory failure. EXAM: PORTABLE CHEST 1 VIEW COMPARISON:  May 30, 2020. FINDINGS: The heart size and mediastinal contours are within normal limits. Both lungs are clear. Endotracheal and nasogastric tubes are in good position. Left-sided pacemaker is unchanged in position. Right internal jugular catheter is in good position. The visualized skeletal structures  are unremarkable. IMPRESSION: No active disease. Aortic Atherosclerosis (ICD10-I70.0). Electronically Signed   By: Marijo Conception M.D.   On: 05/01/2021 13:48   ECHOCARDIOGRAM COMPLETE  Result Date: 04/08/2021    ECHOCARDIOGRAM REPORT   Patient Name:   Lindsay [name] Date of Exam: 04/08/2021 Medical Rec #:  [id number]     Height:       65.0 in Accession #:    [id number]    Weight:       140.0 lb Date of Birth:  1945/10/13    BSA:          1.700 m Patient Age:    38 years      BP:  144/86 mmHg Patient Gender: F             HR:           112 bpm. Exam Location:  [facility] Procedure: 2D Echo Indications:     Hypertrophic Obstructive Cardiomyopathy  History:         Patient has no prior history of Echocardiogram examinations.                  Arrythmias:Atrial Fibrillation.  Sonographer:     Carlean Jews Thornton-Maynard Referring Phys:  [id number] [address] Pita Diagnosing Phys: Bartholome Bill MD IMPRESSIONS  1. Left ventricular ejection fraction, by estimation, is 65 to 70%. The left ventricle has normal function. The left ventricle has no regional wall motion abnormalities. There is moderate asymmetric left ventricular hypertrophy of the basal-septal segment. Left ventricular diastolic parameters were normal.  2. Right ventricular systolic function is normal. The right ventricular size is normal. There is normal pulmonary artery systolic pressure.  3. Left atrial size was mildly dilated.  4. Right atrial size was mildly dilated.  5. A small pericardial effusion is present. The pericardial effusion is posterior to the left ventricle and the left atrium. There is no evidence of cardiac tamponade.  6. The mitral valve is grossly normal. Moderate mitral valve regurgitation.  7. The aortic valve was not well visualized. Aortic valve regurgitation is trivial. FINDINGS  Left Ventricle: Left ventricular ejection fraction, by estimation, is 65 to 70%. The left ventricle has normal function. The left ventricle has no regional wall motion  abnormalities. The left ventricular internal cavity size was normal in size. There is  moderate asymmetric left ventricular hypertrophy of the basal-septal segment. Left ventricular diastolic parameters were normal. Right Ventricle: The right ventricular size is normal. No increase in right ventricular wall thickness. Right ventricular systolic function is normal. There is normal pulmonary artery systolic pressure. The tricuspid regurgitant velocity is 2.38 m/s, and  with an assumed right atrial pressure of 3 mmHg, the estimated right ventricular systolic pressure is 33.8 mmHg. Left Atrium: Left atrial size was mildly dilated. Right Atrium: Right atrial size was mildly dilated. Pericardium: A small pericardial effusion is present. The pericardial effusion is posterior to the left ventricle and the left atrium. There is no evidence of cardiac tamponade. Mitral Valve: The mitral valve is grossly normal. Moderate mitral valve regurgitation. MV peak gradient, 6.5 mmHg. The mean mitral valve gradient is 3.0 mmHg. Tricuspid Valve: The tricuspid valve is grossly normal. Tricuspid valve regurgitation is mild. Aortic Valve: The aortic valve was not well visualized. Aortic valve regurgitation is trivial. Aortic valve mean gradient measures 4.0 mmHg. Aortic valve peak gradient measures 6.6 mmHg. Aortic valve area, by VTI measures 2.50 cm. Pulmonic Valve: The pulmonic valve was not well visualized. Pulmonic valve regurgitation is trivial. Aorta: The aortic root is normal in size and structure. IAS/Shunts: The atrial septum is grossly normal. Additional Comments: A device lead is visualized.  LEFT VENTRICLE PLAX 2D LVIDd:         2.29 cm  Diastology LVIDs:         1.54 cm  LV e' medial:    5.33 cm/s LV PW:         1.84 cm  LV E/e' medial:  22.6 LV IVS:        2.19 cm  LV e' lateral:   6.74 cm/s LVOT diam:     2.00 cm  LV E/e' lateral: 17.9 LV SV:  52 LV SV Index:   30 LVOT Area:     3.14 cm  RIGHT VENTRICLE RV S prime:      8.59 cm/s LEFT ATRIUM             Index       RIGHT ATRIUM           Index LA diam:        4.30 cm 2.53 cm/m  RA Area:     20.10 cm LA Vol (A2C):   80.4 ml 47.30 ml/m RA Volume:   54.80 ml  32.24 ml/m LA Vol (A4C):   97.8 ml 57.53 ml/m LA Biplane Vol: 88.8 ml 52.24 ml/m  AORTIC VALVE                   PULMONIC VALVE AV Area (Vmax):    2.82 cm    PV Vmax:       1.14 m/s AV Area (Vmean):   2.67 cm    PV Peak grad:  5.2 mmHg AV Area (VTI):     2.50 cm AV Vmax:           128.00 cm/s AV Vmean:          89.700 cm/s AV VTI:            0.207 m AV Peak Grad:      6.6 mmHg AV Mean Grad:      4.0 mmHg LVOT Vmax:         115.00 cm/s LVOT Vmean:        76.200 cm/s LVOT VTI:          0.165 m LVOT/AV VTI ratio: 0.80  AORTA Ao Root diam: 2.80 cm MITRAL VALVE                 TRICUSPID VALVE MV Area (PHT): 2.86 cm      TR Peak grad:   22.7 mmHg MV Area VTI:   2.73 cm      TR Vmax:        238.00 cm/s MV Peak grad:  6.5 mmHg MV Mean grad:  3.0 mmHg      SHUNTS MV Vmax:       1.27 m/s      Systemic VTI:  0.16 m MV Vmean:      86.2 cm/s     Systemic Diam: 2.00 cm MR Peak grad:    69.2 mmHg MR Mean grad:    41.0 mmHg MR Vmax:         416.00 cm/s MR Vmean:        298.0 cm/s MR PISA:         3.08 cm MR PISA Eff ROA: 51 mm MR PISA Radius:  0.70 cm MV E velocity: 120.33 cm/s Bartholome Bill MD Electronically signed by Bartholome Bill MD Signature Date/Time: 04/08/2021/3:42:55 PM    Final      Medications:     prismasol BGK 4/2.5 500 mL/hr at 04/08/21 1713    prismasol BGK 4/2.5 500 mL/hr at 04/08/21 1713   amiodarone 30 mg/hr (04/09/21 0948)   anidulafungin Stopped (04/08/21 1610)   fentaNYL infusion INTRAVENOUS 200 mcg/hr (04/09/21 1000)   norepinephrine (LEVOPHED) Adult infusion 5 mcg/min (04/09/21 1000)   piperacillin-tazobactam Stopped (04/09/21 0824)   prismasol BGK 4/2.5 2,000 mL/hr at 04/09/21 0806   propofol (DIPRIVAN) infusion 40 mcg/kg/min (04/09/21 1000)   TPN ADULT (ION) 40 mL/hr at 04/09/21 1000    vasopressin 0.04 Units/min (04/09/21 0950)  sodium chloride   Intravenous Once   albuterol  2.5 mg Nebulization Q6H   chlorhexidine gluconate (MEDLINE KIT)  15 mL Mouth Rinse BID   Chlorhexidine Gluconate Cloth  6 each Topical Q0600   insulin aspart  0-9 Units Subcutaneous Q4H   mouth rinse  15 mL Mouth Rinse 10 times per day   pantoprazole (PROTONIX) IV  40 mg Intravenous Daily   heparin sodium (porcine), midazolam, polyvinyl alcohol  Assessment/ Plan:  75 y.o. female with a PMHx of hypertrophic cardiomyopathy, history of AICD placement, history of ventricular tachycardia, atrial fibrillation, history of diverticulitis, who was admitted to [facility] on 04/26/2021 for evaluation of diffuse abdominal pain.  She was found to have bowel perforation with significant pneumoperitoneum.   1.  Acute kidney injury secondary to ATN from septic shock. 2.  Metabolic acidosis. 3.  Acute respiratory failure. 4.  Hyponatremia. 5.  Bowel perforation status post exploratory laparotomy. 6.  Septic shock.  Plan: We plan to maintain the patient on CRRT at this time.  She continues to have septic shock requiring multiple pressors.  Patient remains oliguric as well.  Azotemia has improved in part.  Potassium acceptable at 3.8.  Serum bicarbonate improved to 27.  Overall continues to have guarded prognosis however metabolic parameters have improved a bit with CRRT.  We will continue to monitor.   LOS: 2 Lindsay [name] 7/5/202210:27 AM

## 2021-04-09 NOTE — Progress Notes (Signed)
Lindsay [name]: Lindsay [name] Date of Encounter: 04/09/2021  Hospital Problem List     Active Problems:   Atrial fibrillation with rapid ventricular response (HCC)   Bowel perforation (HCC)   Peritonitis (HCC)   Severe sepsis (HCC)   Acute renal failure (ARF) (HCC)   Lactic acidosis   Volume depletion   Coagulopathy (HCC)   Acute respiratory failure (HCC)   On mechanically assisted ventilation (HCC)   Acute blood loss anemia    Lindsay [name] Profile      75 y.o. who [name] a history of hypertrophic nonobstructive nonfamilial cardiomyopathy, mild to moderate MR diagnosed in 2005.  Lindsay [name] primary prevention ICD entheses [employer] device.)  For NSVT and [name] had inappropriate shocks for atrial tachycardia in the past now status post ablation 2.   Prior use of Sotalol and dofetilide for rhythm control. Underwent repeat ablation here in October 2021 and no AAD afterwards.  CHADSVASC score 4 and on Xarelto.  Lindsay [name] is now admitted and in ICU intubated status post pneumoperitoneum and bowel perforation.  Lindsay [name] most recent echocardiogram was done and December 2021 showing ejection fraction of greater than 60% with asymmetric septal hypertrophy with maximal wall thickness of 18 mm.  There is no systolic anterior mitral valve motion.  Lindsay [name] moderate mitral valve regurgitation.  Lindsay [name] AICD is for primary prevention followed by [facility].  Lindsay [name] echo in December of last year also showed a very small pericardial effusion with no tamponade physiology.  Lindsay [name] currently is being treated as an outpatient with Xarelto 20 mg daily, rosuvastatin 10 mg daily, metoprolol succinate 100 mg twice daily, losartan 50 mg twice daily.  He [name] a reported amiodarone allergy, review of Lindsay [name] chart from [facility] suggest that it was an intolerance due to nausea, diarrhea, weight loss and jitteriness.  Lindsay [name] most recent ICD interrogation showed adequate function with several episodes of ATR.  Lindsay [name] was being evaluated for probable Crohn's disease  and/or diverticulitis.  7/5  Echo revealed similar findings to previous echo done at [facility]. EF 60-65 with asymmetric septal hypertrophy with moderate MR. No obvious SAM. Rhythm appears to be sinus tach/vs atrial tach. Remains on iv amiodarone. On CRT. Requiring norepinephrine and vasopressin. Intubated and sedated.   Subjective   Intubated and sedated  Inpatient Medications     sodium chloride   Intravenous Once   albuterol  2.5 mg Nebulization Q6H   chlorhexidine gluconate (MEDLINE KIT)  15 mL Mouth Rinse BID   Chlorhexidine Gluconate Cloth  6 each Topical Q0600   insulin aspart  0-9 Units Subcutaneous Q4H   mouth rinse  15 mL Mouth Rinse 10 times per day   pantoprazole (PROTONIX) IV  40 mg Intravenous Daily    Vital Signs    Vitals:   04/09/21 0645 04/09/21 0650 04/09/21 0700 04/09/21 0743  BP:      Pulse: (!) 108 (!) 127 (!) 117   Resp:      Temp: (!) 95.5 F (35.3 C) (!) 95.5 F (35.3 C) (!) 95.5 F (35.3 C)   TempSrc:      SpO2: 95% 95% 95% 96%  Weight:      Height:        Intake/Output Summary (Last 24 hours) at 04/09/2021 0758 Last data filed at 04/09/2021 0700 Gross per 24 hour  Intake 2893.49 ml  Output 2091 ml  Net 802.49 ml   Filed Weights   04/16/2021 0310  Weight: 63.5 kg    Physical Exam  GEN: Acutely ill female on vent.  HEENT: normal.  Neck: Supple, no JVD, carotid bruits, or masses. Cardiac: tachycardia Respiratory:  Distant breath sounds GS: s/p exploratory lap MS: no deformity or atrophy. Skin: warm and dry, no rash. Neuro:  Intubated and sedated.   Labs    CBC Recent Labs    04/27/2021 1256 05/03/2021 1651 04/08/21 0307 04/09/21 0521  WBC 14.9*  --  21.3* 19.3*  NEUTROABS 14.5*  --   --  18.5*  HGB 6.7*   < > 9.0* 8.3*  HCT 21.3*   < > 26.5* 24.2*  MCV 79.8*  --  81.8 82.9  PLT 266  --  167 131*   < > = values in this interval not displayed.   Basic Metabolic Panel Recent Labs    04/08/21 0307 04/08/21 1650 04/09/21 0521   NA 132* 135 137  135  K 3.8 3.9 3.9  3.8  CL 101 102 103  102  CO2 19* 16* 27  27  GLUCOSE 233* 204* 233*  238*  BUN 71* 79* 40*  39*  CREATININE 4.24* 4.90* 2.18*  2.33*  CALCIUM 7.2* 7.1* 7.3*  7.2*  MG 1.9  --  2.3  PHOS 7.4* 7.6* 3.3  3.3   Liver Function Tests Recent Labs    04/19/2021 1256 04/08/21 0307 04/08/21 1650 04/09/21 0521  AST 42*  --   --  38  ALT 21  --   --  31  ALKPHOS 28*  --   --  44  BILITOT 1.4*  --   --  1.3*  PROT 3.8*  --   --  4.3*  ALBUMIN 2.3*   < > 2.2* 2.0*  2.0*   < > = values in this interval not displayed.   Recent Labs    04/09/2021 0348  LIPASE 41   Cardiac Enzymes No results for input(s): CKTOTAL, CKMB, CKMBINDEX, TROPONINI in the last 72 hours. BNP No results for input(s): BNP in the last 72 hours. D-Dimer No results for input(s): DDIMER in the last 72 hours. Hemoglobin A1C Recent Labs    04/29/2021 1651  HGBA1C 5.7*   Fasting Lipid Panel Recent Labs    04/09/21 0521  TRIG 224*   Thyroid Function Tests No results for input(s): TSH, T4TOTAL, T3FREE, THYROIDAB in the last 72 hours.  Invalid input(s): FREET3  Telemetry    Probable atrial tach although may be afib vs sinus tach  ECG       Radiology    CT ABDOMEN PELVIS WO CONTRAST  Result Date: 04/25/2021 CLINICAL DATA:  Nonlocalized acute abdominal pain. Nausea and weakness. Decreased urination over the past 4 days EXAM: CT ABDOMEN AND PELVIS WITHOUT CONTRAST TECHNIQUE: Multidetector CT imaging of the abdomen and pelvis was performed following the standard protocol without IV contrast. COMPARISON:  None. FINDINGS: Lower chest: No acute abnormality. Hepatobiliary: Several similar-appearing fluid density lesions are noted within the liver with the largest measuring up to 2.3 cm. Findings likely represent hepatic cysts. Subcentimeter hypodensities are too small to characterize. No new focal lesion. No gallstones, gallbladder wall thickening, or pericholecystic  fluid. No biliary dilatation. Pancreas: No focal lesion. Normal pancreatic contour. No surrounding inflammatory changes. No main pancreatic ductal dilatation. Spleen: Normal in size without focal abnormality. Adrenals/Urinary Tract: No adrenal nodule bilaterally. No nephrolithiasis, no hydronephrosis, and no contour-deforming renal mass. No ureterolithiasis or hydroureter. The urinary bladder is decompressed with a urinary Foley catheter terminating within the lumen. Several foci of gas are  noted within the urinary bladder lumen as well as within the Foley catheter. Stomach/Bowel: Stomach is within normal limits. Irregular bowel wall and haziness throughout the abdomen pelvis. Question scattered colonic diverticula of the skin colon. No definite bowel dilatation. The appendix not definitely identified. Vascular/Lymphatic: No abdominal aorta or iliac aneurysm. Severe atherosclerotic plaque of the aorta and its branches. No abdominal, pelvic, or inguinal lymphadenopathy. Reproductive: Uterus and bilateral adnexa are unremarkable. Other: Large volume pneumoperitoneum with at least small volume free fluid mesenteric fat stranding throughout the abdomen and pelvis. Musculoskeletal: No abdominal wall hernia or abnormality. No suspicious lytic or blastic osseous lesions. No acute displaced fracture. Multilevel degenerative changes of the spine. IMPRESSION: 1. Bowel perforation with marked inflammatory changes and pneumoperitoneum throughout the abdomen and pelvis. Bowel ischemia not excluded. Markedly limited evaluation on this noncontrast study. Recommend emergent surgical consultation. 2. Multiple foci of gas noted within the urinary bladder lumen and Foley catheter tubing. Correlate with urinalysis to exclude infection. These results were called by telephone at the time of interpretation on 05/01/2021 at 5:19 am to provider [facility] , who verbally acknowledged these results. Electronically Signed   By: Iven Finn M.D.   On: 04/22/2021 05:28   DG Chest Port 1 View  Result Date: 04/08/2021 CLINICAL DATA:  Central line placement. EXAM: PORTABLE CHEST 1 VIEW COMPARISON:  Radiograph earlier today. FINDINGS: Large-bore left-sided internal jugular catheter is low in positioning at the atrial IVC junction, at or possibly below the diaphragm. No pneumothorax. Right internal central line, endotracheal tube, enteric tube, left-sided pacemaker remain in place. Stable heart size and mediastinal contours. Mild hazy left lung base opacity likely small pleural effusion and atelectasis. Presumed surgical drain in the left upper quadrant. No pulmonary edema. IMPRESSION: 1. Left internal jugular central line with tip low in positioning at the atrial IVC junction, either at or possibly below the diaphragm. No pneumothorax. 2. Remaining support apparatus are stable. 3. Hazy left lung base opacity likely small pleural effusion and atelectasis. Electronically Signed   By: Keith Rake M.D.   On: 04/08/2021 16:54   Portable Chest xray  Result Date: 04/08/2021 CLINICAL DATA:  Respiratory failure. History of diabetes and hypertension. Follow-up exam. EXAM: PORTABLE CHEST 1 VIEW COMPARISON:  04/21/2021. FINDINGS: Cardiac silhouette normal in size.  No mediastinal or hilar masses. Stable prominent bronchovascular markings bilaterally. No convincing pneumonia or pulmonary edema. Endotracheal tube, nasal/orogastric tube, right internal jugular central venous catheter and left anterior chest wall AICD are all stable. IMPRESSION: 1. No acute cardiopulmonary disease. 2. Support apparatus is stable and well positioned. Electronically Signed   By: Lajean Manes M.D.   On: 04/08/2021 07:46   DG Chest Port 1 View  Result Date: 04/26/2021 CLINICAL DATA:  Respiratory failure. EXAM: PORTABLE CHEST 1 VIEW COMPARISON:  May 30, 2020. FINDINGS: The heart size and mediastinal contours are within normal limits. Both lungs are clear. Endotracheal  and nasogastric tubes are in good position. Left-sided pacemaker is unchanged in position. Right internal jugular catheter is in good position. The visualized skeletal structures are unremarkable. IMPRESSION: No active disease. Aortic Atherosclerosis (ICD10-I70.0). Electronically Signed   By: Marijo Conception M.D.   On: 04/26/2021 13:48   ECHOCARDIOGRAM COMPLETE  Result Date: 04/08/2021    ECHOCARDIOGRAM REPORT   Lindsay [name]:   ANTONIO WOODHAMS Date of Exam: 04/08/2021 Medical Rec #:  [id number]     Height:       65.0 in Accession #:    [id number]  Weight:       140.0 lb Date of Birth:  1946-05-04    BSA:          1.700 m Lindsay [name] Age:    12 years      BP:           144/86 mmHg Lindsay [name] Gender: F             HR:           112 bpm. Exam Location:  [facility] Procedure: 2D Echo Indications:     Hypertrophic Obstructive Cardiomyopathy  History:         Lindsay [name] [name] no prior history of Echocardiogram examinations.                  Arrythmias:Atrial Fibrillation.  Sonographer:     Carlean Jews Thornton-Maynard Referring Phys:  [id number] Tyler Pita Diagnosing Phys: Bartholome Bill MD IMPRESSIONS  1. Left ventricular ejection fraction, by estimation, is 65 to 70%. The left ventricle [name] normal function. The left ventricle [name] no regional wall motion abnormalities. There is moderate asymmetric left ventricular hypertrophy of the basal-septal segment. Left ventricular diastolic parameters were normal.  2. Right ventricular systolic function is normal. The right ventricular size is normal. There is normal pulmonary artery systolic pressure.  3. Left atrial size was mildly dilated.  4. Right atrial size was mildly dilated.  5. A small pericardial effusion is present. The pericardial effusion is posterior to the left ventricle and the left atrium. There is no evidence of cardiac tamponade.  6. The mitral valve is grossly normal. Moderate mitral valve regurgitation.  7. The aortic valve was not well visualized. Aortic valve regurgitation is trivial.  FINDINGS  Left Ventricle: Left ventricular ejection fraction, by estimation, is 65 to 70%. The left ventricle [name] normal function. The left ventricle [name] no regional wall motion abnormalities. The left ventricular internal cavity size was normal in size. There is  moderate asymmetric left ventricular hypertrophy of the basal-septal segment. Left ventricular diastolic parameters were normal. Right Ventricle: The right ventricular size is normal. No increase in right ventricular wall thickness. Right ventricular systolic function is normal. There is normal pulmonary artery systolic pressure. The tricuspid regurgitant velocity is 2.38 m/s, and  with an assumed right atrial pressure of 3 mmHg, the estimated right ventricular systolic pressure is 90.3 mmHg. Left Atrium: Left atrial size was mildly dilated. Right Atrium: Right atrial size was mildly dilated. Pericardium: A small pericardial effusion is present. The pericardial effusion is posterior to the left ventricle and the left atrium. There is no evidence of cardiac tamponade. Mitral Valve: The mitral valve is grossly normal. Moderate mitral valve regurgitation. MV peak gradient, 6.5 mmHg. The mean mitral valve gradient is 3.0 mmHg. Tricuspid Valve: The tricuspid valve is grossly normal. Tricuspid valve regurgitation is mild. Aortic Valve: The aortic valve was not well visualized. Aortic valve regurgitation is trivial. Aortic valve mean gradient measures 4.0 mmHg. Aortic valve peak gradient measures 6.6 mmHg. Aortic valve area, by VTI measures 2.50 cm. Pulmonic Valve: The pulmonic valve was not well visualized. Pulmonic valve regurgitation is trivial. Aorta: The aortic root is normal in size and structure. IAS/Shunts: The atrial septum is grossly normal. Additional Comments: A device lead is visualized.  LEFT VENTRICLE PLAX 2D LVIDd:         2.29 cm  Diastology LVIDs:         1.54 cm  LV e' medial:    5.33 cm/s LV PW:  1.84 cm  LV E/e' medial:  22.6 LV IVS:         2.19 cm  LV e' lateral:   6.74 cm/s LVOT diam:     2.00 cm  LV E/e' lateral: 17.9 LV SV:         52 LV SV Index:   30 LVOT Area:     3.14 cm  RIGHT VENTRICLE RV S prime:     8.59 cm/s LEFT ATRIUM             Index       RIGHT ATRIUM           Index LA diam:        4.30 cm 2.53 cm/m  RA Area:     20.10 cm LA Vol (A2C):   80.4 ml 47.30 ml/m RA Volume:   54.80 ml  32.24 ml/m LA Vol (A4C):   97.8 ml 57.53 ml/m LA Biplane Vol: 88.8 ml 52.24 ml/m  AORTIC VALVE                   PULMONIC VALVE AV Area (Vmax):    2.82 cm    PV Vmax:       1.14 m/s AV Area (Vmean):   2.67 cm    PV Peak grad:  5.2 mmHg AV Area (VTI):     2.50 cm AV Vmax:           128.00 cm/s AV Vmean:          89.700 cm/s AV VTI:            0.207 m AV Peak Grad:      6.6 mmHg AV Mean Grad:      4.0 mmHg LVOT Vmax:         115.00 cm/s LVOT Vmean:        76.200 cm/s LVOT VTI:          0.165 m LVOT/AV VTI ratio: 0.80  AORTA Ao Root diam: 2.80 cm MITRAL VALVE                 TRICUSPID VALVE MV Area (PHT): 2.86 cm      TR Peak grad:   22.7 mmHg MV Area VTI:   2.73 cm      TR Vmax:        238.00 cm/s MV Peak grad:  6.5 mmHg MV Mean grad:  3.0 mmHg      SHUNTS MV Vmax:       1.27 m/s      Systemic VTI:  0.16 m MV Vmean:      86.2 cm/s     Systemic Diam: 2.00 cm MR Peak grad:    69.2 mmHg MR Mean grad:    41.0 mmHg MR Vmax:         416.00 cm/s MR Vmean:        298.0 cm/s MR PISA:         3.08 cm MR PISA Eff ROA: 51 mm MR PISA Radius:  0.70 cm MV E velocity: 120.33 cm/s Bartholome Bill MD Electronically signed by Bartholome Bill MD Signature Date/Time: 04/08/2021/3:42:55 PM    Final     Assessment & Plan    75 year old female with history of hypertrophic obstructive nonsegmental cardiomyopathy was preserved LV function diagnosed in 2005, history of AICD in place for primary prevention status post A. fib ablation x2 admitted with an acute abdomen with perforation and free air.  Status post surgical  intervention.  Currently with a narrow complex  tachycardia consistent with atrial tachycardia versus atrial fib with RVR.  Rate between 101 120.  1.  Atrial fibrillation-appears to be in an atrial tachycardia.  [name] had this as an outpatient.  [name] been on a variety of antiarrhythmic medications.  Is status post ablation x2.  Chart reports an intolerance to amiodarone but Lindsay [name] is currently on an amiodarone drip.  The intolerance apparently was agitation and gastric distress.  We will continue with this for now to attempt rate control.    [name] had a history of inappropriate treatments due to atrial tachycardia.  Currently will attempt to keep rate slowed to prevent this.  Was on Xarelto as an outpatient.  Will defer this for now status post acute abdomen.  2.  Hypertrophic obstructive cardiomyopathy-being followed at [facility].  Echo done today reveals similar findings to echo done 1 year ago.  EF 60 to 70% with asymmetric septal hypertrophy.  There is no evidence of Sam.  There is mild to moderate MR which [name] been present previously.    3.  Acute renal insufficiency likely secondary to acute abdomen.  ON CRRT  4.  Acute abdomen-status post bowel perforation status postsurgical resection with partial colectomy.  [name] Jackson-Pratt drains and wound VAC in place.  Continue tube feed followed by general surgery.   Signed, Javier Docker Amita Atayde MD 04/09/2021, 7:58 AM  Pager: [phone]

## 2021-04-10 DIAGNOSIS — N179 Acute kidney failure, unspecified: Secondary | ICD-10-CM | POA: Diagnosis not present

## 2021-04-10 DIAGNOSIS — K631 Perforation of intestine (nontraumatic): Secondary | ICD-10-CM | POA: Diagnosis not present

## 2021-04-10 DIAGNOSIS — J9601 Acute respiratory failure with hypoxia: Secondary | ICD-10-CM | POA: Diagnosis not present

## 2021-04-10 LAB — CBC WITH DIFFERENTIAL/PLATELET
Abs Immature Granulocytes: 0.85 10*3/uL — ABNORMAL HIGH (ref 0.00–0.07)
Basophils Absolute: 0 10*3/uL (ref 0.0–0.1)
Basophils Relative: 0 %
Eosinophils Absolute: 0 10*3/uL (ref 0.0–0.5)
Eosinophils Relative: 0 %
HCT: 23.8 % — ABNORMAL LOW (ref 36.0–46.0)
Hemoglobin: 8 g/dL — ABNORMAL LOW (ref 12.0–15.0)
Immature Granulocytes: 4 %
Lymphocytes Relative: 1 %
Lymphs Abs: 0.2 10*3/uL — ABNORMAL LOW (ref 0.7–4.0)
MCH: 28 pg (ref 26.0–34.0)
MCHC: 33.6 g/dL (ref 30.0–36.0)
MCV: 83.2 fL (ref 80.0–100.0)
Monocytes Absolute: 0.2 10*3/uL (ref 0.1–1.0)
Monocytes Relative: 1 %
Neutro Abs: 20.4 10*3/uL — ABNORMAL HIGH (ref 1.7–7.7)
Neutrophils Relative %: 94 %
Platelets: 120 10*3/uL — ABNORMAL LOW (ref 150–400)
RBC: 2.86 MIL/uL — ABNORMAL LOW (ref 3.87–5.11)
RDW: 18.2 % — ABNORMAL HIGH (ref 11.5–15.5)
WBC: 21.8 10*3/uL — ABNORMAL HIGH (ref 4.0–10.5)
nRBC: 0 % (ref 0.0–0.2)

## 2021-04-10 LAB — RENAL FUNCTION PANEL
Albumin: 1.8 g/dL — ABNORMAL LOW (ref 3.5–5.0)
Albumin: 1.7 g/dL — ABNORMAL LOW (ref 3.5–5.0)
Anion gap: 3 — ABNORMAL LOW (ref 5–15)
Anion gap: 5 (ref 5–15)
BUN: 20 mg/dL (ref 8–23)
BUN: 18 mg/dL (ref 8–23)
CO2: 27 mmol/L (ref 22–32)
CO2: 26 mmol/L (ref 22–32)
Calcium: 7.3 mg/dL — ABNORMAL LOW (ref 8.9–10.3)
Calcium: 7.2 mg/dL — ABNORMAL LOW (ref 8.9–10.3)
Chloride: 104 mmol/L (ref 98–111)
Chloride: 104 mmol/L (ref 98–111)
Creatinine, Ser: 1.09 mg/dL — ABNORMAL HIGH (ref 0.44–1.00)
Creatinine, Ser: 0.93 mg/dL (ref 0.44–1.00)
GFR, Estimated: 53 mL/min — ABNORMAL LOW (ref >60)
GFR, Estimated: >60 mL/min (ref >60)
Glucose, Bld: 237 mg/dL — ABNORMAL HIGH (ref 70–99)
Glucose, Bld: 266 mg/dL — ABNORMAL HIGH (ref 70–99)
Phosphorus: 1.6 mg/dL — ABNORMAL LOW (ref 2.5–4.6)
Phosphorus: 2.7 mg/dL (ref 2.5–4.6)
Potassium: 3.6 mmol/L (ref 3.5–5.1)
Potassium: 3.4 mmol/L — ABNORMAL LOW (ref 3.5–5.1)
Sodium: 134 mmol/L — ABNORMAL LOW (ref 135–145)
Sodium: 135 mmol/L (ref 135–145)

## 2021-04-10 LAB — GLUCOSE, CAPILLARY
Glucose-Capillary: 211 mg/dL — ABNORMAL HIGH (ref 70–99)
Glucose-Capillary: 194 mg/dL — ABNORMAL HIGH (ref 70–99)
Glucose-Capillary: 209 mg/dL — ABNORMAL HIGH (ref 70–99)
Glucose-Capillary: 231 mg/dL — ABNORMAL HIGH (ref 70–99)
Glucose-Capillary: 203 mg/dL — ABNORMAL HIGH (ref 70–99)
Glucose-Capillary: 140 mg/dL — ABNORMAL HIGH (ref 70–99)

## 2021-04-10 LAB — MAGNESIUM: Magnesium: 2.3 mg/dL (ref 1.7–2.4)

## 2021-04-10 MED ORDER — NOREPINEPHRINE 4 MG/250ML-% IV SOLN
INTRAVENOUS | Status: AC
  Administered 2021-04-10: 2.5 ug/min via INTRAVENOUS
  Filled 2021-04-10: qty 250

## 2021-04-10 MED ORDER — AMIODARONE LOAD VIA INFUSION
150.0000 mg | Freq: Once | INTRAVENOUS | Status: AC
  Administered 2021-04-11: 150 mg via INTRAVENOUS
  Filled 2021-04-10: qty 83.34

## 2021-04-10 MED ORDER — CHLORHEXIDINE GLUCONATE CLOTH 2 % EX PADS
6.0000 | MEDICATED_PAD | Freq: Every day | CUTANEOUS | Status: DC
  Administered 2021-04-10 – 2021-04-20 (×11): 6 via TOPICAL

## 2021-04-10 MED ORDER — SODIUM PHOSPHATES 45 MMOLE/15ML IV SOLN
30.0000 mmol | Freq: Once | INTRAVENOUS | Status: AC
  Administered 2021-04-10: 30 mmol via INTRAVENOUS
  Filled 2021-04-10: qty 10

## 2021-04-10 MED ORDER — INSULIN ASPART 100 UNIT/ML IJ SOLN
2.0000 [IU] | INTRAMUSCULAR | Status: DC
  Administered 2021-04-10 – 2021-04-14 (×22): 2 [IU] via SUBCUTANEOUS
  Filled 2021-04-10 (×22): qty 1

## 2021-04-10 MED ORDER — TRAVASOL 10 % IV SOLN
INTRAVENOUS | Status: AC
  Filled 2021-04-10: qty 1056

## 2021-04-10 MED ORDER — NOREPINEPHRINE 4 MG/250ML-% IV SOLN
0.0000 ug/min | INTRAVENOUS | Status: DC
  Administered 2021-04-10: 12 ug/min via INTRAVENOUS
  Administered 2021-04-11: 11 ug/min via INTRAVENOUS
  Administered 2021-04-11: 13 ug/min via INTRAVENOUS
  Administered 2021-04-11: 11 ug/min via INTRAVENOUS
  Administered 2021-04-11: 19 ug/min via INTRAVENOUS
  Administered 2021-04-12: 13 ug/min via INTRAVENOUS
  Administered 2021-04-12: 25 ug/min via INTRAVENOUS
  Administered 2021-04-12: 8 ug/min via INTRAVENOUS
  Filled 2021-04-10 (×8): qty 250

## 2021-04-10 MED ORDER — AMIODARONE IV BOLUS ONLY 150 MG/100ML
INTRAVENOUS | Status: AC
  Filled 2021-04-10: qty 100

## 2021-04-10 MED ORDER — INSULIN ASPART 100 UNIT/ML IJ SOLN
0.0000 [IU] | INTRAMUSCULAR | Status: DC
  Administered 2021-04-10: 7 [IU] via SUBCUTANEOUS
  Administered 2021-04-10: 3 [IU] via SUBCUTANEOUS
  Administered 2021-04-10 – 2021-04-11 (×3): 7 [IU] via SUBCUTANEOUS
  Administered 2021-04-11: 4 [IU] via SUBCUTANEOUS
  Administered 2021-04-11: 3 [IU] via SUBCUTANEOUS
  Administered 2021-04-12: 4 [IU] via SUBCUTANEOUS
  Administered 2021-04-12: 3 [IU] via SUBCUTANEOUS
  Administered 2021-04-12: 4 [IU] via SUBCUTANEOUS
  Administered 2021-04-12 (×2): 7 [IU] via SUBCUTANEOUS
  Administered 2021-04-13: 3 [IU] via SUBCUTANEOUS
  Administered 2021-04-13 – 2021-04-15 (×15): 4 [IU] via SUBCUTANEOUS
  Administered 2021-04-16 (×3): 3 [IU] via SUBCUTANEOUS
  Filled 2021-04-10 (×31): qty 1

## 2021-04-10 NOTE — Progress Notes (Addendum)
GOALS OF CARE DISCUSSION  The Clinical status was relayed to family in detail. Husband at bedside  Updated and notified of patients medical condition.    Patient remains unresponsive and will not open eyes to command.   Patient is having a weak cough and struggling to remove secretions.   Patient with increased WOB and using accessory muscles to breathe Explained to family course of therapy and the modalities  Severe progressive multiorgan failure On TPN Severe shock and tachycardia On CRRT, on vent    Patient with Progressive multiorgan failure with a very high probablity of a very minimal chance of meaningful recovery despite all aggressive and optimal medical therapy.  PATIENT REMAINS DNR status  Family understands the situation.   Family are satisfied with Plan of action and management. All questions answered  Additional CC time 22 mins   Chrisotpher Rivero Patricia Pesa, M.D.  [facility]  Medical Director [place] Director [facility] Cardio-Pulmonary Department

## 2021-04-10 NOTE — Progress Notes (Signed)
Patient spontaneously aroused with Propofol at 89mcg. Obvious tremor noted. HR up to the 120s and BP 867E systolic. Pt nodded appropriately and attempted hand grip on command. Denies pain. Propofol increased for RASS of -2. Will continue to monitor.

## 2021-04-10 NOTE — Progress Notes (Addendum)
PHARMACY - TOTAL PARENTERAL NUTRITION CONSULT NOTE   Indication:  extensive bowel surgery, poor PO intake PTA  Patient Measurements: Height: 5\' 5"  (165.1 cm) Weight: 78.6 kg (173 lb 4.5 oz) (SCD pump removed, wound vac remained) IBW/kg (Calculated) : 57 TPN AdjBW (KG): 63.5 Body mass index is 28.84 kg/m.  Assessment: 75 year old female with minimal PO intake 5 days prior to admission. Imaging showed bowel perforation; patient was taken to the OR for exploratory laparotomy with low anterior resection and colostomy placement. Patient is intubated and sedated in the ICU. Required vasopressors, which have been titrated down. Episode of atrial fibrillation with RVR 7/4, started on amiodarone infusion. Patient has h/o afib s/p ablation on Xarelto; unable to anticoagulate at this time s/t recent surgery. AKI with decreased UOP prior to hospitalization; started on CRRT 7/4.  Glucose / Insulin: on sensitive SSI 24 hr glucose range 177-234 24 hr insulin requirements 25 Electrolytes: hyperphosphatemia Renal: Scr 4.81 (decreased UOP pta), started on CRRT 7/4 Intake / Output: net (+) 11 L GI Imaging: 7/3 CT abdomen/pelvis: Bowel perforation with marked inflammatory changes and pneumoperitoneum throughout the abdomen and pelvis. Bowel ischemia not excluded. GI Surgeries / Procedures:  7/3 ex lap with low anterior resection and colostomy placement  Central access: CVC right IJ TPN start date: 7/4  Nutritional Goals (per RD recommendation on 7/4): kCal: 1800-2100, Protein: 95-120 g, Fluid: 1.8-2 L/day Goal TPN rate is 80 mL/hr (provides 105 g of protein and 1689 kcals per day)  **Patient receiving an additional 228 kcal from propofol at current rate 25 mcg/kg/min**  Current Nutrition: NPO  Plan:  Continue TPN at goal rate 80 mL/hr  Plan to stop propofol 7/5 however patient intolerant. Started back on propofol, currently at 25 mcg/kg/min. TPN adjusted to accommodate for kcal provided by  propofol.  Electrolytes in TPN: Na 50 mEq/L, K 0 mEq/L, Ca 5 mEq/L, Mg 5 mEq/L, and Phos 15 mmol/L. Cl:Ac 1:1 Patient started on CRRT 7/4. Plan to remain on CRRT per Nephrology. Remove potassium from TPN while patient remains on CRRT as potassium will be managed via CRRT fluid. Phosphorus continues to decrease in setting of CRRT. Order for Na phos 30 mmol IV this morning as well as increase in phosphorus provided by TPN.  Add standard MVI and trace elements to TPN. Remove chromium for decreased renal function.  Increase insulin to resistant SSI. Add Novolog 2 units standing coverage.  Monitor TPN labs on Mon/Thurs at minimum. Currently ordered BID while patient is on CRRT  Tawnya Crook, PharmD 04/10/2021,10:26 AM

## 2021-04-10 NOTE — Progress Notes (Signed)
Patient Name: Lindsay [name] Date of Encounter: 04/10/2021  Hospital Problem List     Active Problems:   Atrial fibrillation with rapid ventricular response (HCC)   Bowel perforation (HCC)   Peritonitis (HCC)   Severe sepsis (HCC)   Acute renal failure (ARF) (HCC)   Lactic acidosis   Volume depletion   Coagulopathy (HCC)   Acute respiratory failure (HCC)   On mechanically assisted ventilation (HCC)   Acute blood loss anemia    Patient Profile        75 y.o. who has a history of hypertrophic nonobstructive nonfamilial cardiomyopathy, mild to moderate MR diagnosed in 2005.  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   Prior use of Sotalol and dofetilide for rhythm control. Underwent repeat ablation here in October 2021 and no AAD afterwards.  CHADSVASC score 4 and on Xarelto.  She is now admitted and in ICU intubated status post pneumoperitoneum and bowel perforation.  Her most recent echocardiogram was done and December 2021 showing ejection fraction of greater than 60% with asymmetric septal hypertrophy with maximal wall thickness of 18 mm.  There is no systolic anterior mitral valve motion.  She has moderate mitral valve regurgitation.  Her AICD is for primary prevention followed by [facility].  Her echo in December of last year also showed a very small pericardial effusion with no tamponade physiology.  She currently is being treated as an outpatient with Xarelto 20 mg daily, rosuvastatin 10 mg daily, metoprolol succinate 100 mg twice daily, losartan 50 mg twice daily.  He has a reported amiodarone allergy, review of her chart from [place] suggest that it was an intolerance due to nausea, diarrhea, weight loss and jitteriness.  Her most recent ICD interrogation showed adequate function with several episodes of ATR.  She was being evaluated for probable Crohn's disease  and/or diverticulitis.   7/5  Echo revealed similar findings to previous echo done at [facility]. EF 60-65 with asymmetric septal hypertrophy with moderate MR. No obvious SAM. Rhythm appears to be sinus tach/vs atrial tach. Remains on iv amiodarone. On CRT. Requiring norepinephrine and vasopressin. Intubated and sedated.  7/6 still requiring pressors.  Intubated and sedated.  Appears to be in atrial fibrillation with controlled ventricular response.  Rate controlled on amiodarone   Subjective   Intubated and sedated  Inpatient Medications     albuterol  2.5 mg Nebulization Q6H   chlorhexidine gluconate (MEDLINE KIT)  15 mL Mouth Rinse BID   Chlorhexidine Gluconate Cloth  6 each Topical Q0600    HYDROmorphone (DILAUDID) injection  0.5 mg Intravenous Once   insulin aspart  0-20 Units Subcutaneous Q4H   insulin aspart  2 Units Subcutaneous Q4H   mouth rinse  15 mL Mouth Rinse 10 times per day   pantoprazole (PROTONIX) IV  40 mg Intravenous Daily    Vital Signs    Vitals:   04/10/21 1500 04/10/21 1519 04/10/21 1530 04/10/21 1600  BP: (!) 101/57  100/63 119/68  Pulse:   (!) 105   Resp: _0 Temp: (!) 93.2 F (34 C)  (!) 93.38 F (34.1 C) (!) 93.38 F (34.1 C)  TempSrc: Rectal   Rectal  SpO2: 98% 97% 100% 100%  Weight:      Height:        Intake/Output Summary (Last 24 hours) at 04/10/2021 1626 Last data  filed at 04/10/2021 1600 Gross per 24 hour  Intake 3736.37 ml  Output 3319 ml  Net 417.37 ml   Filed Weights   05/02/2021 0310 04/10/21 0333  Weight: 63.5 kg 78.6 kg    Physical Exam    Acutely ill-appearing female HEENT: normal.  Neck: Supple, no JVD, carotid bruits, or masses. Cardiac: Irregular regular Respiratory:  Respirations regular and unlabored, clear to auscultation bilaterally. GI: Soft, nontender, nondistended, BS + x 4. MS: no deformity or atrophy. Skin: warm and dry, no rash. Neuro: Intubated and sedated  Labs    CBC Recent Labs    04/09/21 0521  04/10/21 0312  WBC 19.3* 21.8*  NEUTROABS 18.5* 20.4*  HGB 8.3* 8.0*  HCT 24.2* 23.8*  MCV 82.9 83.2  PLT 131* 622*   Basic Metabolic Panel Recent Labs    04/09/21 0521 04/09/21 1601 04/10/21 0312 04/10/21 1500  NA 137  135   < > 134* 135  K 3.9  3.8   < > 3.6 3.4*  CL 103  102   < > 104 104  CO2 27  27   < > 27 26  GLUCOSE 233*  238*   < > 237* 266*  BUN 40*  39*   < > 20 18  CREATININE 2.18*  2.33*   < > 1.09* 0.93  CALCIUM 7.3*  7.2*   < > 7.3* 7.2*  MG 2.3  --  2.3  --   PHOS 3.3  3.3   < > 1.6* 2.7   < > = values in this interval not displayed.   Liver Function Tests Recent Labs    04/09/21 0521 04/09/21 1601 04/10/21 0312 04/10/21 1500  AST 38  --   --   --   ALT 31  --   --   --   ALKPHOS 44  --   --   --   BILITOT 1.3*  --   --   --   PROT 4.3*  --   --   --   ALBUMIN 2.0*  2.0*   < > 1.8* 1.7*   < > = values in this interval not displayed.   No results for input(s): LIPASE, AMYLASE in the last 72 hours. Cardiac Enzymes No results for input(s): CKTOTAL, CKMB, CKMBINDEX, TROPONINI in the last 72 hours. BNP No results for input(s): BNP in the last 72 hours. D-Dimer No results for input(s): DDIMER in the last 72 hours. Hemoglobin A1C Recent Labs    04/21/2021 1651  HGBA1C 5.7*   Fasting Lipid Panel Recent Labs    04/09/21 0521  TRIG 224*   Thyroid Function Tests No results for input(s): TSH, T4TOTAL, T3FREE, THYROIDAB in the last 72 hours.  Invalid input(s): FREET3  Telemetry    A. fib with controlled ventricular response  ECG       Radiology    CT ABDOMEN PELVIS WO CONTRAST  Result Date: 04/30/2021 CLINICAL DATA:  Nonlocalized acute abdominal pain. Nausea and weakness. Decreased urination over the past 4 days EXAM: CT ABDOMEN AND PELVIS WITHOUT CONTRAST TECHNIQUE: Multidetector CT imaging of the abdomen and pelvis was performed following the standard protocol without IV contrast. COMPARISON:  None. FINDINGS: Lower chest: No  acute abnormality. Hepatobiliary: Several similar-appearing fluid density lesions are noted within the liver with the largest measuring up to 2.3 cm. Findings likely represent hepatic cysts. Subcentimeter hypodensities are too small to characterize. No new focal lesion. No gallstones, gallbladder wall thickening, or pericholecystic fluid. No biliary  dilatation. Pancreas: No focal lesion. Normal pancreatic contour. No surrounding inflammatory changes. No main pancreatic ductal dilatation. Spleen: Normal in size without focal abnormality. Adrenals/Urinary Tract: No adrenal nodule bilaterally. No nephrolithiasis, no hydronephrosis, and no contour-deforming renal mass. No ureterolithiasis or hydroureter. The urinary bladder is decompressed with a urinary Foley catheter terminating within the lumen. Several foci of gas are noted within the urinary bladder lumen as well as within the Foley catheter. Stomach/Bowel: Stomach is within normal limits. Irregular bowel wall and haziness throughout the abdomen pelvis. Question scattered colonic diverticula of the skin colon. No definite bowel dilatation. The appendix not definitely identified. Vascular/Lymphatic: No abdominal aorta or iliac aneurysm. Severe atherosclerotic plaque of the aorta and its branches. No abdominal, pelvic, or inguinal lymphadenopathy. Reproductive: Uterus and bilateral adnexa are unremarkable. Other: Large volume pneumoperitoneum with at least small volume free fluid mesenteric fat stranding throughout the abdomen and pelvis. Musculoskeletal: No abdominal wall hernia or abnormality. No suspicious lytic or blastic osseous lesions. No acute displaced fracture. Multilevel degenerative changes of the spine. IMPRESSION: 1. Bowel perforation with marked inflammatory changes and pneumoperitoneum throughout the abdomen and pelvis. Bowel ischemia not excluded. Markedly limited evaluation on this noncontrast study. Recommend emergent surgical consultation. 2.  Multiple foci of gas noted within the urinary bladder lumen and Foley catheter tubing. Correlate with urinalysis to exclude infection. These results were called by telephone at the time of interpretation on 04/08/2021 at 5:19 am to provider [facility] , who verbally acknowledged these results. Electronically Signed   By: Iven Finn M.D.   On: 04/29/2021 05:28   DG Chest Port 1 View  Result Date: 04/08/2021 CLINICAL DATA:  Central line placement. EXAM: PORTABLE CHEST 1 VIEW COMPARISON:  Radiograph earlier today. FINDINGS: Large-bore left-sided internal jugular catheter is low in positioning at the atrial IVC junction, at or possibly below the diaphragm. No pneumothorax. Right internal central line, endotracheal tube, enteric tube, left-sided pacemaker remain in place. Stable heart size and mediastinal contours. Mild hazy left lung base opacity likely small pleural effusion and atelectasis. Presumed surgical drain in the left upper quadrant. No pulmonary edema. IMPRESSION: 1. Left internal jugular central line with tip low in positioning at the atrial IVC junction, either at or possibly below the diaphragm. No pneumothorax. 2. Remaining support apparatus are stable. 3. Hazy left lung base opacity likely small pleural effusion and atelectasis. Electronically Signed   By: Keith Rake M.D.   On: 04/08/2021 16:54   Portable Chest xray  Result Date: 04/08/2021 CLINICAL DATA:  Respiratory failure. History of diabetes and hypertension. Follow-up exam. EXAM: PORTABLE CHEST 1 VIEW COMPARISON:  04/16/2021. FINDINGS: Cardiac silhouette normal in size.  No mediastinal or hilar masses. Stable prominent bronchovascular markings bilaterally. No convincing pneumonia or pulmonary edema. Endotracheal tube, nasal/orogastric tube, right internal jugular central venous catheter and left anterior chest wall AICD are all stable. IMPRESSION: 1. No acute cardiopulmonary disease. 2. Support apparatus is stable and well  positioned. Electronically Signed   By: Lajean Manes M.D.   On: 04/08/2021 07:46   DG Chest Port 1 View  Result Date: 04/21/2021 CLINICAL DATA:  Respiratory failure. EXAM: PORTABLE CHEST 1 VIEW COMPARISON:  May 30, 2020. FINDINGS: The heart size and mediastinal contours are within normal limits. Both lungs are clear. Endotracheal and nasogastric tubes are in good position. Left-sided pacemaker is unchanged in position. Right internal jugular catheter is in good position. The visualized skeletal structures are unremarkable. IMPRESSION: No active disease. Aortic Atherosclerosis (ICD10-I70.0). Electronically Signed  By: Marijo Conception M.D.   On: 04/08/2021 13:48   ECHOCARDIOGRAM COMPLETE  Result Date: 04/08/2021    ECHOCARDIOGRAM REPORT   Patient Name:   Lindsay [name] Date of Exam: 04/08/2021 Medical Rec #:  [id number]     Height:       65.0 in Accession #:    [id number]    Weight:       140.0 lb Date of Birth:  [birth year]    BSA:          1.700 m Patient Age:    67 years      BP:           144/86 mmHg Patient Gender: F             HR:           112 bpm. Exam Location:  [facility] Procedure: 2D Echo Indications:     Hypertrophic Obstructive Cardiomyopathy  History:         Patient has no prior history of Echocardiogram examinations.                  Arrythmias:Atrial Fibrillation.  Sonographer:     Carlean Jews Thornton-Maynard Referring Phys:  [id number] Tyler Pita Diagnosing Phys: Bartholome Bill MD IMPRESSIONS  1. Left ventricular ejection fraction, by estimation, is 65 to 70%. The left ventricle has normal function. The left ventricle has no regional wall motion abnormalities. There is moderate asymmetric left ventricular hypertrophy of the basal-septal segment. Left ventricular diastolic parameters were normal.  2. Right ventricular systolic function is normal. The right ventricular size is normal. There is normal pulmonary artery systolic pressure.  3. Left atrial size was mildly dilated.  4. Right atrial size was mildly  dilated.  5. A small pericardial effusion is present. The pericardial effusion is posterior to the left ventricle and the left atrium. There is no evidence of cardiac tamponade.  6. The mitral valve is grossly normal. Moderate mitral valve regurgitation.  7. The aortic valve was not well visualized. Aortic valve regurgitation is trivial. FINDINGS  Left Ventricle: Left ventricular ejection fraction, by estimation, is 65 to 70%. The left ventricle has normal function. The left ventricle has no regional wall motion abnormalities. The left ventricular internal cavity size was normal in size. There is  moderate asymmetric left ventricular hypertrophy of the basal-septal segment. Left ventricular diastolic parameters were normal. Right Ventricle: The right ventricular size is normal. No increase in right ventricular wall thickness. Right ventricular systolic function is normal. There is normal pulmonary artery systolic pressure. The tricuspid regurgitant velocity is 2.38 m/s, and  with an assumed right atrial pressure of 3 mmHg, the estimated right ventricular systolic pressure is 25.6 mmHg. Left Atrium: Left atrial size was mildly dilated. Right Atrium: Right atrial size was mildly dilated. Pericardium: A small pericardial effusion is present. The pericardial effusion is posterior to the left ventricle and the left atrium. There is no evidence of cardiac tamponade. Mitral Valve: The mitral valve is grossly normal. Moderate mitral valve regurgitation. MV peak gradient, 6.5 mmHg. The mean mitral valve gradient is 3.0 mmHg. Tricuspid Valve: The tricuspid valve is grossly normal. Tricuspid valve regurgitation is mild. Aortic Valve: The aortic valve was not well visualized. Aortic valve regurgitation is trivial. Aortic valve mean gradient measures 4.0 mmHg. Aortic valve peak gradient measures 6.6 mmHg. Aortic valve area, by VTI measures 2.50 cm. Pulmonic Valve: The pulmonic valve was not well visualized. Pulmonic valve  regurgitation is trivial. Aorta: The  aortic root is normal in size and structure. IAS/Shunts: The atrial septum is grossly normal. Additional Comments: A device lead is visualized.  LEFT VENTRICLE PLAX 2D LVIDd:         2.29 cm  Diastology LVIDs:         1.54 cm  LV e' medial:    5.33 cm/s LV PW:         1.84 cm  LV E/e' medial:  22.6 LV IVS:        2.19 cm  LV e' lateral:   6.74 cm/s LVOT diam:     2.00 cm  LV E/e' lateral: 17.9 LV SV:         52 LV SV Index:   30 LVOT Area:     3.14 cm  RIGHT VENTRICLE RV S prime:     8.59 cm/s LEFT ATRIUM             Index       RIGHT ATRIUM           Index LA diam:        4.30 cm 2.53 cm/m  RA Area:     20.10 cm LA Vol (A2C):   80.4 ml 47.30 ml/m RA Volume:   54.80 ml  32.24 ml/m LA Vol (A4C):   97.8 ml 57.53 ml/m LA Biplane Vol: 88.8 ml 52.24 ml/m  AORTIC VALVE                   PULMONIC VALVE AV Area (Vmax):    2.82 cm    PV Vmax:       1.14 m/s AV Area (Vmean):   2.67 cm    PV Peak grad:  5.2 mmHg AV Area (VTI):     2.50 cm AV Vmax:           128.00 cm/s AV Vmean:          89.700 cm/s AV VTI:            0.207 m AV Peak Grad:      6.6 mmHg AV Mean Grad:      4.0 mmHg LVOT Vmax:         115.00 cm/s LVOT Vmean:        76.200 cm/s LVOT VTI:          0.165 m LVOT/AV VTI ratio: 0.80  AORTA Ao Root diam: 2.80 cm MITRAL VALVE                 TRICUSPID VALVE MV Area (PHT): 2.86 cm      TR Peak grad:   22.7 mmHg MV Area VTI:   2.73 cm      TR Vmax:        238.00 cm/s MV Peak grad:  6.5 mmHg MV Mean grad:  3.0 mmHg      SHUNTS MV Vmax:       1.27 m/s      Systemic VTI:  0.16 m MV Vmean:      86.2 cm/s     Systemic Diam: 2.00 cm MR Peak grad:    69.2 mmHg MR Mean grad:    41.0 mmHg MR Vmax:         416.00 cm/s MR Vmean:        298.0 cm/s MR PISA:         3.08 cm MR PISA Eff ROA: 51 mm MR PISA Radius:  0.70 cm MV E velocity: 120.33 cm/s Bartholome Bill  MD Electronically signed by Bartholome Bill MD Signature Date/Time: 04/08/2021/3:42:55 PM    Final     Assessment & Plan       75 year old female with history of hypertrophic obstructive nonsegmental cardiomyopathy was preserved LV function diagnosed in 2005, history of AICD in place for primary prevention status post A. fib ablation x2 admitted with an acute abdomen with perforation and free air.  Status post surgical intervention.  Currently with a narrow complex tachycardia consistent with atrial tachycardia versus atrial fib with RVR.  Rate between 101 120.  1.  Atrial fibrillation-appears to be in an A. fib at present with controlled rate on amiodarone.  We will continue with this.  Has had this as an outpatient.  Has been on a variety of antiarrhythmic medications.  Is status post ablation x2.  Chart reports an intolerance to amiodarone but patient is currently on an amiodarone drip.  The intolerance apparently was agitation and gastric distress.  We will continue with this for now to attempt rate control.    Has had a history of inappropriate treatments due to atrial tachycardia.  Currently will attempt to keep rate slowed to prevent this.  Was on Xarelto as an outpatient.  Will defer this for now status post acute abdomen.  2.  Hypertrophic obstructive cardiomyopathy-being followed at [facility].  Echo done today reveals similar findings to echo done 1 year ago.  EF 60 to 70% with asymmetric septal hypertrophy.  There is no evidence of Sam.  There is mild to moderate MR which has been present previously.    3.  Acute renal insufficiency likely secondary to acute abdomen.  ON CRRT  4.  Acute abdomen-status post bowel perforation status postsurgical resection with partial colectomy.  Has Jackson-Pratt drains and wound VAC in place.  Continue tube feed followed by general surgery.     Signed, Javier Docker Graham Doukas MD 04/10/2021, 4:26 PM  Pager: [phone]

## 2021-04-10 NOTE — Progress Notes (Signed)
Pt care assumed. Poc noted. No changes to note. Pt remains on prop/levo gtt. Crrt ongoing. Will continue as ordered.

## 2021-04-10 NOTE — Progress Notes (Signed)
NAME:  Lindsay [name], MRN:  [id number], DOB:  Jan 12, 1946, LOS: 3 ADMISSION DATE:  04/22/2021  History of Present Illness:  75 year old lifelong never smoker with a history as noted below, who presented to [facility] today after a 5-day history of constant sharp and severe bilateral lower abdominal pain associated with daily episodes of nonbloody and nonbilious emesis.  The patient had not been able to take significant p.o. intake for approximately 5 days.  She endorsed to the emergency room physician that she had not made urine in 4 days PTA.  She presented to [facility] in the early hours of the morning feeling that she was in a pass out and feeling extremely weak.  She did not endorse any shortness of breath fevers or chills.  She stated that she was having bowel movements.  No constipation or diarrhea.  On evaluation at the emergency room the patient was noted to be tachypneic and described as being pale and sick appearing.  She was noted to have diffusely tender abdomen particular in the lower quadrants but described not to be having any rebound.  A CT abdomen and pelvis was obtained and this showed bowel perforation with marked inflammatory changes and severe pneumoperitoneum throughout the abdomen and pelvis.  Bowel ischemia could not be excluded.  Emergent surgical consultation was recommended.   This point Dr. Fredirick Maudlin was consulted emergently and the patient went emergently to the operating room.  Dr. Celine Ahr performed an exploratory laparotomy, mobilization of the splenic flexure, extensive adhesion lysis, low anterior resection, descending colostomy, left salpingo-oophorectomy and placement of a wound VAC.  Dr. Celine Ahr described that all upon entering the abdomen roughly 1.5 L of pus were released.  There was no obvious feculent drainage.  During the procedure a small splenic laceration occur during the exploration which was managed with topical hemostatic agents.  Patient also had placement of multiple  drains in the abdomen.  The patient had central line arterial line placed prior to surgery.  Patient received appropriate volume resuscitation with crystalloid and colloid and was transferred to the intensive care unit after the procedure and mechanically ventilated status.  She was at the time requiring pressors in the form of norepinephrine.  At that point PCCM was consulted for management of the patient's critical illness.   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  She has a history of A. fib and is on amiodarone and anticoagulation with Xarelto.   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 Fib Diverticulitis   Significant Hospital Events: Including procedures, antibiotic start and stop dates in addition to other pertinent events  04/30/2021 admitted post emergent ex lap for perforated viscus, peritonitis, severe sepsis 04/06/2021 piperacillin tazobactam and Eraxis initiated 7/4 remains critically ill, severe multiorgan failure 7/5 remains critically ill, severe shock, on CRRT 7/6 remains on vent, pressors and CRRT     Interim History / Subjective:  Multiorgan failure Severe pain Septic shock Remains critically ill    Antimicrobials:   Antibiotics Given (last 72 hours)     Date/Time Action Medication Dose Rate   04/21/2021 1333 New Bag/Given   piperacillin-tazobactam (ZOSYN) IVPB 2.25 g 2.25 g 100 mL/hr   04/05/2021 2206 New Bag/Given   piperacillin-tazobactam (ZOSYN) IVPB 2.25 g 2.25 g 100 mL/hr   04/08/21 0542 New Bag/Given   piperacillin-tazobactam (ZOSYN) IVPB 2.25 g 2.25 g 100 mL/hr   04/08/21 1334  New Bag/Given   piperacillin-tazobactam (ZOSYN) IVPB 2.25 g 2.25 g 100 mL/hr   04/08/21 2005 New Bag/Given   piperacillin-tazobactam (ZOSYN) IVPB 3.375 g 3.375 g 100 mL/hr   04/09/21 0246 New Bag/Given   piperacillin-tazobactam (ZOSYN) IVPB 3.375 g 3.375 g  100 mL/hr   04/09/21 0754 New Bag/Given   piperacillin-tazobactam (ZOSYN) IVPB 3.375 g 3.375 g 100 mL/hr   04/09/21 1317 New Bag/Given   piperacillin-tazobactam (ZOSYN) IVPB 3.375 g 3.375 g 100 mL/hr   04/09/21 2016 New Bag/Given   piperacillin-tazobactam (ZOSYN) IVPB 3.375 g 3.375 g 100 mL/hr   04/10/21 0220 New Bag/Given   piperacillin-tazobactam (ZOSYN) IVPB 3.375 g 3.375 g 100 mL/hr           Objective   Blood pressure 118/80, pulse 67, temperature (!) 93.2 F (34 C), resp. rate 20, height 5\' 5"  (1.651 m), weight 78.6 kg, SpO2 99 %.    Vent Mode: PRVC FiO2 (%):  [24 %] 24 % Set Rate:  [20 bmp] 20 bmp Vt Set:  [450 mL] 450 mL PEEP:  [5 cmH20] 5 cmH20 Plateau Pressure:  [19 cmH20] 19 cmH20   Intake/Output Summary (Last 24 hours) at 04/10/2021 0973 Last data filed at 04/10/2021 0700 Gross per 24 hour  Intake 4407.4 ml  Output 3404 ml  Net 1003.4 ml    Filed Weights   04/10/2021 0310 04/10/21 0333  Weight: 63.5 kg 78.6 kg    REVIEW OF SYSTEMS  PATIENT IS UNABLE TO PROVIDE COMPLETE REVIEW OF SYSTEMS DUE TO SEVERE CRITICAL ILLNESS AND TOXIC METABOLIC ENCEPHALOPATHY   PHYSICAL EXAMINATION:  GENERAL:critically ill appearing, +resp distress HEAD: Normocephalic, atraumatic.  EYES: Pupils equal, round, reactive to light.  No scleral icterus.  MOUTH: Moist mucosal membrane. NECK: Supple. No thyromegaly. No nodules. No JVD.  PULMONARY: +rhonchi, +wheezing CARDIOVASCULAR: S1 and S2. Regular rate and rhythm. No murmurs, rubs, or gallops.  GASTROINTESTINAL: +distended drains in place MUSCULOSKELETAL: No swelling, clubbing, or edema.  NEUROLOGIC: obtunded SKIN:intact,warm,dry   Labs/imaging that I havepersonally reviewed  (right click and "Reselect all SmartList Selections" daily)      ASSESSMENT AND PLAN SYNOPSIS    75 yo female with severe bowel perforation SEPSIS PRESENT ON ADMISSION and perforated ABD with ABD abscess with severe septic shock s/p ex lap with  progressive multiorgan failure  Severe ACUTE Hypoxic and Hypercapnic Respiratory Failure -continue Mechanical Ventilator support -continue Bronchodilator Therapy -Wean Fio2 and PEEP as tolerated -VAP/VENT bundle implementation Unable to wean from vent  CARDIAC ICU monitoring  ACUTE KIDNEY INJURY/Renal Failure -continue Foley Catheter-assess need -Avoid nephrotoxic agents -Follow urine output, BMP -Ensure adequate renal perfusion, optimize oxygenation -Renal dose medications On CRRT   NEUROLOGY ACUTE TOXIC METABOLIC ENCEPHALOPATHY -need for sedation -Goal RASS -2 to -3  Septic shock -use vasopressors to keep MAP>65 -follow ABG and LA -follow up cultures -emperic ABX -consider stress dose steroids  INFECTIOUS DISEASE -continue antibiotics as prescribed Peritonitis with severe sepsis Bowel perforation Severe sepsis  ENDO - ICU hypoglycemic\Hyperglycemia protocol -check FSBS per protocol   GI GI PROPHYLAXIS as indicated  NUTRITIONAL STATUS DIET-->started TPN Constipation protocol as indicated Drains in place Follow up gen surgery recs Wound vac to be placed today   ELECTROLYTES -follow labs as needed -replace as needed -pharmacy consultation and following     History of hypertrophic cardiomyopathy Unstable HR On amio Cardiology consultation as needed Not a candidate for [facility]  Best practice (right click and "Reselect all SmartList Selections" daily)  Diet:  NPO Pain/Anxiety/Delirium protocol (if  indicated): Yes (RASS goal -2) VAP protocol (if indicated): Yes DVT prophylaxis: Subcutaneous Heparin GI prophylaxis: PPI Glucose control:  SSI Yes Central venous access:  Yes, and it is still needed Arterial line:  Yes, and it is still needed Foley:  Yes, and it is still needed Mobility:  bed rest  PT consulted: N/A Code Status:  DNR Disposition: ICU  Labs   CBC: Recent Labs  Lab 04/21/2021 0348 04/11/2021 1256 04/14/2021 1651 04/08/21 0307  04/09/21 0521 04/10/21 0312  WBC 19.0* 14.9*  --  21.3* 19.3* 21.8*  NEUTROABS  --  14.5*  --   --  18.5* 20.4*  HGB 12.0 6.7* 10.5* 9.0* 8.3* 8.0*  HCT 38.3 21.3* 31.3* 26.5* 24.2* 23.8*  MCV 79.6* 79.8*  --  81.8 82.9 83.2  PLT 461* 266  --  167 131* 120*     Basic Metabolic Panel: Recent Labs  Lab 04/15/2021 1256 04/08/21 0307 04/08/21 1650 04/09/21 0521 04/09/21 1601 04/10/21 0312  NA 132* 132* 135 137  135 136 134*  K 4.3 3.8 3.9 3.9  3.8 3.8 3.6  CL 102 101 102 103  102 102 104  CO2 19* 19* 16* 27  27 26 27   GLUCOSE 152* 233* 204* 233*  238* 218* 237*  BUN 70* 71* 79* 40*  39* 25* 20  CREATININE 3.95* 4.24* 4.90* 2.18*  2.33* 1.48* 1.09*  CALCIUM 7.5* 7.2* 7.1* 7.3*  7.2* 7.5* 7.3*  MG 2.0 1.9  --  2.3  --  2.3  PHOS  --  7.4* 7.6* 3.3  3.3 1.9* 1.6*    GFR: Estimated Creatinine Clearance: 46.2 mL/min (A) (by C-G formula based on SCr of 1.09 mg/dL (H)). Recent Labs  Lab 04/28/2021 0726 04/13/2021 1256 04/08/21 0307 04/09/21 0521 04/09/21 1124 04/10/21 0312  PROCALCITON  --  15.49 22.12 11.60  --   --   WBC  --  14.9* 21.3* 19.3*  --  21.8*  LATICACIDVEN 2.9* 2.9* 1.4  --  1.3  --      Liver Function Tests: Recent Labs  Lab 05/01/2021 0348 04/23/2021 1256 04/08/21 0307 04/08/21 1650 04/09/21 0521 04/09/21 1601 04/10/21 0312  AST 56* 42*  --   --  38  --   --   ALT 41 21  --   --  31  --   --   ALKPHOS 70 28*  --   --  44  --   --   BILITOT 0.9 1.4*  --   --  1.3*  --   --   PROT 6.6 3.8*  --   --  4.3*  --   --   ALBUMIN 2.6* 2.3* 2.4* 2.2* 2.0*  2.0* 1.9* 1.8*    Recent Labs  Lab 04/23/2021 0348  LIPASE 41    No results for input(s): AMMONIA in the last 168 hours.  ABG    Component Value Date/Time   PHART 7.38 04/09/2021 0341   PCO2ART 40 04/09/2021 0341   PO2ART 115 (H) 04/09/2021 0341   HCO3 23.7 04/09/2021 0341   ACIDBASEDEF 1.3 04/09/2021 0341   O2SAT 98.4 04/09/2021 0341      Coagulation Profile: Recent Labs  Lab  04/17/2021 1256  INR 3.3*     Cardiac Enzymes: No results for input(s): CKTOTAL, CKMB, CKMBINDEX, TROPONINI in the last 168 hours.  HbA1C: Hgb A1c MFr Bld  Date/Time Value Ref Range Status  04/12/2021 04:51 PM 5.7 (H) 4.8 - 5.6 % Final    Comment:    (  NOTE)         Prediabetes: 5.7 - 6.4         Diabetes: >6.4         Glycemic control for adults with diabetes: <7.0     CBG: Recent Labs  Lab 04/09/21 1149 04/09/21 1546 04/09/21 1941 04/09/21 2329 04/10/21 0342  GLUCAP 220* 184* 177* 225* 211*     Allergies Allergies  Allergen Reactions   Amiodarone     Other reaction(s): Other (See Comments) Nausea, diarrhea, weight loss, anxiety, jitteriness   Levofloxacin Rash    Red palms, lips, forearms    Meloxicam Shortness Of Breath    Other reaction(s): Respiratory Distress   Amlodipine     Other reaction(s): Other (See Comments) Caused severe gingival hyperplasia   Amlodipine Besy-Benazepril Hcl     Severe gingival hyperplasia  Other reaction(s): Other (See Comments) Other reaction(s): Other   Beta Adrenergic Blockers     States severe fatigue  Other reaction(s): Other (See Comments) fatigue         DVT/GI PRX  assessed I Assessed the need for Labs I Assessed the need for Foley I Assessed the need for Central Venous Line Family Discussion when available I Assessed the need for Mobilization I made an Assessment of medications to be adjusted accordingly Safety Risk assessment completed  CASE DISCUSSED IN MULTIDISCIPLINARY ROUNDS WITH ICU TEAM     Critical Care Time devoted to patient care services described in this note is 55 minutes.  Critical care was necessary to treat /prevent imminent and life-threatening deterioration. Overall, patient is critically ill, prognosis is guarded.  Patient with Multiorgan failure and at high risk for cardiac arrest and death.    Corrin Parker, M.D.  [facility]  Medical Director  [place] Director [facility] Department

## 2021-04-10 NOTE — Progress Notes (Signed)
[facility]  ROUNDING NOTE   Subjective:  Patient remains critically ill currently. Still on CRRT. Fluid balance goal is net even. Patient is quite sensitive to pressors and drops blood pressure rapidly when she comes off of norepinephrine. Remains oliguric at this time.   Objective:  Vital signs in last 24 hours:  Temp:  [93.02 F (33.9 C)-95.36 F (35.2 C)] 93.02 F (33.9 C) (07/06 0800) Pulse Rate:  [25-123] 66 (07/06 0800) Resp:  [16-26] 20 (07/06 0800) BP: (71-187)/(43-131) 123/71 (07/06 0800) SpO2:  [94 %-99 %] 98 % (07/06 0823) Arterial Line BP: (63-242)/(30-90) 150/53 (07/06 0800) FiO2 (%):  [24 %] 24 % (07/06 0823) Weight:  [78.6 kg] 78.6 kg (07/06 0333)  Weight change:  Filed Weights   04/08/2021 0310 04/10/21 0333  Weight: 63.5 kg 78.6 kg    Intake/Output: I/O last 3 completed shifts: In: 5775.5 [I.V.:4150.4; Other:270; IV Piggyback:1355.1] Out: 5206 [Urine:50; Emesis/NG output:50; Drains:269; VQMGQ:6761; Stool:40]   Intake/Output this shift:  Total I/O In: 158.3 [I.V.:115.7; IV Piggyback:42.6] Out: 125 [Other:125]  Physical Exam: General: Critically ill-appearing  Head: Normocephalic, atraumatic.  Endotracheal tube in place  Eyes: Anicteric  Neck: Supple  Lungs:  Clear to auscultation, vent assisted  Heart: S1S2 tachycardic  Abdomen:  Surgical wound noted, wound VAC in place  Extremities: Trace peripheral edema.  Neurologic: Intubated, sedated  Skin: No acute rash  Access: Left IJ temporary dialysis catheter    Basic Metabolic Panel: Recent Labs  Lab 04/05/2021 1256 04/08/21 0307 04/08/21 1650 04/09/21 0521 04/09/21 1601 04/10/21 0312  NA 132* 132* 135 137  135 136 134*  K 4.3 3.8 3.9 3.9  3.8 3.8 3.6  CL 102 101 102 103  102 102 104  CO2 19* 19* 16* _0 GLUCOSE 152* 233* 204* 233*  238* 218* 237*  BUN 70* 71* 79* 40*  39* 25* 20  CREATININE 3.95* 4.24* 4.90* 2.18*  2.33* 1.48* 1.09*  CALCIUM 7.5* 7.2* 7.1*  7.3*  7.2* 7.5* 7.3*  MG 2.0 1.9  --  2.3  --  2.3  PHOS  --  7.4* 7.6* 3.3  3.3 1.9* 1.6*     Liver Function Tests: Recent Labs  Lab 05/03/2021 0348 05/02/2021 1256 04/08/21 0307 04/08/21 1650 04/09/21 0521 04/09/21 1601 04/10/21 0312  AST 56* 42*  --   --  38  --   --   ALT 41 21  --   --  31  --   --   ALKPHOS 70 28*  --   --  44  --   --   BILITOT 0.9 1.4*  --   --  1.3*  --   --   PROT 6.6 3.8*  --   --  4.3*  --   --   ALBUMIN 2.6* 2.3* 2.4* 2.2* 2.0*  2.0* 1.9* 1.8*    Recent Labs  Lab 04/19/2021 0348  LIPASE 41    No results for input(s): AMMONIA in the last 168 hours.  CBC: Recent Labs  Lab 04/18/2021 0348 04/12/2021 1256 04/13/2021 1651 04/08/21 0307 04/09/21 0521 04/10/21 0312  WBC 19.0* 14.9*  --  21.3* 19.3* 21.8*  NEUTROABS  --  14.5*  --   --  18.5* 20.4*  HGB 12.0 6.7* 10.5* 9.0* 8.3* 8.0*  HCT 38.3 21.3* 31.3* 26.5* 24.2* 23.8*  MCV 79.6* 79.8*  --  81.8 82.9 83.2  PLT 461* 266  --  167 131* 120*     Cardiac  Enzymes: No results for input(s): CKTOTAL, CKMB, CKMBINDEX, TROPONINI in the last 168 hours.  BNP: Invalid input(s): POCBNP  CBG: Recent Labs  Lab 04/09/21 1546 04/09/21 1941 04/09/21 2329 04/10/21 0342 04/10/21 0737  GLUCAP 184* 177* 225* 211* 194*     Microbiology: Results for orders placed or performed during the hospital encounter of 04/11/2021  Resp Panel by RT-PCR (Flu A&B, Covid) Nasopharyngeal Swab     Status: None   Collection Time: 05/05/2021  4:40 AM   Specimen: Nasopharyngeal Swab; Nasopharyngeal(NP) swabs in vial transport medium  Result Value Ref Range Status   SARS Coronavirus 2 by RT PCR NEGATIVE NEGATIVE Final    Comment: (NOTE) SARS-CoV-2 target nucleic acids are NOT DETECTED.  The SARS-CoV-2 RNA is generally detectable in upper respiratory specimens during the acute phase of infection. The lowest concentration of SARS-CoV-2 viral copies this assay can detect is 138 copies/mL. A negative result does not  preclude SARS-Cov-2 infection and should not be used as the sole basis for treatment or other patient management decisions. A negative result may occur with  improper specimen collection/handling, submission of specimen other than nasopharyngeal swab, presence of viral mutation(s) within the areas targeted by this assay, and inadequate number of viral copies(<138 copies/mL). A negative result must be combined with clinical observations, patient history, and epidemiological information. The expected result is Negative.  Fact Sheet for Patients:  EntrepreneurPulse.com.au  Fact Sheet for Healthcare Providers:  IncredibleEmployment.be  This test is no t yet approved or cleared by the [place] FDA and  has been authorized for detection and/or diagnosis of SARS-CoV-2 by FDA under an Emergency Use Authorization (EUA). This EUA will remain  in effect (meaning this test can be used) for the duration of the COVID-19 declaration under Section 564(b)(1) of the Act, 21 U.S.C.section 360bbb-3(b)(1), unless the authorization is terminated  or revoked sooner.       Influenza A by PCR NEGATIVE NEGATIVE Final   Influenza B by PCR NEGATIVE NEGATIVE Final    Comment: (NOTE) The Xpert Xpress SARS-CoV-2/FLU/RSV plus assay is intended as an aid in the diagnosis of influenza from Nasopharyngeal swab specimens and should not be used as a sole basis for treatment. Nasal washings and aspirates are unacceptable for Xpert Xpress SARS-CoV-2/FLU/RSV testing.  Fact Sheet for Patients: EntrepreneurPulse.com.au  Fact Sheet for Healthcare Providers: IncredibleEmployment.be  This test is not yet approved or cleared by the [place] FDA and has been authorized for detection and/or diagnosis of SARS-CoV-2 by FDA under an Emergency Use Authorization (EUA). This EUA will remain in effect (meaning this test can be used) for the  duration of the COVID-19 declaration under Section 564(b)(1) of the Act, 21 U.S.C. section 360bbb-3(b)(1), unless the authorization is terminated or revoked.  Performed at [facility], [address] [place], [place] [address]   Blood culture (routine x 2)     Status: None (Preliminary result)   Collection Time: 04/23/2021  5:33 AM   Specimen: BLOOD  Result Value Ref Range Status   Specimen Description BLOOD LEFT ANTECUBITAL  Final   Special Requests   Final    BOTTLES DRAWN AEROBIC AND ANAEROBIC Blood Culture adequate volume   Culture   Final    NO GROWTH 3 DAYS Performed at [facility], [address] [place], [place] [address]    Report Status PENDING  Incomplete  Blood culture (routine x 2)     Status: None (Preliminary result)   Collection Time: 04/16/2021  7:40 AM  Specimen: BLOOD  Result Value Ref Range Status   Specimen Description BLOOD BLOOD RIGHT WRIST  Final   Special Requests   Final    BOTTLES DRAWN AEROBIC AND ANAEROBIC Blood Culture adequate volume   Culture   Final    NO GROWTH 3 DAYS Performed at [facility], [address] [place], [place] [address]    Report Status PENDING  Incomplete  MRSA Next Gen by PCR, Nasal     Status: None   Collection Time: 05/01/2021 12:50 PM   Specimen: Nasal Mucosa; Nasal Swab  Result Value Ref Range Status   MRSA by PCR Next Gen NOT DETECTED NOT DETECTED Final    Comment: (NOTE) The GeneXpert MRSA Assay (FDA approved for NASAL specimens only), is one component of a comprehensive MRSA colonization surveillance program. It is not intended to diagnose MRSA infection nor to guide or monitor treatment for MRSA infections. Test performance is not FDA approved in patients less than 30 years old. Performed at [facility], [address] [place], [place] [address]     Coagulation Studies: Recent Labs    04/18/2021 1256  LABPROT 33.4*  INR 3.3*     Urinalysis: No results for  input(s): COLORURINE, LABSPEC, PHURINE, GLUCOSEU, HGBUR, BILIRUBINUR, KETONESUR, PROTEINUR, UROBILINOGEN, NITRITE, LEUKOCYTESUR in the last 72 hours.  Invalid input(s): APPERANCEUR     Imaging: DG Chest Port 1 View  Result Date: 04/08/2021 CLINICAL DATA:  Central line placement. EXAM: PORTABLE CHEST 1 VIEW COMPARISON:  Radiograph earlier today. FINDINGS: Large-bore left-sided internal jugular catheter is low in positioning at the atrial IVC junction, at or possibly below the diaphragm. No pneumothorax. Right internal central line, endotracheal tube, enteric tube, left-sided pacemaker remain in place. Stable heart size and mediastinal contours. Mild hazy left lung base opacity likely small pleural effusion and atelectasis. Presumed surgical drain in the left upper quadrant. No pulmonary edema. IMPRESSION: 1. Left internal jugular central line with tip low in positioning at the atrial IVC junction, either at or possibly below the diaphragm. No pneumothorax. 2. Remaining support apparatus are stable. 3. Hazy left lung base opacity likely small pleural effusion and atelectasis. Electronically Signed   By: Keith Rake M.D.   On: 04/08/2021 16:54   ECHOCARDIOGRAM COMPLETE  Result Date: 04/08/2021    ECHOCARDIOGRAM REPORT   Patient Name:   Lindsay [name] Date of Exam: 04/08/2021 Medical Rec #:  [id number]     Height:       65.0 in Accession #:    [id number]    Weight:       140.0 lb Date of Birth:  20-Aug-1946    BSA:          1.700 m Patient Age:    75 years      BP:           144/86 mmHg Patient Gender: F             HR:           112 bpm. Exam Location:  [facility] Procedure: 2D Echo Indications:     Hypertrophic Obstructive Cardiomyopathy  History:         Patient has no prior history of Echocardiogram examinations.                  Arrythmias:Atrial Fibrillation.  Sonographer:     Carlean Jews Thornton-Maynard Referring Phys:  [id number] Tyler Pita Diagnosing Phys: Bartholome Bill MD IMPRESSIONS  1. Left ventricular ejection  fraction, by estimation,  is 65 to 70%. The left ventricle has normal function. The left ventricle has no regional wall motion abnormalities. There is moderate asymmetric left ventricular hypertrophy of the basal-septal segment. Left ventricular diastolic parameters were normal.  2. Right ventricular systolic function is normal. The right ventricular size is normal. There is normal pulmonary artery systolic pressure.  3. Left atrial size was mildly dilated.  4. Right atrial size was mildly dilated.  5. A small pericardial effusion is present. The pericardial effusion is posterior to the left ventricle and the left atrium. There is no evidence of cardiac tamponade.  6. The mitral valve is grossly normal. Moderate mitral valve regurgitation.  7. The aortic valve was not well visualized. Aortic valve regurgitation is trivial. FINDINGS  Left Ventricle: Left ventricular ejection fraction, by estimation, is 65 to 70%. The left ventricle has normal function. The left ventricle has no regional wall motion abnormalities. The left ventricular internal cavity size was normal in size. There is  moderate asymmetric left ventricular hypertrophy of the basal-septal segment. Left ventricular diastolic parameters were normal. Right Ventricle: The right ventricular size is normal. No increase in right ventricular wall thickness. Right ventricular systolic function is normal. There is normal pulmonary artery systolic pressure. The tricuspid regurgitant velocity is 2.38 m/s, and  with an assumed right atrial pressure of 3 mmHg, the estimated right ventricular systolic pressure is 81.4 mmHg. Left Atrium: Left atrial size was mildly dilated. Right Atrium: Right atrial size was mildly dilated. Pericardium: A small pericardial effusion is present. The pericardial effusion is posterior to the left ventricle and the left atrium. There is no evidence of cardiac tamponade. Mitral Valve: The mitral valve is grossly normal. Moderate mitral valve  regurgitation. MV peak gradient, 6.5 mmHg. The mean mitral valve gradient is 3.0 mmHg. Tricuspid Valve: The tricuspid valve is grossly normal. Tricuspid valve regurgitation is mild. Aortic Valve: The aortic valve was not well visualized. Aortic valve regurgitation is trivial. Aortic valve mean gradient measures 4.0 mmHg. Aortic valve peak gradient measures 6.6 mmHg. Aortic valve area, by VTI measures 2.50 cm. Pulmonic Valve: The pulmonic valve was not well visualized. Pulmonic valve regurgitation is trivial. Aorta: The aortic root is normal in size and structure. IAS/Shunts: The atrial septum is grossly normal. Additional Comments: A device lead is visualized.  LEFT VENTRICLE PLAX 2D LVIDd:         2.29 cm  Diastology LVIDs:         1.54 cm  LV e' medial:    5.33 cm/s LV PW:         1.84 cm  LV E/e' medial:  22.6 LV IVS:        2.19 cm  LV e' lateral:   6.74 cm/s LVOT diam:     2.00 cm  LV E/e' lateral: 17.9 LV SV:         52 LV SV Index:   30 LVOT Area:     3.14 cm  RIGHT VENTRICLE RV S prime:     8.59 cm/s LEFT ATRIUM             Index       RIGHT ATRIUM           Index LA diam:        4.30 cm 2.53 cm/m  RA Area:     20.10 cm LA Vol (A2C):   80.4 ml 47.30 ml/m RA Volume:   54.80 ml  32.24 ml/m LA Vol (A4C):   97.8  ml 57.53 ml/m LA Biplane Vol: 88.8 ml 52.24 ml/m  AORTIC VALVE                   PULMONIC VALVE AV Area (Vmax):    2.82 cm    PV Vmax:       1.14 m/s AV Area (Vmean):   2.67 cm    PV Peak grad:  5.2 mmHg AV Area (VTI):     2.50 cm AV Vmax:           128.00 cm/s AV Vmean:          89.700 cm/s AV VTI:            0.207 m AV Peak Grad:      6.6 mmHg AV Mean Grad:      4.0 mmHg LVOT Vmax:         115.00 cm/s LVOT Vmean:        76.200 cm/s LVOT VTI:          0.165 m LVOT/AV VTI ratio: 0.80  AORTA Ao Root diam: 2.80 cm MITRAL VALVE                 TRICUSPID VALVE MV Area (PHT): 2.86 cm      TR Peak grad:   22.7 mmHg MV Area VTI:   2.73 cm      TR Vmax:        238.00 cm/s MV Peak grad:  6.5 mmHg MV  Mean grad:  3.0 mmHg      SHUNTS MV Vmax:       1.27 m/s      Systemic VTI:  0.16 m MV Vmean:      86.2 cm/s     Systemic Diam: 2.00 cm MR Peak grad:    69.2 mmHg MR Mean grad:    41.0 mmHg MR Vmax:         416.00 cm/s MR Vmean:        298.0 cm/s MR PISA:         3.08 cm MR PISA Eff ROA: 51 mm MR PISA Radius:  0.70 cm MV E velocity: 120.33 cm/s Bartholome Bill MD Electronically signed by Bartholome Bill MD Signature Date/Time: 04/08/2021/3:42:55 PM    Final      Medications:     prismasol BGK 4/2.5 500 mL/hr at 04/10/21 0848    prismasol BGK 4/2.5 500 mL/hr at 04/10/21 0848   amiodarone 30 mg/hr (04/10/21 0831)   anidulafungin Stopped (04/09/21 1609)   HYDROmorphone 4 mg/hr (04/10/21 0800)   norepinephrine (LEVOPHED) Adult infusion 1.5 mcg/min (04/10/21 0800)   norepinephrine     piperacillin-tazobactam 100 mL/hr at 04/10/21 0800   prismasol BGK 4/2.5 2,000 mL/hr at 04/10/21 0713   propofol (DIPRIVAN) infusion 25 mcg/kg/min (04/10/21 0800)   sodium phosphate  Dextrose 5% IVPB 30 mmol (04/10/21 0834)   TPN ADULT (ION) 80 mL/hr at 04/10/21 0800   vasopressin Stopped (04/09/21 1500)    albuterol  2.5 mg Nebulization Q6H   chlorhexidine gluconate (MEDLINE KIT)  15 mL Mouth Rinse BID   Chlorhexidine Gluconate Cloth  6 each Topical Q0600    HYDROmorphone (DILAUDID) injection  0.5 mg Intravenous Once   insulin aspart  0-15 Units Subcutaneous Q4H   mouth rinse  15 mL Mouth Rinse 10 times per day   pantoprazole (PROTONIX) IV  40 mg Intravenous Daily   heparin sodium (porcine), HYDROmorphone, midazolam, polyvinyl alcohol  Assessment/ Plan:  75 y.o. female with a  PMHx of hypertrophic cardiomyopathy, history of AICD placement, history of ventricular tachycardia, atrial fibrillation, history of diverticulitis, who was admitted to [facility] on 05/04/2021 for evaluation of diffuse abdominal pain.  She was found to have bowel perforation with significant pneumoperitoneum.   1.  Acute kidney injury secondary to  ATN from septic shock. 2.  Metabolic acidosis. 3.  Acute respiratory failure. 4.  Hyponatremia. 5.  Bowel perforation status post exploratory laparotomy. 6.  Septic shock.  Plan: Overall patient appears to be tolerating CRRT well at the moment.  She is extremely pressor sensitive and when she comes off her blood pressure drops rapidly.  Therefore recommend continuing norepinephrine at this time to maintain a map of 65 or greater.  Given her hemodynamic instability we will maintain the patient on CRRT with net even fluid balance target.  Serum electrolytes have improved significantly with CRRT.  Serum potassium currently acceptable at 3.6 with serum bicarbonate also at target at 27.  BUN has come down to 20 with a creatinine of 1.09 as well.  However as before patient has overall guarded prognosis.   LOS: 3 Genevia Bouldin 7/6/20229:26 AM

## 2021-04-10 NOTE — Progress Notes (Signed)
[facility] Day(s): 3.   Post op day(s): 3 Days Post-Op.   Interval History:  Patient seen and examined Patient remains intubated and sedated Tachycardic; in atrial fibrillation; Amiodarone at 30 ml/hr Vasopressors: Levophed at 3.2 mcg/min Leukocytosis up again this morning to 21.8K Hgb stable at 8.0 Renal function making improvements; sCr - 1.09; UO - 0 ccs On CRRT No electrolyte derangements NGT with 50 ccs out Surgical drains             - RUQ: 107 ccs out; serosanguinous             - RLQ: 62 ccs out; serous Colostomy retracted, serosanguinous output  On TPN   Vital signs in last 24 hours: [min-max] current  Temp:  [93.02 F (33.9 C)-95.72 F (35.4 C)] 93.02 F (33.9 C) (07/06 0800) Pulse Rate:  [25-123] 66 (07/06 0800) Resp:  [16-26] 20 (07/06 0800) BP: (71-187)/(43-131) 123/71 (07/06 0800) SpO2:  [94 %-99 %] 98 % (07/06 0823) Arterial Line BP: (63-242)/(30-90) 150/53 (07/06 0800) FiO2 (%):  [24 %] 24 % (07/06 0823) Weight:  [78.6 kg] 78.6 kg (07/06 0333)     Height: 5\' 5"  (165.1 cm) Weight: 78.6 kg (SCD pump removed, wound vac remained) BMI (Calculated): 28.84   Intake/Output last 2 shifts:  07/05 0701 - 07/06 0700 In: 4407.4 [I.V.:2882.3; IV Piggyback:1255.1] Out: 0354 [Emesis/NG output:50; Drains:169; Stool:40]   Physical Exam:  Constitutional: Intubated; Sedated HEENT: NGT in place  Respiratory: on ventilator  Cardiovascular: irregularly irregular; tachycardic Gastrointestinal: Soft, unable to assess tenderness; non-distended, no rebound/guarding. 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 Wound vac replaced.   Midline Wound + Colostomy (04/10/2021):      Labs:  CBC Latest Ref Rng & Units 04/10/2021 04/09/2021 04/08/2021  WBC 4.0 - 10.5 K/uL 21.8(H) 19.3(H) 21.3(H)  Hemoglobin 12.0 - 15.0 g/dL 8.0(L) 8.3(L) 9.0(L)  Hematocrit 36.0 - 46.0 % 23.8(L) 24.2(L) 26.5(L)  Platelets 150 - 400 K/uL 120(L) 131(L) 167   CMP Latest Ref Rng & Units 04/10/2021 04/09/2021 04/09/2021  Glucose 70 - 99 mg/dL 237(H) 218(H) 233(H)  BUN 8 - 23 mg/dL 20 25(H) 40(H)  Creatinine 0.44 - 1.00 mg/dL 1.09(H) 1.48(H) 2.18(H)  Sodium 135 - 145 mmol/L 134(L) 136 137  Potassium 3.5 - 5.1 mmol/L 3.6 3.8 3.9  Chloride 98 - 111 mmol/L 104 102 103  CO2 22 - 32 mmol/L 27 26 27   Calcium 8.9 - 10.3 mg/dL 7.3(L) 7.5(L) 7.3(L)  Total Protein 6.5 - 8.1 g/dL - - -  Total Bilirubin 0.3 - 1.2 mg/dL - - -  Alkaline Phos 38 - 126 U/L - - -  AST 15 - 41 U/L - - -  ALT 0 - 44 U/L - - -   Procedure: Midline wound vac removed without issue. New sponge was cut to shape and placed without issue. Seal obtained. No complications.   Wound measures 21 x 6 x 3 cm   Imaging studies: No new pertinent imaging studies   Assessment/Plan:  75 y.o. critically ill female 3 Days Post-Op s/p exploratory laparotomy, hartman's procedure, salpingo-oopherectomy, and placement of wound vac for perforated diverticulitis with frankly purulent peritonitis   - Appreciate PCCM assistance; ventilator and vasopressor support pre their service - Appreciate nephrology assistance; CRRT -  Continue TPN; NPO + IVF support - Continue IV Abx (Zosyn) - Monitor abdominal examination; on-going colostomy function              - Midline wound care: Wound vac; MWF schedule --> I will ask WOC RN to assist with wound vac and ostomy care             - Pain control prn - Monitor leukocytosis; worsening             - Monitor renal function; improved but without UO   All of the above findings and recommendations were discussed with the medical team   -- Edison Simon, PA-C [facility] 04/10/2021, 8:42  AM [phone] M-F: 7am - 4pm

## 2021-04-11 DIAGNOSIS — J9601 Acute respiratory failure with hypoxia: Secondary | ICD-10-CM | POA: Diagnosis not present

## 2021-04-11 DIAGNOSIS — K651 Peritoneal abscess: Secondary | ICD-10-CM

## 2021-04-11 DIAGNOSIS — K631 Perforation of intestine (nontraumatic): Secondary | ICD-10-CM | POA: Diagnosis not present

## 2021-04-11 DIAGNOSIS — N170 Acute kidney failure with tubular necrosis: Secondary | ICD-10-CM | POA: Diagnosis not present

## 2021-04-11 DIAGNOSIS — N179 Acute kidney failure, unspecified: Secondary | ICD-10-CM | POA: Diagnosis not present

## 2021-04-11 LAB — CBC
HCT: 31.2 % — ABNORMAL LOW (ref 36.0–46.0)
Hemoglobin: 10.1 g/dL — ABNORMAL LOW (ref 12.0–15.0)
MCH: 28.1 pg (ref 26.0–34.0)
MCHC: 32.4 g/dL (ref 30.0–36.0)
MCV: 86.9 fL (ref 80.0–100.0)
Platelets: 97 10*3/uL — ABNORMAL LOW (ref 150–400)
RBC: 3.59 MIL/uL — ABNORMAL LOW (ref 3.87–5.11)
RDW: 19.2 % — ABNORMAL HIGH (ref 11.5–15.5)
WBC: 38.9 10*3/uL — ABNORMAL HIGH (ref 4.0–10.5)
nRBC: 0.3 % — ABNORMAL HIGH (ref 0.0–0.2)

## 2021-04-11 LAB — RENAL FUNCTION PANEL
Albumin: 1.8 g/dL — ABNORMAL LOW (ref 3.5–5.0)
Albumin: 1.8 g/dL — ABNORMAL LOW (ref 3.5–5.0)
Anion gap: 6 (ref 5–15)
Anion gap: 6 (ref 5–15)
BUN: 17 mg/dL (ref 8–23)
BUN: 14 mg/dL (ref 8–23)
CO2: 28 mmol/L (ref 22–32)
CO2: 27 mmol/L (ref 22–32)
Calcium: 7.3 mg/dL — ABNORMAL LOW (ref 8.9–10.3)
Calcium: 7.1 mg/dL — ABNORMAL LOW (ref 8.9–10.3)
Chloride: 101 mmol/L (ref 98–111)
Chloride: 102 mmol/L (ref 98–111)
Creatinine, Ser: 0.78 mg/dL (ref 0.44–1.00)
Creatinine, Ser: 0.69 mg/dL (ref 0.44–1.00)
GFR, Estimated: >60 mL/min (ref >60)
GFR, Estimated: >60 mL/min (ref >60)
Glucose, Bld: 210 mg/dL — ABNORMAL HIGH (ref 70–99)
Glucose, Bld: 229 mg/dL — ABNORMAL HIGH (ref 70–99)
Phosphorus: 2.2 mg/dL — ABNORMAL LOW (ref 2.5–4.6)
Phosphorus: 2.6 mg/dL (ref 2.5–4.6)
Potassium: 3.9 mmol/L (ref 3.5–5.1)
Potassium: 3.8 mmol/L (ref 3.5–5.1)
Sodium: 135 mmol/L (ref 135–145)
Sodium: 135 mmol/L (ref 135–145)

## 2021-04-11 LAB — COMPREHENSIVE METABOLIC PANEL
ALT: 19 U/L (ref 0–44)
AST: 16 U/L (ref 15–41)
Albumin: 1.7 g/dL — ABNORMAL LOW (ref 3.5–5.0)
Alkaline Phosphatase: 63 U/L (ref 38–126)
Anion gap: 4 — ABNORMAL LOW (ref 5–15)
BUN: 17 mg/dL (ref 8–23)
CO2: 29 mmol/L (ref 22–32)
Calcium: 7.4 mg/dL — ABNORMAL LOW (ref 8.9–10.3)
Chloride: 101 mmol/L (ref 98–111)
Creatinine, Ser: 0.78 mg/dL (ref 0.44–1.00)
GFR, Estimated: >60 mL/min (ref >60)
Glucose, Bld: 211 mg/dL — ABNORMAL HIGH (ref 70–99)
Potassium: 3.9 mmol/L (ref 3.5–5.1)
Sodium: 134 mmol/L — ABNORMAL LOW (ref 135–145)
Total Bilirubin: 0.7 mg/dL (ref 0.3–1.2)
Total Protein: 4.5 g/dL — ABNORMAL LOW (ref 6.5–8.1)

## 2021-04-11 LAB — SURGICAL PATHOLOGY

## 2021-04-11 LAB — MAGNESIUM: Magnesium: 2.3 mg/dL (ref 1.7–2.4)

## 2021-04-11 LAB — GLUCOSE, CAPILLARY
Glucose-Capillary: 142 mg/dL — ABNORMAL HIGH (ref 70–99)
Glucose-Capillary: 72 mg/dL (ref 70–99)
Glucose-Capillary: 107 mg/dL — ABNORMAL HIGH (ref 70–99)
Glucose-Capillary: 213 mg/dL — ABNORMAL HIGH (ref 70–99)
Glucose-Capillary: 114 mg/dL — ABNORMAL HIGH (ref 70–99)
Glucose-Capillary: 174 mg/dL — ABNORMAL HIGH (ref 70–99)

## 2021-04-11 LAB — TRIGLYCERIDES: Triglycerides: 102 mg/dL (ref <150)

## 2021-04-11 LAB — PHOSPHORUS: Phosphorus: 2.2 mg/dL — ABNORMAL LOW (ref 2.5–4.6)

## 2021-04-11 MED ORDER — SODIUM CHLORIDE 0.9 % IV BOLUS
500.0000 mL | Freq: Once | INTRAVENOUS | Status: AC
  Administered 2021-04-11: 500 mL via INTRAVENOUS

## 2021-04-11 MED ORDER — LACTATED RINGERS IV BOLUS
1000.0000 mL | Freq: Once | INTRAVENOUS | Status: AC
  Administered 2021-04-11: 1000 mL via INTRAVENOUS

## 2021-04-11 MED ORDER — SODIUM PHOSPHATES 45 MMOLE/15ML IV SOLN
30.0000 mmol | Freq: Once | INTRAVENOUS | Status: AC
  Administered 2021-04-11: 30 mmol via INTRAVENOUS
  Filled 2021-04-11: qty 10

## 2021-04-11 MED ORDER — ALBUMIN HUMAN 25 % IV SOLN
12.5000 g | Freq: Every day | INTRAVENOUS | Status: DC
  Administered 2021-04-11 – 2021-04-12 (×2): 12.5 g via INTRAVENOUS
  Filled 2021-04-11 (×2): qty 50

## 2021-04-11 MED ORDER — TRAVASOL 10 % IV SOLN
INTRAVENOUS | Status: AC
  Filled 2021-04-11: qty 1056

## 2021-04-11 NOTE — Progress Notes (Signed)
NAME:  Lindsay [name], MRN:  [id number], DOB:  06/24/1946, LOS: 4 ADMISSION DATE:  04/19/2021    CC follow up severe resp failure and shock  HPI 75 year old lifelong never smoker with a history as noted below, who presented to [facility] today after a 5-day history of constant sharp and severe bilateral lower abdominal pain associated with daily episodes of nonbloody and nonbilious emesis.  The patient had not been able to take significant p.o. intake for approximately 5 days.  She endorsed to the emergency room physician that she had not made urine in 4 days PTA.  She presented to [facility] in the early hours of the morning feeling that she was in a pass out and feeling extremely weak.  She did not endorse any shortness of breath fevers or chills.  She stated that she was having bowel movements.  No constipation or diarrhea.  On evaluation at the emergency room the patient was noted to be tachypneic and described as being pale and sick appearing.  She was noted to have diffusely tender abdomen particular in the lower quadrants but described not to be having any rebound.  A CT abdomen and pelvis was obtained and this showed bowel perforation with marked inflammatory changes and severe pneumoperitoneum throughout the abdomen and pelvis.  Bowel ischemia could not be excluded.  Emergent surgical consultation was recommended.   This point Dr. Fredirick Maudlin was consulted emergently and the patient went emergently to the operating room.  Dr. Celine Ahr performed an exploratory laparotomy, mobilization of the splenic flexure, extensive adhesion lysis, low anterior resection, descending colostomy, left salpingo-oophorectomy and placement of a wound VAC.  Dr. Celine Ahr described that all upon entering the abdomen roughly 1.5 L of pus were released.  There was no obvious feculent drainage.  During the procedure a small splenic laceration occur during the exploration which was managed with topical hemostatic agents.  Patient also  had placement of multiple drains in the abdomen.  The patient had central line arterial line placed prior to surgery.  Patient received appropriate volume resuscitation with crystalloid and colloid and was transferred to the intensive care unit after the procedure and mechanically ventilated status.  She was at the time requiring pressors in the form of norepinephrine.  At that point PCCM was consulted for management of the patient's critical illness.   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  She has a history of A. fib and is on amiodarone and anticoagulation with Xarelto.   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 Fib Diverticulitis   Significant Hospital Events: Including procedures, antibiotic start and stop dates in addition to other pertinent events  04/13/2021 admitted post emergent ex lap for perforated viscus, peritonitis, severe sepsis 05/03/2021 piperacillin tazobactam and Eraxis initiated 7/4 remains critically ill, severe multiorgan failure 7/5 remains critically ill, severe shock, on CRRT 7/6 remains on vent, pressors and CRRT 7/7 remains on CRRT, on pressors, on vent        Interim History / Subjective:  Severe shock On CRRT On pressors +multiorgan failure   Antimicrobials:   Antibiotics Given (last 72 hours)     Date/Time Action Medication Dose Rate   04/08/21 1334 New Bag/Given   piperacillin-tazobactam (ZOSYN) IVPB 2.25 g 2.25 g 100 mL/hr   04/08/21 2005 New Bag/Given   piperacillin-tazobactam (ZOSYN) IVPB 3.375 g 3.375 g 100 mL/hr   04/09/21 0246 New  Bag/Given   piperacillin-tazobactam (ZOSYN) IVPB 3.375 g 3.375 g 100 mL/hr   04/09/21 0754 New Bag/Given   piperacillin-tazobactam (ZOSYN) IVPB 3.375 g 3.375 g 100 mL/hr   04/09/21 1317 New Bag/Given   piperacillin-tazobactam (ZOSYN) IVPB 3.375 g 3.375 g 100 mL/hr   04/09/21 2016 New  Bag/Given   piperacillin-tazobactam (ZOSYN) IVPB 3.375 g 3.375 g 100 mL/hr   04/10/21 0220 New Bag/Given   piperacillin-tazobactam (ZOSYN) IVPB 3.375 g 3.375 g 100 mL/hr   04/10/21 0734 New Bag/Given   piperacillin-tazobactam (ZOSYN) IVPB 3.375 g 3.375 g 100 mL/hr   04/10/21 1429 New Bag/Given   piperacillin-tazobactam (ZOSYN) IVPB 3.375 g 3.375 g 100 mL/hr   04/10/21 1931 New Bag/Given   piperacillin-tazobactam (ZOSYN) IVPB 3.375 g 3.375 g 100 mL/hr   04/11/21 0101 New Bag/Given   piperacillin-tazobactam (ZOSYN) IVPB 3.375 g 3.375 g 100 mL/hr               Objective   Blood pressure 110/81, pulse (!) 123, temperature (!) 94.64 F (34.8 C), temperature source Rectal, resp. rate (!) 21, height 5\' 5"  (1.651 m), weight 75.2 kg, SpO2 96 %.    Vent Mode: PRVC FiO2 (%):  [24 %] 24 % Set Rate:  [20 bmp] 20 bmp Vt Set:  [450 mL] 450 mL PEEP:  [5 cmH20] 5 cmH20   Intake/Output Summary (Last 24 hours) at 04/11/2021 0718 Last data filed at 04/11/2021 0700 Gross per 24 hour  Intake 4009.55 ml  Output 3914 ml  Net 95.55 ml   Filed Weights   05/03/2021 0310 04/10/21 0333 04/11/21 0320  Weight: 63.5 kg 78.6 kg 75.2 kg      REVIEW OF SYSTEMS  PATIENT IS UNABLE TO PROVIDE COMPLETE REVIEW OF SYSTEMS DUE TO SEVERE CRITICAL ILLNESS AND TOXIC METABOLIC ENCEPHALOPATHY   PHYSICAL EXAMINATION:  GENERAL:critically ill appearing, +resp distress HEAD: Normocephalic, atraumatic.  EYES: Pupils equal, round, reactive to light.  No scleral icterus.  MOUTH: Moist mucosal membrane. NECK: Supple. PULMONARY: +rhonchi, +wheezing CARDIOVASCULAR: S1 and S2. Regular rate and rhythm. No murmurs, rubs, or gallops.  GASTROINTESTINAL:distended +drains MUSCULOSKELETAL: No swelling, clubbing, or edema.  NEUROLOGIC: obtunded SKIN:intact,warm,dry   Labs/imaging that I havepersonally reviewed  (right click and "Reselect all SmartList Selections" daily)       ASSESSMENT AND PLAN SYNOPSIS   75  yo female with severe bowel perforation SEPSIS PRESENT ON ADMISSION and perforated ABD with ABD abscess with severe septic shock s/p ex lap with progressive multiorgan failure  Severe ACUTE Hypoxic and Hypercapnic Respiratory Failure -continue Mechanical Ventilator support -continue Bronchodilator Therapy -Wean Fio2 and PEEP as tolerated -VAP/VENT bundle implementation Unable to wean from vent, shock  Vent Mode: PRVC FiO2 (%):  [24 %] 24 % Set Rate:  [20 bmp] 20 bmp Vt Set:  [450 mL] 450 mL PEEP:  [5 cmH20] 5 cmH20   CARDIAC FAILURE-acute MAT, Afib -oxygen as needed -Lasix as tolerated follow up cardiac enzymes as indicated On AMIO Follow up cardiology recs   CARDIAC ICU monitoring   ACUTE KIDNEY INJURY/Renal Failure -continue Foley Catheter-assess need -Avoid nephrotoxic agents -Follow urine output, BMP -Ensure adequate renal perfusion, optimize oxygenation -Renal dose medications On CRRT   Intake/Output Summary (Last 24 hours) at 04/11/2021 0718 Last data filed at 04/11/2021 0700 Gross per 24 hour  Intake 4009.55 ml  Output 3914 ml  Net 95.55 ml     NEUROLOGY Acute toxic metabolic encephalopathy, need for sedation Goal RASS -2 to -3   SEPTIC SHOCK SOURCE-ABD bowel perf -  use vasopressors to keep MAP>65 as needed -follow ABG and LA -follow up cultures -emperic ABX -consider stress dose steroids -aggressive IV fluid resuscitation  INFECTIOUS DISEASE Peritonitis with severe sepsis Bowel perforation Severe sepsis -continue antibiotics as prescribed -follow up cultures -follow up ID consultation  ENDO - ICU hypoglycemic\Hyperglycemia protocol -check FSBS per protocol   GI GI PROPHYLAXIS as indicated  NUTRITIONAL STATUS DIET-->TPN Constipation protocol as indicated   ELECTROLYTES -follow labs as needed -replace as needed -pharmacy consultation and following   Best practice (right click and "Reselect all SmartList Selections" daily)  Diet:   NPO Pain/Anxiety/Delirium protocol (if indicated): Yes (RASS goal -2) VAP protocol (if indicated): Yes DVT prophylaxis: Subcutaneous Heparin GI prophylaxis: PPI Glucose control:  SSI Yes Central venous access:  Yes, and it is still needed Arterial line:  Yes, and it is still needed Foley:  Yes, and it is still needed Mobility:  bed rest  PT consulted: N/A Code Status:  DNR Disposition: ICU   Labs   CBC: Recent Labs  Lab 04/17/2021 1256 04/27/2021 1651 04/08/21 0307 04/09/21 0521 04/10/21 0312 04/11/21 0404  WBC 14.9*  --  21.3* 19.3* 21.8* 38.9*  NEUTROABS 14.5*  --   --  18.5* 20.4*  --   HGB 6.7* 10.5* 9.0* 8.3* 8.0* 10.1*  HCT 21.3* 31.3* 26.5* 24.2* 23.8* 31.2*  MCV 79.8*  --  81.8 82.9 83.2 86.9  PLT 266  --  167 131* 120* 97*    Basic Metabolic Panel: Recent Labs  Lab 04/16/2021 1256 04/08/21 0307 04/08/21 1650 04/09/21 0521 04/09/21 1601 04/10/21 0312 04/10/21 1500 04/11/21 0404  NA 132* 132*   < > 137  135 136 134* 135 134*  135  K 4.3 3.8   < > 3.9  3.8 3.8 3.6 3.4* 3.9  3.9  CL 102 101   < > 103  102 102 104 104 101  101  CO2 19* 19*   < > 27  27 26 27 26 29  28   GLUCOSE 152* 233*   < > 233*  238* 218* 237* 266* 211*  210*  BUN 70* 71*   < > 40*  39* 25* 20 18 17  17   CREATININE 3.95* 4.24*   < > 2.18*  2.33* 1.48* 1.09* 0.93 0.78  0.78  CALCIUM 7.5* 7.2*   < > 7.3*  7.2* 7.5* 7.3* 7.2* 7.4*  7.3*  MG 2.0 1.9  --  2.3  --  2.3  --  2.3  PHOS  --  7.4*   < > 3.3  3.3 1.9* 1.6* 2.7 2.2*  2.2*   < > = values in this interval not displayed.   GFR: Estimated Creatinine Clearance: 61.7 mL/min (by C-G formula based on SCr of 0.78 mg/dL). Recent Labs  Lab 04/19/2021 0726 04/13/2021 1256 04/08/21 0307 04/09/21 0521 04/09/21 1124 04/10/21 0312 04/11/21 0404  PROCALCITON  --  15.49 22.12 11.60  --   --   --   WBC  --  14.9* 21.3* 19.3*  --  21.8* 38.9*  LATICACIDVEN 2.9* 2.9* 1.4  --  1.3  --   --     Liver Function Tests: Recent Labs   Lab 04/29/2021 0348 04/08/2021 1256 04/08/21 0307 04/09/21 0521 04/09/21 1601 04/10/21 0312 04/10/21 1500 04/11/21 0404  AST 56* 42*  --  38  --   --   --  16  ALT 41 21  --  31  --   --   --  19  ALKPHOS 70 28*  --  44  --   --   --  63  BILITOT 0.9 1.4*  --  1.3*  --   --   --  0.7  PROT 6.6 3.8*  --  4.3*  --   --   --  4.5*  ALBUMIN 2.6* 2.3*   < > 2.0*  2.0* 1.9* 1.8* 1.7* 1.7*  1.8*   < > = values in this interval not displayed.   Recent Labs  Lab 04/14/2021 0348  LIPASE 41   No results for input(s): AMMONIA in the last 168 hours.  ABG    Component Value Date/Time   PHART 7.38 04/09/2021 0341   PCO2ART 40 04/09/2021 0341   PO2ART 115 (H) 04/09/2021 0341   HCO3 23.7 04/09/2021 0341   ACIDBASEDEF 1.3 04/09/2021 0341   O2SAT 98.4 04/09/2021 0341     Coagulation Profile: Recent Labs  Lab 05/02/2021 1256  INR 3.3*    Cardiac Enzymes: No results for input(s): CKTOTAL, CKMB, CKMBINDEX, TROPONINI in the last 168 hours.  HbA1C: Hgb A1c MFr Bld  Date/Time Value Ref Range Status  05/01/2021 04:51 PM 5.7 (H) 4.8 - 5.6 % Final    Comment:    (NOTE)         Prediabetes: 5.7 - 6.4         Diabetes: >6.4         Glycemic control for adults with diabetes: <7.0     CBG: Recent Labs  Lab 04/10/21 1118 04/10/21 1517 04/10/21 1908 04/10/21 2323 04/11/21 0323  GLUCAP 209* 231* 203* 140* 142*    Allergies Allergies  Allergen Reactions   Amiodarone     Other reaction(s): Other (See Comments) Nausea, diarrhea, weight loss, anxiety, jitteriness   Levofloxacin Rash    Red palms, lips, forearms    Meloxicam Shortness Of Breath    Other reaction(s): Respiratory Distress   Amlodipine     Other reaction(s): Other (See Comments) Caused severe gingival hyperplasia   Amlodipine Besy-Benazepril Hcl     Severe gingival hyperplasia  Other reaction(s): Other (See Comments) Other reaction(s): Other   Beta Adrenergic Blockers     States severe fatigue  Other  reaction(s): Other (See Comments) fatigue       DVT/GI PRX  assessed I Assessed the need for Labs I Assessed the need for Foley I Assessed the need for Central Venous Line Family Discussion when available I Assessed the need for Mobilization I made an Assessment of medications to be adjusted accordingly Safety Risk assessment completed  CASE DISCUSSED IN MULTIDISCIPLINARY ROUNDS WITH ICU TEAM     Critical Care Time devoted to patient care services described in this note is 55 minutes.  Critical care was necessary to treat or prevent imminent or life-threatening deterioration.   PATIENT WITH VERY POOR PROGNOSIS I ANTICIPATE PROLONGED ICU LOS  Patient with Multiorgan failure and at high risk for cardiac arrest and death.    Corrin Parker, M.D.  [facility]  Medical Director [place] Director [facility] Cardio-Pulmonary Department

## 2021-04-11 NOTE — Progress Notes (Signed)
[place] Hospital Day(s): 4.   Post op day(s): 4 Days Post-Op.   Interval History:  Patient seen and examined Patient remains intubated and sedated Tachycardic; in atrial fibrillation; Amiodarone at 30 ml/hr Vasopressors: Levophed at 12 mcg/min and 0.04 units vasopressin restarted Leukocytosis up again this morning to 38.9K Hgb stable up to 10.1 Renal function making improvements; sCr - 0.78; UO - 15 ccs On CRRT Mild hypophosphatemia to 2.2 o/w no electrolyte derangements NGT with 120 ccs out Surgical drains             - RUQ: 60 ccs out; serosanguinous             - RLQ: 40 ccs out; serosanguinous Colostomy retracted, serosanguinous output  On TPN On Zosyn   Vital signs in last 24 hours: [min-max] current  Temp:  [93.02 F (33.9 C)-94.64 F (34.8 C)] 94.64 F (34.8 C) (07/07 0400) Pulse Rate:  [50-127] 123 (07/07 0400) Resp:  [20-21] 21 (07/07 0400) BP: (81-147)/(49-92) 110/81 (07/07 0400) SpO2:  [96 %-100 %] 96 % (07/07 0518) Arterial Line BP: (85-205)/(32-68) 136/53 (07/07 0400) FiO2 (%):  [24 %] 24 % (07/07 0518) Weight:  [75.2 kg] 75.2 kg (07/07 0320)     Height: 5\' 5"  (165.1 cm) Weight: 75.2 kg BMI (Calculated): 27.59   Intake/Output last 2 shifts:  07/06 0701 - 07/07 0700 In: 4009.6 [I.V.:3292.3; IV Piggyback:633.9] Out: 2836 [Urine:15; Emesis/NG output:120; Drains:100]   Physical Exam:  Constitutional: Intubated; Sedated HEENT: NGT in place  Respiratory: on ventilator  Cardiovascular: irregularly irregular; tachycardic Gastrointestinal: Soft, unable to assess tenderness; non-distended, no rebound/guarding. 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; patent; retracted, serosanguinous fluid in bag, Surgical drains in RUQ and RLQ with serosanguinous output Genitourinary: Foley in place  Integumentary: Midline wound, measuring 21 x 6 x 3 cm, Wound vac in place    Labs:  CBC Latest Ref Rng & Units 04/11/2021 04/10/2021 04/09/2021   WBC 4.0 - 10.5 K/uL 38.9(H) 21.8(H) 19.3(H)  Hemoglobin 12.0 - 15.0 g/dL 10.1(L) 8.0(L) 8.3(L)  Hematocrit 36.0 - 46.0 % 31.2(L) 23.8(L) 24.2(L)  Platelets 150 - 400 K/uL 97(L) 120(L) 131(L)   CMP Latest Ref Rng & Units 04/11/2021 04/11/2021 04/10/2021  Glucose 70 - 99 mg/dL 211(H) 210(H) 266(H)  BUN 8 - 23 mg/dL 17 17 18   Creatinine 0.44 - 1.00 mg/dL 0.78 0.78 0.93  Sodium 135 - 145 mmol/L 134(L) 135 135  Potassium 3.5 - 5.1 mmol/L 3.9 3.9 3.4(L)  Chloride 98 - 111 mmol/L 101 101 104  CO2 22 - 32 mmol/L 29 28 26   Calcium 8.9 - 10.3 mg/dL 7.4(L) 7.3(L) 7.2(L)  Total Protein 6.5 - 8.1 g/dL 4.5(L) - -  Total Bilirubin 0.3 - 1.2 mg/dL 0.7 - -  Alkaline Phos 38 - 126 U/L 63 - -  AST 15 - 41 U/L 16 - -  ALT 0 - 44 U/L 19 - -     Imaging studies: No new pertinent imaging studies   Assessment/Plan:  75 y.o. critically ill female equiring vasopressor and ventilator support 4 Days Post-Op s/p exploratory laparotomy, hartman's procedure, salpingo-oopherectomy, and placement of wound vac for perforated diverticulitis with frankly purulent peritonitis   - Unfortunately, I do believe she I starting to deteriorate this morning given her increased need for vasopressor support concomitant with her significant increase in leukocytosis to 38.9K and over the last 48 hours as well. I would recommend repeat imaging to ensure no missed intra-abdominal processes; however, I do not think  she is stable enough for imaging given her need for significant support and CRRT. At this point, I do think involving palliative care would be pertinent. She is critically ill and prognosis is overall grave with likely little chance of meaningful recovery. We do not have much more to offer from a surgical perspective at this time.    - Appreciate PCCM assistance; ventilator and vasopressor support pre their service - Appreciate nephrology assistance; CRRT - Continue TPN; NPO + IVF support - Continue IV Abx (Zosyn) - Monitor  abdominal examination; on-going colostomy function              - Midline wound care: Wound vac; MWF schedule --> I will ask WOC RN to assist with wound vac and ostomy care             - Pain control prn - Monitor leukocytosis; worsening             - Monitor renal function; improved but without UO   All of the above findings and recommendations were discussed with the medical team   -- Edison Simon, PA-C [facility] 04/11/2021, 7:30 AM [phone] M-F: 7am - 4pm

## 2021-04-11 NOTE — Consult Note (Signed)
NAME: Lindsay [name]  DOB: 1946/06/01  MRN: [id number]  Date/Time: 04/11/2021 7:06 PM  REQUESTING PROVIDER: Dr.Kasa Subjective:  REASON FOR CONSULT: intraabdominal abscess  No history available from patient as she is intubated. Lindsay [name] is a 75 y.o. 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  She presented to the ED via EMS on 04/25/2021 with nausea weakness and rectal bleed and near syncope.  Patient had reported to the ED physician that she has had sharp pain over the lower abdomen for the past 5 days associated with episodes of nonbloody nonbilious emesis.  She had not made any urine in 4 days.  Was not able to keep anything down in 4 days.  Vitals revealed a BP of 118/67, temperature 98, sats 100%, respiratory 16.  Labs revealed a sodium of 128, BUN of 81, creatinine of 4.81, albumin 2.6, WBC 19, platelet 461.  CT abdomen revealed bowel perforation with marked inflammatory changes and pneumoperitoneum throughout the abdomen and pelvis.  Patient was taken to the OR by Dr. Celine Ahr and a laparotomy was done which showed extensive adhesion and underwent lower anterior resection with descending colostomy left salpingo-oophorectomy and placement of wound VAC.  1.5 L of pus was drained in the abdomen but no cultures were sent. Patient is intubated and on CRRT and on pressors.  She is currently on Zosyn and Eraxis.  I am asked to see the patient for further antibiotic recommendation.   Past Medical History:  Diagnosis Date   Diabetes mellitus without complication ([place])    Hypertension     Past Surgical History:  Procedure Laterality Date   APPLICATION OF WOUND VAC N/A 05/05/2021   Procedure:  APPLICATION OF WOUND VAC;  Surgeon: Fredirick Maudlin, MD;  Location: [facility];  Service: General;  Laterality: N/A;  [id number]   BREAST BIOPSY Right    2019-benign   BREAST EXCISIONAL BIOPSY Right 2018   COLOSTOMY Left 05/02/2021   Procedure: COLOSTOMY;  Surgeon: Fredirick Maudlin, MD;  Location: [facility];  Service: General;  Laterality: Left;   LAPAROTOMY N/A 04/21/2021   Procedure: EXPLORATORY LAPAROTOMY WITH LOWER ANTERIOR RESECTION;  Surgeon: Fredirick Maudlin, MD;  Location: [facility];  Service: General;  Laterality: N/A;   LYSIS OF ADHESION N/A 05/01/2021   Procedure: LYSIS OF ADHESION;  Surgeon: Fredirick Maudlin, MD;  Location: [facility];  Service: General;  Laterality: N/A;   REDUCTION MAMMAPLASTY Bilateral    2010   SALPINGOOPHORECTOMY Left 05/01/2021   Procedure: OPEN SALPINGO OOPHORECTOMY;  Surgeon: Fredirick Maudlin, MD;  Location: [facility];  Service: General;  Laterality: Left;    Social History   Socioeconomic History   Marital status: Married    Spouse name: Not on file   Number of children: Not on file   Years of education: Not on file   Highest education level: Not on file  Occupational History   Not on file  Tobacco Use   Smoking status: Never   Smokeless tobacco: Never  Substance and Sexual Activity   Alcohol use: Never   Drug use: Never   Sexual activity: Not Currently  Other Topics Concern   Not on file  Social History Narrative   Not on file  Social Determinants of Health   Financial Resource Strain: Not on file  Food Insecurity: Not on file  Transportation Needs: Not on file  Physical Activity: Not on file  Stress: Not on file  Social Connections: Not on file  Intimate Partner Violence: Not on file    Family History  Problem Relation Age of Onset   Breast cancer Mother    Allergies  Allergen Reactions   Amiodarone     Other reaction(s): Other (See Comments) Nausea, diarrhea, weight loss, anxiety, jitteriness   Levofloxacin Rash    Red palms, lips,  forearms    Meloxicam Shortness Of Breath    Other reaction(s): Respiratory Distress   Amlodipine     Other reaction(s): Other (See Comments) Caused severe gingival hyperplasia   Amlodipine Besy-Benazepril Hcl     Severe gingival hyperplasia  Other reaction(s): Other (See Comments) Other reaction(s): Other   Beta Adrenergic Blockers     States severe fatigue  Other reaction(s): Other (See Comments) fatigue   I? Current Facility-Administered Medications  Medication Dose Route Frequency Provider Last Rate Last Admin    prismasol BGK 4/2.5 infusion   CRRT Continuous Lateef, Munsoor, MD 500 mL/hr at 04/11/21 1627 New Bag at 04/11/21 1627    prismasol BGK 4/2.5 infusion   CRRT Continuous Lateef, Munsoor, MD 500 mL/hr at 04/11/21 1627 New Bag at 04/11/21 1627   albumin human 25 % solution 12.5 g  12.5 g Intravenous Daily Flora Lipps, MD   Stopped at 04/11/21 1550   albuterol (PROVENTIL) (2.5 MG/3ML) 0.083% nebulizer solution 2.5 mg  2.5 mg Nebulization Q6H Tyler Pita, MD   2.5 mg at 04/11/21 1312   amiodarone (NEXTERONE PREMIX) 360-4.14 MG/200ML-% (1.8 mg/mL) IV infusion  30 mg/hr Intravenous Continuous Teodoro Spray, MD 16.7 mL/hr at 04/11/21 1200 30.06 mg/hr at 04/11/21 1200   anidulafungin (ERAXIS) 100 mg in sodium chloride 0.9 % 100 mL IVPB  100 mg Intravenous Q24H Dallie Piles, [facility]   Stopped at 04/11/21 1652   chlorhexidine gluconate (MEDLINE KIT) (PERIDEX) 0.12 % solution 15 mL  15 mL Mouth Rinse BID Tyler Pita, MD   15 mL at 04/11/21 0755   Chlorhexidine Gluconate Cloth 2 % PADS 6 each  6 each Topical Q0600 Flora Lipps, MD   6 each at 04/11/21 1628   heparin sodium (porcine) injection 1,000-6,000 Units  1,000-6,000 Units Intracatheter PRN Anthonette Legato, MD   3,600 Units at 04/10/21 2056   HYDROmorphone (DILAUDID) 50 mg in sodium chloride 0.9 % 100 mL (0.5 mg/mL) infusion  0.5-4 mg/hr Intravenous Continuous Flora Lipps, MD 8 mL/hr at 04/11/21 1800 4 mg/hr at  04/11/21 1800   HYDROmorphone (DILAUDID) bolus via infusion 0.25-1 mg  0.25-1 mg Intravenous Q30 min PRN Flora Lipps, MD   1 mg at 04/11/21 1241   HYDROmorphone (DILAUDID) injection 0.5 mg  0.5 mg Intravenous Once Flora Lipps, MD       insulin aspart (novoLOG) injection 0-20 Units  0-20 Units Subcutaneous Q4H Flora Lipps, MD   7 Units at 04/11/21 1628   insulin aspart (novoLOG) injection 2 Units  2 Units Subcutaneous Q4H Flora Lipps, MD   2 Units at 04/11/21 1628   MEDLINE mouth rinse  15 mL Mouth Rinse 10 times per day Tyler Pita, MD   15 mL at 04/11/21 1805   midazolam (VERSED) injection 2-4 mg  2-4 mg Intravenous Q30 min PRN Flora Lipps, MD   2 mg at 04/11/21 0859   norepinephrine (LEVOPHED)  57m in 2578mpremix infusion  0-40 mcg/min Intravenous Titrated KeDarel Hong, NP 37.5 mL/hr at 04/11/21 1800 10 mcg/min at 04/11/21 1800   pantoprazole (PROTONIX) injection 40 mg  40 mg Intravenous Daily GoTyler PitaMD   40 mg at 04/11/21 0859   piperacillin-tazobactam (ZOSYN) IVPB 3.375 g  3.375 g Intravenous Q6H HiDarnelle BosRPH   Stopped at 04/11/21 1443   polyvinyl alcohol (LIQUIFILM TEARS) 1.4 % ophthalmic solution 1 drop  1 drop Both Eyes PRN KaFlora LippsMD   1 drop at 04/11/21 1239   prismasol BGK 4/2.5 infusion   CRRT Continuous Lateef, Munsoor, MD 2,000 mL/hr at 04/11/21 1627 New Bag at 04/11/21 1627   propofol (DIPRIVAN) 1000 MG/100ML infusion  5-80 mcg/kg/min Intravenous Titrated GoTyler PitaMD 9.53 mL/hr at 04/11/21 1800 25 mcg/kg/min at 04/11/21 1800   TPN ADULT (ION)   Intravenous Continuous TPN KaFlora LippsMD 80 mL/hr at 04/11/21 1804 New Bag at 04/11/21 1804   vasopressin (PITRESSIN) 20 Units in sodium chloride 0.9 % 100 mL infusion-*FOR SHOCK*  0-0.04 Units/min Intravenous Continuous Rust-Chester, Britton L, NP 12 mL/hr at 04/11/21 1800 0.04 Units/min at 04/11/21 1800     Abtx:  Anti-infectives (From admission, onward)    Start     Dose/Rate Route  Frequency Ordered Stop   04/08/21 2000  piperacillin-tazobactam (ZOSYN) IVPB 3.375 g        3.375 g 100 mL/hr over 30 Minutes Intravenous Every 6 hours 04/08/21 1803     04/08/21 1400  anidulafungin (ERAXIS) 100 mg in sodium chloride 0.9 % 100 mL IVPB        100 mg 78 mL/hr over 100 Minutes Intravenous Every 24 hours 05/05/2021 1317     04/23/2021 1500  anidulafungin (ERAXIS) 200 mg in sodium chloride 0.9 % 200 mL IVPB        200 mg 78 mL/hr over 200 Minutes Intravenous  Once 04/27/2021 1317 04/21/2021 1820   04/25/2021 1400  piperacillin-tazobactam (ZOSYN) IVPB 2.25 g  Status:  Discontinued        2.25 g 100 mL/hr over 30 Minutes Intravenous Every 8 hours 04/17/2021 1311 04/08/21 1803   04/13/2021 0530  piperacillin-tazobactam (ZOSYN) IVPB 3.375 g        3.375 g 100 mL/hr over 30 Minutes Intravenous  Once 04/06/2021 0519 04/17/2021 0558       REVIEW OF SYSTEMS:  NA VITALS:  BP (!) 88/53   Pulse (!) 123   Temp (!) 93.92 F (34.4 C) (Rectal)   Resp 20   Ht '5\' 5"'  (1.651 m)   Wt 75.2 kg   SpO2 98%   BMI 27.59 kg/m  PHYSICAL EXAM:  General: Intubated, sedated and on pressors.  Head: Normocephalic, without obvious abnormality, atraumatic. Eyes: Conjunctivae clear, anicteric sclerae. Pupils are equal ENT N cannot be assessed Neck: Supple, right IJ r no carotid bruit and no JVD. Back: Did not assess Lungs: Bilateral air entry.  Decreased in the bases Heart: Tachycardia ICD in place. Abdomen: Soft, wound VAC covering the surgical incision site 2 JP drains Colostomy Extremities: In bunny boots. Skin: No rashes or lesions. Or bruising Lymph: Cervical, supraclavicular normal. Neurologic: Cannot be assessed Pertinent Labs Lab Results CBC    Component Value Date/Time   WBC 38.9 (H) 04/11/2021 0404   RBC 3.59 (L) 04/11/2021 0404   HGB 10.1 (L) 04/11/2021 0404   HCT 31.2 (L) 04/11/2021 0404   PLT 97 (L) 04/11/2021 0404   MCV 86.9 04/11/2021  0404   MCH 28.1 04/11/2021 0404   MCHC 32.4  04/11/2021 0404   RDW 19.2 (H) 04/11/2021 0404   LYMPHSABS 0.2 (L) 04/10/2021 0312   MONOABS 0.2 04/10/2021 0312   EOSABS 0.0 04/10/2021 0312   BASOSABS 0.0 04/10/2021 0312    CMP Latest Ref Rng & Units 04/11/2021 04/11/2021 04/11/2021  Glucose 70 - 99 mg/dL 229(H) 211(H) 210(H)  BUN 8 - 23 mg/dL '14 17 17  ' Creatinine 0.44 - 1.00 mg/dL 0.69 0.78 0.78  Sodium 135 - 145 mmol/L 135 134(L) 135  Potassium 3.5 - 5.1 mmol/L 3.8 3.9 3.9  Chloride 98 - 111 mmol/L 102 101 101  CO2 22 - 32 mmol/L '27 29 28  ' Calcium 8.9 - 10.3 mg/dL 7.1(L) 7.4(L) 7.3(L)  Total Protein 6.5 - 8.1 g/dL - 4.5(L) -  Total Bilirubin 0.3 - 1.2 mg/dL - 0.7 -  Alkaline Phos 38 - 126 U/L - 63 -  AST 15 - 41 U/L - 16 -  ALT 0 - 44 U/L - 19 -      Microbiology: Recent Results (from the past 240 hour(s))  Resp Panel by RT-PCR (Flu A&B, Covid) Nasopharyngeal Swab     Status: None   Collection Time: 04/30/2021  4:40 AM   Specimen: Nasopharyngeal Swab; Nasopharyngeal(NP) swabs in vial transport medium  Result Value Ref Range Status   SARS Coronavirus 2 by RT PCR NEGATIVE NEGATIVE Final    Comment: (NOTE) SARS-CoV-2 target nucleic acids are NOT DETECTED.  The SARS-CoV-2 RNA is generally detectable in upper respiratory specimens during the acute phase of infection. The lowest concentration of SARS-CoV-2 viral copies this assay can detect is 138 copies/mL. A negative result does not preclude SARS-Cov-2 infection and should not be used as the sole basis for treatment or other patient management decisions. A negative result may occur with  improper specimen collection/handling, submission of specimen other than nasopharyngeal swab, presence of viral mutation(s) within the areas targeted by this assay, and inadequate number of viral copies(<138 copies/mL). A negative result must be combined with clinical observations, patient history, and epidemiological information. The expected result is Negative.  Fact Sheet for Patients:   EntrepreneurPulse.com.au  Fact Sheet for Healthcare Providers:  IncredibleEmployment.be  This test is no t yet approved or cleared by the [place] FDA and  has been authorized for detection and/or diagnosis of SARS-CoV-2 by FDA under an Emergency Use Authorization (EUA). This EUA will remain  in effect (meaning this test can be used) for the duration of the COVID-19 declaration under Section 564(b)(1) of the Act, 21 U.S.C.section 360bbb-3(b)(1), unless the authorization is terminated  or revoked sooner.       Influenza A by PCR NEGATIVE NEGATIVE Final   Influenza B by PCR NEGATIVE NEGATIVE Final    Comment: (NOTE) The Xpert Xpress SARS-CoV-2/FLU/RSV plus assay is intended as an aid in the diagnosis of influenza from Nasopharyngeal swab specimens and should not be used as a sole basis for treatment. Nasal washings and aspirates are unacceptable for Xpert Xpress SARS-CoV-2/FLU/RSV testing.  Fact Sheet for Patients: EntrepreneurPulse.com.au  Fact Sheet for Healthcare Providers: IncredibleEmployment.be  This test is not yet approved or cleared by the [place] FDA and has been authorized for detection and/or diagnosis of SARS-CoV-2 by FDA under an Emergency Use Authorization (EUA). This EUA will remain in effect (meaning this test can be used) for the duration of the COVID-19 declaration under Section 564(b)(1) of the Act, 21 U.S.C. section 360bbb-3(b)(1), unless the authorization is terminated  or revoked.  Performed at [facility], [address] [place], [place] [address]   Blood culture (routine x 2)     Status: None (Preliminary result)   Collection Time: 04/26/2021  5:33 AM   Specimen: BLOOD  Result Value Ref Range Status   Specimen Description BLOOD LEFT ANTECUBITAL  Final   Special Requests   Final    BOTTLES DRAWN AEROBIC AND ANAEROBIC Blood Culture adequate volume   Culture    Final    NO GROWTH 4 DAYS Performed at [facility], [address], [place], [place] [address]    Report Status PENDING  Incomplete  Blood culture (routine x 2)     Status: None (Preliminary result)   Collection Time: 05/03/2021  7:40 AM   Specimen: BLOOD  Result Value Ref Range Status   Specimen Description BLOOD BLOOD RIGHT WRIST  Final   Special Requests   Final    BOTTLES DRAWN AEROBIC AND ANAEROBIC Blood Culture adequate volume   Culture   Final    NO GROWTH 4 DAYS Performed at [facility], [address], [place], [place] [address]    Report Status PENDING  Incomplete  MRSA Next Gen by PCR, Nasal     Status: None   Collection Time: 04/19/2021 12:50 PM   Specimen: Nasal Mucosa; Nasal Swab  Result Value Ref Range Status   MRSA by PCR Next Gen NOT DETECTED NOT DETECTED Final    Comment: (NOTE) The GeneXpert MRSA Assay (FDA approved for NASAL specimens only), is one component of a comprehensive MRSA colonization surveillance program. It is not intended to diagnose MRSA infection nor to guide or monitor treatment for MRSA infections. Test performance is not FDA approved in patients less than 15 years old. Performed at [facility], [address] [place], [place] [address]   Body fluid culture w Gram Stain     Status: None (Preliminary result)   Collection Time: 04/10/21  4:30 PM   Specimen: JP Drain; Body Fluid  Result Value Ref Range Status   Specimen Description   Final    JP DRAINAGE Performed at [facility], [address] [place], [place] [address]    Special Requests   Final    Normal Performed at [facility], [address] [place], [place] [address]    Gram Stain   Final    ABUNDANT WBC PRESENT, PREDOMINANTLY PMN NO ORGANISMS SEEN    Culture   Final    NO GROWTH < 24 HOURS Performed at [facility] [address], [place], [place] [address]    Report Status PENDING  Incomplete    IMAGING  RESULTS: CT abdomen reviewed Pneumoperitoneum  I have personally reviewed the films ? Impression/Recommendation ? Perforated diverticular abscess with pneumoperitoneum and extensive infection in the intra-abdominal space causing mild abscesses.  Status post laparotomy with drainage of pus and Low anterior resection with descending colostomy and salpingooophorectomy on the left side.  The wound on the abdominal was closed close by a wound VAC.  She has 2 JP drains.  No cultures were sent during surgery.  But this morning JP drain culture has been sent.  Currently on Zosyn and Eraxis.  Eraxis can be changed to fluconazole as there is no history of taking fluconazole or having resistant Candida species.  Septic shock on pressors, antibiotics. ? __Acute kidney injury secondary to ATN from septic shock.  On CRRT Next metabolic acidosis next Acute respiratory failure  requiring intubation.  Hyponatremia being corrected _________________________________________________ Discussed with her care team. Note:  This document was prepared using Dragon voice recognition software and may include unintentional dictation errors.

## 2021-04-11 NOTE — Consult Note (Addendum)
WOC Nurse Consult Note: Reason for Consult: New colostomy and midline wound with NPWT Wound type:Surgical Pressure Injury POA: N/A Measurement: Per Surgery PA Z. Delena Bali on 04/10/21, 21cm x 6cm x 3cm  WOC Nurse to change NPWT dressing on Friday, 04/12/21 and begin ostomy care and education as appropriate thereafter.  Supplies ordered to bedside: 2 Large NPWT dressing kits, 4 1-piece easily compressible convex pouches, Kellie Simmering #[id number].    [place] nursing team will follow, and will remain available to this patient, the nursing, surgical and medical teams.  Thank you for this consultation. Maudie Flakes, MSN, RN, [place], Arther Abbott  Pager# [phone]

## 2021-04-11 NOTE — Progress Notes (Signed)
[facility]  ROUNDING NOTE   Subjective:  Patient remains critically ill requiring mechanical ventilation and multiple pressors. Still making very little urine at the moment. CRRT appears to be progressing relatively well. Patient has had hypotensive episode this a.m. after receiving a bit of sedation.   Objective:  Vital signs in last 24 hours:  Temp:  [93.02 F (33.9 C)-95 F (35 C)] 95 F (35 C) (07/07 0730) Pulse Rate:  [97-127] 123 (07/07 0400) Resp:  [20-21] 20 (07/07 0900) BP: (81-135)/(49-87) 101/79 (07/07 0900) SpO2:  [96 %-100 %] 99 % (07/07 0805) Arterial Line BP: (93-171)/(37-59) 127/54 (07/07 0830) FiO2 (%):  [24 %] 24 % (07/07 0805) Weight:  [75.2 kg] 75.2 kg (07/07 0320)  Weight change: -3.4 kg Filed Weights   04/06/2021 0310 04/10/21 0333 04/11/21 0320  Weight: 63.5 kg 78.6 kg 75.2 kg    Intake/Output: I/O last 3 completed shifts: In: 5606.4 [I.V.:4689.2; Other:233.3; IV Piggyback:683.9] Out: 2706 [Urine:15; Emesis/NG output:170; Drains:160; Other:5339]   Intake/Output this shift:  Total I/O In: 367.2 [I.V.:315.6; IV Piggyback:51.7] Out: 255 [Emesis/NG output:50; Drains:40; Other:165]  Physical Exam: General: Critically ill-appearing  Head: Normocephalic, atraumatic.  Endotracheal tube in place  Eyes: Anicteric  Neck: Supple  Lungs:  Clear to auscultation, vent assisted  Heart: S1S2 tachycardic  Abdomen:  Surgical wound noted, wound VAC in place  Extremities: Trace peripheral edema.  Neurologic: Intubated, sedated  Skin: No acute rash  Access: Left IJ temporary dialysis catheter    Basic Metabolic Panel: Recent Labs  Lab 04/06/2021 1256 04/08/21 0307 04/08/21 1650 04/09/21 0521 04/09/21 1601 04/10/21 0312 04/10/21 1500 04/11/21 0404  NA 132* 132*   < > 137  135 136 134* 135 134*  135  K 4.3 3.8   < > 3.9  3.8 3.8 3.6 3.4* 3.9  3.9  CL 102 101   < > 103  102 102 104 104 101  101  CO2 19* 19*   < > '27  27 26 27 26 29   28  ' GLUCOSE 152* 233*   < > 233*  238* 218* 237* 266* 211*  210*  BUN 70* 71*   < > 40*  39* 25* '20 18 17  17  ' CREATININE 3.95* 4.24*   < > 2.18*  2.33* 1.48* 1.09* 0.93 0.78  0.78  CALCIUM 7.5* 7.2*   < > 7.3*  7.2* 7.5* 7.3* 7.2* 7.4*  7.3*  MG 2.0 1.9  --  2.3  --  2.3  --  2.3  PHOS  --  7.4*   < > 3.3  3.3 1.9* 1.6* 2.7 2.2*  2.2*   < > = values in this interval not displayed.     Liver Function Tests: Recent Labs  Lab 04/21/2021 0348 04/27/2021 1256 04/08/21 0307 04/09/21 0521 04/09/21 1601 04/10/21 0312 04/10/21 1500 04/11/21 0404  AST 56* 42*  --  38  --   --   --  16  ALT 41 21  --  31  --   --   --  19  ALKPHOS 70 28*  --  44  --   --   --  63  BILITOT 0.9 1.4*  --  1.3*  --   --   --  0.7  PROT 6.6 3.8*  --  4.3*  --   --   --  4.5*  ALBUMIN 2.6* 2.3*   < > 2.0*  2.0* 1.9* 1.8* 1.7* 1.7*  1.8*   < > =  values in this interval not displayed.    Recent Labs  Lab 04/24/2021 0348  LIPASE 41    No results for input(s): AMMONIA in the last 168 hours.  CBC: Recent Labs  Lab 04/18/2021 1256 04/17/2021 1651 04/08/21 0307 04/09/21 0521 04/10/21 0312 04/11/21 0404  WBC 14.9*  --  21.3* 19.3* 21.8* 38.9*  NEUTROABS 14.5*  --   --  18.5* 20.4*  --   HGB 6.7* 10.5* 9.0* 8.3* 8.0* 10.1*  HCT 21.3* 31.3* 26.5* 24.2* 23.8* 31.2*  MCV 79.8*  --  81.8 82.9 83.2 86.9  PLT 266  --  167 131* 120* 97*     Cardiac Enzymes: No results for input(s): CKTOTAL, CKMB, CKMBINDEX, TROPONINI in the last 168 hours.  BNP: Invalid input(s): POCBNP  CBG: Recent Labs  Lab 04/10/21 1517 04/10/21 1908 04/10/21 2323 04/11/21 0323 04/11/21 0756  GLUCAP 231* 203* 140* 142* 93     Microbiology: Results for orders placed or performed during the hospital encounter of 04/17/2021  Resp Panel by RT-PCR (Flu A&B, Covid) Nasopharyngeal Swab     Status: None   Collection Time: 05/03/2021  4:40 AM   Specimen: Nasopharyngeal Swab; Nasopharyngeal(NP) swabs in vial transport medium   Result Value Ref Range Status   SARS Coronavirus 2 by RT PCR NEGATIVE NEGATIVE Final    Comment: (NOTE) SARS-CoV-2 target nucleic acids are NOT DETECTED.  The SARS-CoV-2 RNA is generally detectable in upper respiratory specimens during the acute phase of infection. The lowest concentration of SARS-CoV-2 viral copies this assay can detect is 138 copies/mL. A negative result does not preclude SARS-Cov-2 infection and should not be used as the sole basis for treatment or other patient management decisions. A negative result may occur with  improper specimen collection/handling, submission of specimen other than nasopharyngeal swab, presence of viral mutation(s) within the areas targeted by this assay, and inadequate number of viral copies(<138 copies/mL). A negative result must be combined with clinical observations, patient history, and epidemiological information. The expected result is Negative.  Fact Sheet for Patients:  EntrepreneurPulse.com.au  Fact Sheet for Healthcare Providers:  IncredibleEmployment.be  This test is no t yet approved or cleared by the [place] FDA and  has been authorized for detection and/or diagnosis of SARS-CoV-2 by FDA under an Emergency Use Authorization (EUA). This EUA will remain  in effect (meaning this test can be used) for the duration of the COVID-19 declaration under Section 564(b)(1) of the Act, 21 U.S.C.section 360bbb-3(b)(1), unless the authorization is terminated  or revoked sooner.       Influenza A by PCR NEGATIVE NEGATIVE Final   Influenza B by PCR NEGATIVE NEGATIVE Final    Comment: (NOTE) The Xpert Xpress SARS-CoV-2/FLU/RSV plus assay is intended as an aid in the diagnosis of influenza from Nasopharyngeal swab specimens and should not be used as a sole basis for treatment. Nasal washings and aspirates are unacceptable for Xpert Xpress SARS-CoV-2/FLU/RSV testing.  Fact Sheet for  Patients: EntrepreneurPulse.com.au  Fact Sheet for Healthcare Providers: IncredibleEmployment.be  This test is not yet approved or cleared by the [place] FDA and has been authorized for detection and/or diagnosis of SARS-CoV-2 by FDA under an Emergency Use Authorization (EUA). This EUA will remain in effect (meaning this test can be used) for the duration of the COVID-19 declaration under Section 564(b)(1) of the Act, 21 U.S.C. section 360bbb-3(b)(1), unless the authorization is terminated or revoked.  Performed at [facility], [address] [place], [place] [address]  Blood culture (routine x 2)     Status: None (Preliminary result)   Collection Time: 04/14/2021  5:33 AM   Specimen: BLOOD  Result Value Ref Range Status   Specimen Description BLOOD LEFT ANTECUBITAL  Final   Special Requests   Final    BOTTLES DRAWN AEROBIC AND ANAEROBIC Blood Culture adequate volume   Culture   Final    NO GROWTH 4 DAYS Performed at [facility], [address], [place], [place] [address]    Report Status PENDING  Incomplete  Blood culture (routine x 2)     Status: None (Preliminary result)   Collection Time: 04/05/2021  7:40 AM   Specimen: BLOOD  Result Value Ref Range Status   Specimen Description BLOOD BLOOD RIGHT WRIST  Final   Special Requests   Final    BOTTLES DRAWN AEROBIC AND ANAEROBIC Blood Culture adequate volume   Culture   Final    NO GROWTH 4 DAYS Performed at [facility], [address], [place], [place] [address]    Report Status PENDING  Incomplete  MRSA Next Gen by PCR, Nasal     Status: None   Collection Time: 05/04/2021 12:50 PM   Specimen: Nasal Mucosa; Nasal Swab  Result Value Ref Range Status   MRSA by PCR Next Gen NOT DETECTED NOT DETECTED Final    Comment: (NOTE) The GeneXpert MRSA Assay (FDA approved for NASAL specimens only), is one component of a comprehensive MRSA colonization  surveillance program. It is not intended to diagnose MRSA infection nor to guide or monitor treatment for MRSA infections. Test performance is not FDA approved in patients less than 72 years old. Performed at [facility], [address] [place], [place] [address]     Coagulation Studies: No results for input(s): LABPROT, INR in the last 72 hours.   Urinalysis: No results for input(s): COLORURINE, LABSPEC, PHURINE, GLUCOSEU, HGBUR, BILIRUBINUR, KETONESUR, PROTEINUR, UROBILINOGEN, NITRITE, LEUKOCYTESUR in the last 72 hours.  Invalid input(s): APPERANCEUR     Imaging: No results found.   Medications:     prismasol BGK 4/2.5 500 mL/hr at 04/11/21 0609    prismasol BGK 4/2.5 500 mL/hr at 04/11/21 1443   amiodarone 30 mg/hr (04/11/21 0015)   anidulafungin Stopped (04/10/21 1641)   HYDROmorphone 4 mg/hr (04/11/21 0610)   norepinephrine (LEVOPHED) Adult infusion 14 mcg/min (04/11/21 0900)   piperacillin-tazobactam 3.375 g (04/11/21 0855)   prismasol BGK 4/2.5 2,000 mL/hr at 04/11/21 0844   propofol (DIPRIVAN) infusion 25 mcg/kg/min (04/11/21 0610)   TPN ADULT (ION) 80 mL/hr at 04/10/21 2200   vasopressin 0.04 Units/min (04/11/21 0900)    albuterol  2.5 mg Nebulization Q6H   chlorhexidine gluconate (MEDLINE KIT)  15 mL Mouth Rinse BID   Chlorhexidine Gluconate Cloth  6 each Topical Q0600    HYDROmorphone (DILAUDID) injection  0.5 mg Intravenous Once   insulin aspart  0-20 Units Subcutaneous Q4H   insulin aspart  2 Units Subcutaneous Q4H   mouth rinse  15 mL Mouth Rinse 10 times per day   pantoprazole (PROTONIX) IV  40 mg Intravenous Daily   heparin sodium (porcine), HYDROmorphone, midazolam, polyvinyl alcohol  Assessment/ Plan:  75 y.o. female with a PMHx of hypertrophic cardiomyopathy, history of AICD placement, history of ventricular tachycardia, atrial fibrillation, history of diverticulitis, who was admitted to [facility] on 05/04/2021 for evaluation of diffuse  abdominal pain.  She was found to have bowel perforation with significant pneumoperitoneum.   1.  Acute kidney injury  secondary to ATN from septic shock. 2.  Metabolic acidosis. 3.  Acute respiratory failure. 4.  Hyponatremia. 5.  Bowel perforation status post exploratory laparotomy. 6.  Septic shock.  Plan: Patient remains critically ill with the inciting event of bowel perforation and subsequent septic shock.  At this point in time we will maintain the patient on CRRT with the fluid balance target of net even.  Nursing able to provide boluses as necessary to help support blood pressure.  Otherwise patient will be maintained on pressors to help support blood pressure.  In regards to CRRT this continues to progress well.  Serum electrolytes acceptable with serum potassium of 3.9 and serum bicarbonate of 28.  BUN has normalized to 17 with a creatinine of 0.78.  We will maintain the patient on CRRT.  Recommend repletion of phosphorus with sodium phosphorus now.   LOS: 4 Jakari Sada 7/7/20229:25 AM

## 2021-04-11 NOTE — Progress Notes (Addendum)
Nutrition Follow-up  DOCUMENTATION CODES:   Not applicable  INTERVENTION:   Continue TPN per pharmacy  Once pt is stable for tube feeds, recommend:  Pivot 1.5 _0 /hr- Initiate at 31m/hr and increase by 155mhr q 12 hours until goal rate is reached.   Pro-Source 4535mID via tube, provides 40kcal and 11g of protein per serving   Free water flushes 65m52m hours to maintain tube patency   Regimen provides 1560kcal/day, 123g/day protein and 908ml50m free water  Juven Fruit Punch BID via tube, each serving provides 95kcal and 2.5g of protein (amino acids glutamine and arginine)  Liquid MVI daily via tube   NUTRITION DIAGNOSIS:   Increased nutrient needs related to wound healing as evidenced by estimated needs.  GOAL:   Provide needs based on ASPEN/SCCM guidelines -Met with TPN   MONITOR:   Vent status, Labs, Weight trends, Skin, I & O's, Other (Comment) (TPN)  ASSESSMENT:   74 y/48female with h/o Afib, GERD, ICD, DM and hypertension who is admitted with perforated diverticulitis now s/p exploratory laparotomy, hartman's procedure, salpingo-oopherectomy and placement of wound vac for purulent peritonitis on 7/3.  Pt remains sedated and ventilated. Pt on pressors x 2 with requirements increasing. NGT in place to LIS with 120ml 22mut. Pt is having BMs. Pt continues on CRRT. Wound VAC in place. Will plan to initiate tube feeds once pt is more stable and it is OK per surgery. Pt tolerating TPN at goal rate. Pt continues to refeed. Per chart, pt is down 8lbs since admit. Pt +12.6L on her I & Os. Pt noted to have severe edema on exam.   Medications reviewed and include: hydromorphone, insulin, protonix, levophed, zosyn, propofol, vasopressin   Labs reviewed: Na 134(L), K 3.9 wnl, P 2.2(L), Mg 2.3 wnl, alb 1.7(L) Triglycerides 102 Pre-albumin <5(L)- 7/5  wbc- 38.9(H), Hgb 10.1(L), Hct 31.2(L) cbgs- 142, 72, 107 x 24 hrs  Patient is currently intubated on ventilator  support MV: 9.2 L/min Temp (24hrs), Avg:93.9 F (34.4 C), Min:93.02 F (33.9 C), Max:95 F (35 C)  Propofol: 7.62ml/h65mrovides 201kcal/day   MAP- >65mmHg 57m- 15ml  Dr49m- RUQ: 60 ccs out; serosanguinous and RLQ: 40 ccs out; serosanguinous  Diet Order:   Diet Order             Diet NPO time specified  Diet effective now                  EDUCATION NEEDS:   No education needs have been identified at this time  Skin: Reviewed RN Assessment (midline wound- 21cm x 6cm x 3cm), Wound VAC  Last BM:  7/7- via ostomy  Height:   Ht Readings from Last 1 Encounters:  04/11/21 _1  (1.651 m)    Weight:   Wt Readings from Last 1 Encounters:  04/11/21 75.2 kg    Ideal Body Weight:  56.8 kg  BMI:  Body mass index is 27.59 kg/m.  Estimated Nutritional Needs:   Kcal:  1455kcal/day  Protein:  110-125g/day  Fluid:  1.4-1.7L/day  Demauri Advincula CamKoleen DistanceLDN Please refer to [facility]/or RD on-call/weekend/after hours pager

## 2021-04-11 NOTE — Progress Notes (Signed)
PHARMACY - TOTAL PARENTERAL NUTRITION CONSULT NOTE   Indication:  extensive bowel surgery, poor PO intake PTA  Patient Measurements: Height: 5\' 5"  (165.1 cm) Weight: 75.2 kg (165 lb 12.6 oz) IBW/kg (Calculated) : 57 TPN AdjBW (KG): 63.5 Body mass index is 27.59 kg/m.  Assessment: 75 year old female with minimal PO intake 5 days prior to admission. Imaging showed bowel perforation; patient was taken to the OR for exploratory laparotomy with low anterior resection and colostomy placement. Patient is intubated and sedated in the ICU. Required vasopressors, which have been titrated down. Episode of atrial fibrillation with RVR 7/4, started on amiodarone infusion. Patient has h/o afib s/p ablation on Xarelto; unable to anticoagulate at this time s/t recent surgery. AKI with decreased UOP prior to hospitalization; started on CRRT 7/4.  Glucose / Insulin: on sensitive SSI 24 hr glucose range 72-231 24 hr insulin requirements 40 Electrolytes: hyperphosphatemia Renal: Scr 4.81 (decreased UOP pta), started on CRRT 7/4 Intake / Output: net (+) 11 L GI Imaging: 7/3 CT abdomen/pelvis: Bowel perforation with marked inflammatory changes and pneumoperitoneum throughout the abdomen and pelvis. Bowel ischemia not excluded. GI Surgeries / Procedures:  7/3 ex lap with low anterior resection and colostomy placement  Central access: CVC right IJ TPN start date: 7/4  Nutritional Goals (per RD recommendation on 7/4): kCal: 1800-2100, Protein: 95-120 g, Fluid: 1.8-2 L/day Goal TPN rate is 80 mL/hr (provides 105 g of protein and 1689 kcals per day)  **Patient receiving an additional 228 kcal from propofol at current rate 25 mcg/kg/min**  Current Nutrition: NPO  Plan:  Continue TPN at goal rate 80 mL/hr  Continues on propofol at 25 mcg/kg/min. TPN adjusted to accommodate for kcal provided by propofol.  Electrolytes in TPN: Na 50 mEq/L, K 0 mEq/L, Ca 5 mEq/L, Mg 5 mEq/L, and Phos 15 mmol/L. Cl:Ac  1:1 Patient started on CRRT 7/4. Plan to remain on CRRT per Nephrology. Remove potassium from TPN while patient remains on CRRT as potassium will be managed via CRRT fluid. Phosphorus remains low in setting of CRRT, although improved in last 24 hours. Repeat Na phos 30 mmol IV again today.  Add standard MVI and trace elements to TPN. Remove chromium for decreased renal function.  Continue Novolog 2 units standing coverage + resistant SSI.  Monitor TPN labs on Mon/Thurs at minimum. Currently ordered BID while patient is on CRRT  Tawnya Crook, PharmD 04/11/2021,11:21 AM

## 2021-04-11 NOTE — Progress Notes (Signed)
Pt responds to pain. Unable to follow commands. Eyes are open but no meaningful response to voice. Grimacing, prn versed administered. BP and arterial pressures dropped with prn versed and levophed titrated. CVP 2-4, bolus 500 cc NS per Dr. Holley Raring, will continue to monitor. Sugical sites, drains and colostomy intact. Stoma is dusky. ETT and NG intact and cm marked are the same as prior assessment.

## 2021-04-12 DIAGNOSIS — K631 Perforation of intestine (nontraumatic): Secondary | ICD-10-CM | POA: Diagnosis not present

## 2021-04-12 DIAGNOSIS — K651 Peritoneal abscess: Secondary | ICD-10-CM | POA: Diagnosis not present

## 2021-04-12 LAB — CBC WITH DIFFERENTIAL/PLATELET
Abs Immature Granulocytes: 2.71 10*3/uL — ABNORMAL HIGH (ref 0.00–0.07)
Basophils Absolute: 0.1 10*3/uL (ref 0.0–0.1)
Basophils Relative: 0 %
Eosinophils Absolute: 0.3 10*3/uL (ref 0.0–0.5)
Eosinophils Relative: 1 %
HCT: 28.5 % — ABNORMAL LOW (ref 36.0–46.0)
Hemoglobin: 9.2 g/dL — ABNORMAL LOW (ref 12.0–15.0)
Immature Granulocytes: 6 %
Lymphocytes Relative: 1 %
Lymphs Abs: 0.6 10*3/uL — ABNORMAL LOW (ref 0.7–4.0)
MCH: 27.9 pg (ref 26.0–34.0)
MCHC: 32.3 g/dL (ref 30.0–36.0)
MCV: 86.4 fL (ref 80.0–100.0)
Monocytes Absolute: 0.5 10*3/uL (ref 0.1–1.0)
Monocytes Relative: 1 %
Neutro Abs: 42.6 10*3/uL — ABNORMAL HIGH (ref 1.7–7.7)
Neutrophils Relative %: 91 %
Platelets: 72 10*3/uL — ABNORMAL LOW (ref 150–400)
RBC: 3.3 MIL/uL — ABNORMAL LOW (ref 3.87–5.11)
RDW: 19.4 % — ABNORMAL HIGH (ref 11.5–15.5)
Smear Review: NORMAL
WBC: 46.8 10*3/uL — ABNORMAL HIGH (ref 4.0–10.5)
nRBC: 0.3 % — ABNORMAL HIGH (ref 0.0–0.2)

## 2021-04-12 LAB — RENAL FUNCTION PANEL
Albumin: 1.7 g/dL — ABNORMAL LOW (ref 3.5–5.0)
Albumin: 1.8 g/dL — ABNORMAL LOW (ref 3.5–5.0)
Anion gap: 5 (ref 5–15)
Anion gap: 4 — ABNORMAL LOW (ref 5–15)
BUN: 15 mg/dL (ref 8–23)
BUN: 15 mg/dL (ref 8–23)
CO2: 26 mmol/L (ref 22–32)
CO2: 26 mmol/L (ref 22–32)
Calcium: 7.2 mg/dL — ABNORMAL LOW (ref 8.9–10.3)
Calcium: 7.4 mg/dL — ABNORMAL LOW (ref 8.9–10.3)
Chloride: 102 mmol/L (ref 98–111)
Chloride: 100 mmol/L (ref 98–111)
Creatinine, Ser: 0.68 mg/dL (ref 0.44–1.00)
Creatinine, Ser: 0.75 mg/dL (ref 0.44–1.00)
GFR, Estimated: >60 mL/min (ref >60)
GFR, Estimated: >60 mL/min (ref >60)
Glucose, Bld: 276 mg/dL — ABNORMAL HIGH (ref 70–99)
Glucose, Bld: 266 mg/dL — ABNORMAL HIGH (ref 70–99)
Phosphorus: 2.9 mg/dL (ref 2.5–4.6)
Phosphorus: 2.7 mg/dL (ref 2.5–4.6)
Potassium: 4.1 mmol/L (ref 3.5–5.1)
Potassium: 4.2 mmol/L (ref 3.5–5.1)
Sodium: 133 mmol/L — ABNORMAL LOW (ref 135–145)
Sodium: 130 mmol/L — ABNORMAL LOW (ref 135–145)

## 2021-04-12 LAB — GLUCOSE, CAPILLARY
Glucose-Capillary: 148 mg/dL — ABNORMAL HIGH (ref 70–99)
Glucose-Capillary: 177 mg/dL — ABNORMAL HIGH (ref 70–99)
Glucose-Capillary: 188 mg/dL — ABNORMAL HIGH (ref 70–99)
Glucose-Capillary: 195 mg/dL — ABNORMAL HIGH (ref 70–99)
Glucose-Capillary: 204 mg/dL — ABNORMAL HIGH (ref 70–99)
Glucose-Capillary: 232 mg/dL — ABNORMAL HIGH (ref 70–99)

## 2021-04-12 LAB — LACTATE DEHYDROGENASE: LDH: 152 U/L (ref 98–192)

## 2021-04-12 LAB — HEPATIC FUNCTION PANEL
ALT: 14 U/L (ref 0–44)
AST: 14 U/L — ABNORMAL LOW (ref 15–41)
Albumin: 1.7 g/dL — ABNORMAL LOW (ref 3.5–5.0)
Alkaline Phosphatase: 69 U/L (ref 38–126)
Bilirubin, Direct: 0.5 mg/dL — ABNORMAL HIGH (ref 0.0–0.2)
Indirect Bilirubin: 0.2 mg/dL — ABNORMAL LOW (ref 0.3–0.9)
Total Bilirubin: 0.7 mg/dL (ref 0.3–1.2)
Total Protein: 4.2 g/dL — ABNORMAL LOW (ref 6.5–8.1)

## 2021-04-12 LAB — CULTURE, BLOOD (ROUTINE X 2)
Culture: NO GROWTH
Culture: NO GROWTH
Special Requests: ADEQUATE
Special Requests: ADEQUATE

## 2021-04-12 LAB — PATHOLOGIST SMEAR REVIEW

## 2021-04-12 LAB — FIBRINOGEN: Fibrinogen: 451 mg/dL (ref 210–475)

## 2021-04-12 LAB — PROTIME-INR
INR: 1.4 — ABNORMAL HIGH (ref 0.8–1.2)
Prothrombin Time: 17.4 seconds — ABNORMAL HIGH (ref 11.4–15.2)

## 2021-04-12 LAB — MAGNESIUM: Magnesium: 2.3 mg/dL (ref 1.7–2.4)

## 2021-04-12 MED ORDER — ALBUMIN HUMAN 25 % IV SOLN
25.0000 g | Freq: Every day | INTRAVENOUS | Status: DC
  Administered 2021-04-13 – 2021-04-14 (×2): 25 g via INTRAVENOUS
  Filled 2021-04-12 (×2): qty 100

## 2021-04-12 MED ORDER — HYDROCORTISONE NA SUCCINATE PF 100 MG IJ SOLR
100.0000 mg | Freq: Two times a day (BID) | INTRAMUSCULAR | Status: DC
  Administered 2021-04-12 – 2021-04-14 (×5): 100 mg via INTRAVENOUS
  Filled 2021-04-12 (×5): qty 2

## 2021-04-12 MED ORDER — FLUCONAZOLE IN SODIUM CHLORIDE 400-0.9 MG/200ML-% IV SOLN
800.0000 mg | INTRAVENOUS | Status: DC
  Administered 2021-04-12 – 2021-04-14 (×3): 800 mg via INTRAVENOUS
  Filled 2021-04-12 (×5): qty 400

## 2021-04-12 MED ORDER — AMIODARONE HCL 200 MG PO TABS
400.0000 mg | ORAL_TABLET | Freq: Two times a day (BID) | ORAL | Status: DC
  Administered 2021-04-12 – 2021-04-14 (×4): 400 mg via NASOGASTRIC
  Filled 2021-04-12 (×4): qty 2

## 2021-04-12 MED ORDER — PIVOT 1.5 CAL PO LIQD
1000.0000 mL | ORAL | Status: DC
  Administered 2021-04-12: 1000 mL
  Filled 2021-04-12: qty 1000

## 2021-04-12 MED ORDER — SODIUM CHLORIDE 0.9 % IV SOLN
1.0000 g | Freq: Three times a day (TID) | INTRAVENOUS | Status: DC
  Administered 2021-04-12 – 2021-04-15 (×9): 1 g via INTRAVENOUS
  Filled 2021-04-12 (×11): qty 1

## 2021-04-12 MED ORDER — LACTATED RINGERS IV BOLUS
1000.0000 mL | Freq: Once | INTRAVENOUS | Status: AC
  Administered 2021-04-12: 1000 mL via INTRAVENOUS

## 2021-04-12 MED ORDER — DEXMEDETOMIDINE HCL IN NACL 400 MCG/100ML IV SOLN
0.4000 ug/kg/h | INTRAVENOUS | Status: DC
  Administered 2021-04-12: 0.6 ug/kg/h via INTRAVENOUS
  Administered 2021-04-12: 0.4 ug/kg/h via INTRAVENOUS
  Administered 2021-04-13 (×2): 0.8 ug/kg/h via INTRAVENOUS
  Administered 2021-04-13: 0.4 ug/kg/h via INTRAVENOUS
  Administered 2021-04-14: 1 ug/kg/h via INTRAVENOUS
  Administered 2021-04-14: 1.2 ug/kg/h via INTRAVENOUS
  Administered 2021-04-14 – 2021-04-15 (×4): 0.8 ug/kg/h via INTRAVENOUS
  Administered 2021-04-15 (×2): 1.2 ug/kg/h via INTRAVENOUS
  Administered 2021-04-15: 1 ug/kg/h via INTRAVENOUS
  Administered 2021-04-16 – 2021-04-18 (×13): 1.2 ug/kg/h via INTRAVENOUS
  Filled 2021-04-12 (×29): qty 100

## 2021-04-12 MED ORDER — TRAVASOL 10 % IV SOLN
INTRAVENOUS | Status: AC
  Filled 2021-04-12: qty 1176

## 2021-04-12 MED ORDER — FENTANYL 2500MCG IN NS 250ML (10MCG/ML) PREMIX INFUSION
0.0000 ug/h | INTRAVENOUS | Status: DC
  Administered 2021-04-12: 50 ug/h via INTRAVENOUS
  Administered 2021-04-13: 150 ug/h via INTRAVENOUS
  Administered 2021-04-13 – 2021-04-14 (×2): 175 ug/h via INTRAVENOUS
  Administered 2021-04-15: 300 ug/h via INTRAVENOUS
  Administered 2021-04-15 (×2): 250 ug/h via INTRAVENOUS
  Administered 2021-04-16: 300 ug/h via INTRAVENOUS
  Administered 2021-04-16: 325 ug/h via INTRAVENOUS
  Administered 2021-04-16: 300 ug/h via INTRAVENOUS
  Administered 2021-04-17 (×3): 350 ug/h via INTRAVENOUS
  Administered 2021-04-18: 400 ug/h via INTRAVENOUS
  Administered 2021-04-18: 350 ug/h via INTRAVENOUS
  Administered 2021-04-18 – 2021-04-20 (×5): 400 ug/h via INTRAVENOUS
  Administered 2021-04-20: 300 ug/h via INTRAVENOUS
  Administered 2021-04-20: 400 ug/h via INTRAVENOUS
  Filled 2021-04-12 (×23): qty 250

## 2021-04-12 MED ORDER — FREE WATER
30.0000 mL | Status: DC
  Administered 2021-04-13 (×3): 30 mL

## 2021-04-12 MED ORDER — FENTANYL BOLUS VIA INFUSION
50.0000 ug | INTRAVENOUS | Status: DC | PRN
  Administered 2021-04-15 (×2): 100 ug via INTRAVENOUS
  Administered 2021-04-15 (×2): 50 ug via INTRAVENOUS
  Administered 2021-04-18: 100 ug via INTRAVENOUS
  Filled 2021-04-12: qty 100

## 2021-04-12 MED ORDER — LACTATED RINGERS IV SOLN: Freq: Once | INTRAVENOUS | Status: AC

## 2021-04-12 MED ORDER — ALBUTEROL SULFATE (2.5 MG/3ML) 0.083% IN NEBU
2.5000 mg | INHALATION_SOLUTION | RESPIRATORY_TRACT | Status: DC | PRN
Start: 2021-04-12 — End: 2021-04-14

## 2021-04-12 NOTE — Progress Notes (Signed)
[facility]  ROUNDING NOTE   Subjective:  Patient seen and evaluated at bedside. Still critically ill and required mechanical ventilation, pressors, and TPN. Not making very much urine.  07/07 0701 - 07/08 0700 In: 8124.6 [I.V.:3969.1; IV Piggyback:4155.5] Out: 4921 [Urine:5; Emesis/NG output:100; Drains:140]    Objective:  Vital signs in last 24 hours:  Temp:  [93.38 F (34.1 C)-95.54 F (35.3 C)] 95.18 F (35.1 C) (07/08 0800) Pulse Rate:  [93-112] 112 (07/08 0744) Resp:  [20-22] 20 (07/08 0800) BP: (86-145)/(49-105) 105/54 (07/08 0800) SpO2:  [92 %-100 %] 98 % (07/08 0744) Arterial Line BP: (99-174)/(43-112) 125/44 (07/08 0800) FiO2 (%):  [24 %] 24 % (07/08 0744) Weight:  [79.1 kg] 79.1 kg (07/08 0318)  Weight change: 3.9 kg Filed Weights   04/10/21 0333 04/11/21 0320 04/12/21 0318  Weight: 78.6 kg 75.2 kg 79.1 kg    Intake/Output: I/O last 3 completed shifts: In: 10209.5 [I.V.:5820.7; Other:83.3; IV Piggyback:4305.5] Out: 7076 [Urine:20; Emesis/NG output:150; Drains:180; Other:6726]   Intake/Output this shift:  Total I/O In: 636.5 [I.V.:538.1; IV Piggyback:98.4] Out: 705 [Emesis/NG output:50; Other:655]  Physical Exam: General: Critically ill-appearing  Head: Normocephalic, atraumatic.  Endotracheal tube in place  Eyes: Anicteric  Neck: Supple  Lungs:  Clear to auscultation, vent assisted  Heart: S1S2 tachycardic  Abdomen:  Surgical wound noted, wound VAC in place  Extremities: Trace peripheral edema.  Neurologic: Intubated, sedated  Skin: No acute rash  Access: Left IJ temporary dialysis catheter    Basic Metabolic Panel: Recent Labs  Lab 04/08/21 0307 04/08/21 1650 04/09/21 0521 04/09/21 1601 04/10/21 0312 04/10/21 1500 04/11/21 0404 04/11/21 1616 04/12/21 0403  NA 132*   < > 137  135   < > 134* 135 134*  135 135 133*  K 3.8   < > 3.9  3.8   < > 3.6 3.4* 3.9  3.9 3.8 4.1  CL 101   < > 103  102   < > 104 104 101  101 102  102  CO2 19*   < > 27  27   < > '27 26 29  28 27 26  ' GLUCOSE 233*   < > 233*  238*   < > 237* 266* 211*  210* 229* 276*  BUN 71*   < > 40*  39*   < > '20 18 17  17 14 15  ' CREATININE 4.24*   < > 2.18*  2.33*   < > 1.09* 0.93 0.78  0.78 0.69 0.68  CALCIUM 7.2*   < > 7.3*  7.2*   < > 7.3* 7.2* 7.4*  7.3* 7.1* 7.2*  MG 1.9  --  2.3  --  2.3  --  2.3  --  2.3  PHOS 7.4*   < > 3.3  3.3   < > 1.6* 2.7 2.2*  2.2* 2.6 2.9   < > = values in this interval not displayed.     Liver Function Tests: Recent Labs  Lab 04/28/2021 0348 04/19/2021 1256 04/08/21 0307 04/09/21 0521 04/09/21 1601 04/10/21 1500 04/11/21 0404 04/11/21 1616 04/12/21 0403 04/12/21 0536  AST 56* 42*  --  38  --   --  16  --   --  14*  ALT 41 21  --  31  --   --  19  --   --  14  ALKPHOS 70 28*  --  44  --   --  63  --   --  69  BILITOT  0.9 1.4*  --  1.3*  --   --  0.7  --   --  0.7  PROT 6.6 3.8*  --  4.3*  --   --  4.5*  --   --  4.2*  ALBUMIN 2.6* 2.3*   < > 2.0*  2.0*   < > 1.7* 1.7*  1.8* 1.8* 1.7* 1.7*   < > = values in this interval not displayed.    Recent Labs  Lab 04/25/2021 0348  LIPASE 41    No results for input(s): AMMONIA in the last 168 hours.  CBC: Recent Labs  Lab 04/17/2021 1256 04/11/2021 1651 04/08/21 0307 04/09/21 0521 04/10/21 0312 04/11/21 0404 04/12/21 0403  WBC 14.9*  --  21.3* 19.3* 21.8* 38.9* 46.8*  NEUTROABS 14.5*  --   --  18.5* 20.4*  --  42.6*  HGB 6.7*   < > 9.0* 8.3* 8.0* 10.1* 9.2*  HCT 21.3*   < > 26.5* 24.2* 23.8* 31.2* 28.5*  MCV 79.8*  --  81.8 82.9 83.2 86.9 86.4  PLT 266  --  167 131* 120* 97* 72*   < > = values in this interval not displayed.     Cardiac Enzymes: No results for input(s): CKTOTAL, CKMB, CKMBINDEX, TROPONINI in the last 168 hours.  BNP: Invalid input(s): POCBNP  CBG: Recent Labs  Lab 04/11/21 1614 04/11/21 1905 04/11/21 2320 04/12/21 0323 04/12/21 0724  GLUCAP 213* 114* 174* 232* 195*     Microbiology: Results for orders  placed or performed during the hospital encounter of 04/29/2021  Resp Panel by RT-PCR (Flu A&B, Covid) Nasopharyngeal Swab     Status: None   Collection Time: 05/05/2021  4:40 AM   Specimen: Nasopharyngeal Swab; Nasopharyngeal(NP) swabs in vial transport medium  Result Value Ref Range Status   SARS Coronavirus 2 by RT PCR NEGATIVE NEGATIVE Final    Comment: (NOTE) SARS-CoV-2 target nucleic acids are NOT DETECTED.  The SARS-CoV-2 RNA is generally detectable in upper respiratory specimens during the acute phase of infection. The lowest concentration of SARS-CoV-2 viral copies this assay can detect is 138 copies/mL. A negative result does not preclude SARS-Cov-2 infection and should not be used as the sole basis for treatment or other patient management decisions. A negative result may occur with  improper specimen collection/handling, submission of specimen other than nasopharyngeal swab, presence of viral mutation(s) within the areas targeted by this assay, and inadequate number of viral copies(<138 copies/mL). A negative result must be combined with clinical observations, patient history, and epidemiological information. The expected result is Negative.  Fact Sheet for Patients:  EntrepreneurPulse.com.au  Fact Sheet for Healthcare Providers:  IncredibleEmployment.be  This test is no t yet approved or cleared by the [place] FDA and  has been authorized for detection and/or diagnosis of SARS-CoV-2 by FDA under an Emergency Use Authorization (EUA). This EUA will remain  in effect (meaning this test can be used) for the duration of the COVID-19 declaration under Section 564(b)(1) of the Act, 21 U.S.C.section 360bbb-3(b)(1), unless the authorization is terminated  or revoked sooner.       Influenza A by PCR NEGATIVE NEGATIVE Final   Influenza B by PCR NEGATIVE NEGATIVE Final    Comment: (NOTE) The Xpert Xpress SARS-CoV-2/FLU/RSV plus assay is  intended as an aid in the diagnosis of influenza from Nasopharyngeal swab specimens and should not be used as a sole basis for treatment. Nasal washings and aspirates are unacceptable for Xpert Xpress SARS-CoV-2/FLU/RSV testing.  Fact  Sheet for Patients: EntrepreneurPulse.com.au  Fact Sheet for Healthcare Providers: IncredibleEmployment.be  This test is not yet approved or cleared by the [place] FDA and has been authorized for detection and/or diagnosis of SARS-CoV-2 by FDA under an Emergency Use Authorization (EUA). This EUA will remain in effect (meaning this test can be used) for the duration of the COVID-19 declaration under Section 564(b)(1) of the Act, 21 U.S.C. section 360bbb-3(b)(1), unless the authorization is terminated or revoked.  Performed at [facility], [address] [place], [place] [address]   Blood culture (routine x 2)     Status: None   Collection Time: 04/11/2021  5:33 AM   Specimen: BLOOD  Result Value Ref Range Status   Specimen Description BLOOD LEFT ANTECUBITAL  Final   Special Requests   Final    BOTTLES DRAWN AEROBIC AND ANAEROBIC Blood Culture adequate volume   Culture   Final    NO GROWTH 5 DAYS Performed at [facility], [address] [place], [place] [address]    Report Status 04/12/2021 FINAL  Final  Blood culture (routine x 2)     Status: None   Collection Time: 04/10/2021  7:40 AM   Specimen: BLOOD  Result Value Ref Range Status   Specimen Description BLOOD BLOOD RIGHT WRIST  Final   Special Requests   Final    BOTTLES DRAWN AEROBIC AND ANAEROBIC Blood Culture adequate volume   Culture   Final    NO GROWTH 5 DAYS Performed at [facility], [address], [place], [place] [address]    Report Status 04/12/2021 FINAL  Final  MRSA Next Gen by PCR, Nasal     Status: None   Collection Time: 04/10/2021 12:50 PM   Specimen: Nasal Mucosa; Nasal Swab  Result Value Ref Range  Status   MRSA by PCR Next Gen NOT DETECTED NOT DETECTED Final    Comment: (NOTE) The GeneXpert MRSA Assay (FDA approved for NASAL specimens only), is one component of a comprehensive MRSA colonization surveillance program. It is not intended to diagnose MRSA infection nor to guide or monitor treatment for MRSA infections. Test performance is not FDA approved in patients less than 90 years old. Performed at [facility], [address] [place], [place] [address]   Body fluid culture w Gram Stain     Status: None (Preliminary result)   Collection Time: 04/10/21  4:30 PM   Specimen: JP Drain; Body Fluid  Result Value Ref Range Status   Specimen Description   Final    JP DRAINAGE Performed at [facility], [address] [place], [place] [address]    Special Requests   Final    Normal Performed at [facility], [address] [place], [place] [address]    Gram Stain   Final    ABUNDANT WBC PRESENT, PREDOMINANTLY PMN NO ORGANISMS SEEN    Culture   Final    RARE GRAM NEGATIVE RODS IDENTIFICATION AND SUSCEPTIBILITIES TO FOLLOW CRITICAL RESULT CALLED TO, READ BACK BY AND VERIFIED WITH: RN E.JENON AT 0945 ON 04/11/2021 BY T.SAAD. Performed at [facility], [place] [address] [place], [place] [address]    Report Status PENDING  Incomplete    Coagulation Studies: Recent Labs    04/12/21 0536  LABPROT 17.4*  INR 1.4*     Urinalysis: No results for input(s): COLORURINE, LABSPEC, PHURINE, GLUCOSEU, HGBUR, BILIRUBINUR, KETONESUR, PROTEINUR, UROBILINOGEN, NITRITE, LEUKOCYTESUR in the last 72 hours.  Invalid input(s): APPERANCEUR  Imaging: No results found.   Medications:     prismasol BGK 4/2.5 500 mL/hr at 04/12/21 0238    prismasol BGK 4/2.5 500 mL/hr at 04/12/21 0238   [START ON 04/13/2021] albumin human     amiodarone 30 mg/hr (04/11/21 2354)   fentaNYL infusion INTRAVENOUS     fluconazole (DIFLUCAN) IV     HYDROmorphone 4 mg/hr  (04/12/21 1000)   norepinephrine (LEVOPHED) Adult infusion 20 mcg/min (04/12/21 1000)   piperacillin-tazobactam Stopped (04/12/21 0838)   prismasol BGK 4/2.5 2,000 mL/hr at 04/12/21 0726   TPN ADULT (ION) Stopped (04/12/21 0940)   vasopressin 0.04 Units/min (04/12/21 1000)    chlorhexidine gluconate (MEDLINE KIT)  15 mL Mouth Rinse BID   Chlorhexidine Gluconate Cloth  6 each Topical Q0600   feeding supplement (PIVOT 1.5 CAL)  1,000 mL Per Tube Q24H   free water  30 mL Per Tube Q4H   hydrocortisone sod succinate (SOLU-CORTEF) inj  100 mg Intravenous Q12H   insulin aspart  0-20 Units Subcutaneous Q4H   insulin aspart  2 Units Subcutaneous Q4H   mouth rinse  15 mL Mouth Rinse 10 times per day   pantoprazole (PROTONIX) IV  40 mg Intravenous Daily   albuterol, heparin sodium (porcine), HYDROmorphone, midazolam, polyvinyl alcohol  Assessment/ Plan:  75 y.o. female with a PMHx of hypertrophic cardiomyopathy, history of AICD placement, history of ventricular tachycardia, atrial fibrillation, history of diverticulitis, who was admitted to [facility] on 05/05/2021 for evaluation of diffuse abdominal pain.  She was found to have bowel perforation with significant pneumoperitoneum.   1.  Acute kidney injury secondary to ATN from septic shock. 2.  Metabolic acidosis. 3.  Acute respiratory failure. 4.  Hyponatremia. 5.  Bowel perforation status post exploratory laparotomy. 6.  Septic shock.  Plan: Patient remains critically ill and requiring pressors to maintain blood pressure.  Therefore hemodynamic instability persist.  Renal parameters significantly improved and potassium acceptable at 4.1.  Serum bicarbonate also acceptable at 26.  We will continue CRRT given hemodynamic instability.  Phosphorus improved to 2.9 post repletion.  We will monitor along closely with you.   LOS: 5 Mordechai Matuszak 7/8/202211:00 AM

## 2021-04-12 NOTE — Progress Notes (Signed)
Pharmacy Antibiotic Note  Lindsay [name] is a 75 y.o. female w/ PMH of HTN, DM, CHF, atrial fibrillation, diverticular disease, sigmoid stricture admitted on 05/01/2021 post emergent ex lap for perforated viscus, peritonitis, severe sepsis. Pharmacy has been consulted for Zosyn and fluconazole dosing (previously on anidulafungin). Patient on CRRT  Today, 04/12/2021 Day #6 pip/tazo and antifungal coverage WBC 46.8 (trending up) No fevers but rather HYPOthermic Renal - remains on CRRT - CVVHDF (Effluent 57ml/kg/h) No OR culture 7/6 JP drain = NGTD 7/3 Blood cx: NG-F Remains VDRF and on pressors ID consulted 7/7  Plan: Fluconazole 800mg  IV q24h while on CRRT Last dose of anidulafungin 7/7 @ ~1500 so will start at similar time today Monitor plan for CRRT  Monitor LFTs Continue Zosyn to 3.375 grams IV every 6 hours Monitor renal function and CRRT plans for needed dose adjustments  Height: 5\' 5"  (165.1 cm) Weight: 79.1 kg (174 lb 6.1 oz) IBW/kg (Calculated) : 57  Temp (24hrs), Avg:94.2 F (34.6 C), Min:93.38 F (34.1 C), Max:95.54 F (35.3 C)  Recent Labs  Lab 04/22/2021 0527 04/17/2021 0726 04/28/2021 1256 04/08/21 0307 04/08/21 1650 04/09/21 0521 04/09/21 1124 04/09/21 1601 04/10/21 0312 04/10/21 1500 04/11/21 0404 04/11/21 1616 04/12/21 0403  WBC  --   --  14.9* 21.3*  --  19.3*  --   --  21.8*  --  38.9*  --  46.8*  CREATININE  --   --  3.95* 4.24*   < > 2.18*  2.33*  --    < > 1.09* 0.93 0.78  0.78 0.69 0.68  LATICACIDVEN 2.8* 2.9* 2.9* 1.4  --   --  1.3  --   --   --   --   --   --    < > = values in this interval not displayed.     Estimated Creatinine Clearance: 63.1 mL/min (by C-G formula based on SCr of 0.68 mg/dL).    Allergies  Allergen Reactions   Amiodarone     Other reaction(s): Other (See Comments) Nausea, diarrhea, weight loss, anxiety, jitteriness   Levofloxacin Rash    Red palms, lips, forearms    Meloxicam Shortness Of Breath    Other reaction(s):  Respiratory Distress   Amlodipine     Other reaction(s): Other (See Comments) Caused severe gingival hyperplasia   Amlodipine Besy-Benazepril Hcl     Severe gingival hyperplasia  Other reaction(s): Other (See Comments) Other reaction(s): Other   Beta Adrenergic Blockers     States severe fatigue  Other reaction(s): Other (See Comments) fatigue    Antimicrobials this admission: anidulafungin 07/03 >> 7/7 Fluconazole 7/8 >> Zosyn 07/03 >>   Microbiology results: 07/03 BCx: NG x1 day  07/03 MRSA PCR: negative 07/03 SARS CoV-2: negative 07/03 influenza A/B: negative  Thank you for allowing pharmacy to be a part of this patient's care.  Doreene Eland, PharmD, BCPS.   Work Cell: [phone] 04/12/2021 8:58 AM

## 2021-04-12 NOTE — Plan of Care (Signed)
  Problem: Education: Goal: Knowledge of General Education information will improve Description: Including pain rating scale, medication(s)/side effects and non-pharmacologic comfort measures Outcome: Not Progressing   Problem: Health Behavior/Discharge Planning: Goal: Ability to manage health-related needs will improve Outcome: Not Progressing   Problem: Clinical Measurements: Goal: Ability to maintain clinical measurements within normal limits will improve Outcome: Not Progressing Goal: Will remain free from infection Outcome: Not Progressing Goal: Diagnostic test results will improve Outcome: Not Progressing Goal: Respiratory complications will improve Outcome: Not Progressing Goal: Cardiovascular complication will be avoided Outcome: Not Progressing   Problem: Activity: Goal: Risk for activity intolerance will decrease Outcome: Not Progressing   Problem: Nutrition: Goal: Adequate nutrition will be maintained Outcome: Not Progressing   Problem: Elimination: Goal: Will not experience complications related to bowel motility Outcome: Not Progressing   Problem: Pain Managment: Goal: General experience of comfort will improve Outcome: Not Progressing   Problem: Skin Integrity: Goal: Risk for impaired skin integrity will decrease Outcome: Not Progressing

## 2021-04-12 NOTE — Progress Notes (Signed)
ID Pt is critically ill Intubated On vent CRRT On pressors Sedated BP 120/74   Pulse 72   Temp (!) 93.6 F (34.2 C)   Resp 20   Ht 5\' 5"  (1.651 m)   Wt 79.1 kg   SpO2 98%   BMI 29.02 kg/m    Left IJ dialysis cath Foley Left radial arterial line Abd JP drains Wound vac Colostomy RT IJ triple lumen  Labs CBC    Component Value Date/Time   WBC 46.8 (H) 04/12/2021 0403   RBC 3.30 (L) 04/12/2021 0403   HGB 9.2 (L) 04/12/2021 0403   HCT 28.5 (L) 04/12/2021 0403   PLT 72 (L) 04/12/2021 0403   MCV 86.4 04/12/2021 0403   MCH 27.9 04/12/2021 0403   MCHC 32.3 04/12/2021 0403   RDW 19.4 (H) 04/12/2021 0403   LYMPHSABS 0.6 (L) 04/12/2021 0403   MONOABS 0.5 04/12/2021 0403   EOSABS 0.3 04/12/2021 0403   BASOSABS 0.1 04/12/2021 0403     CMP Latest Ref Rng & Units 04/12/2021 04/12/2021 04/12/2021  Glucose 70 - 99 mg/dL 266(H) - 276(H)  BUN 8 - 23 mg/dL 15 - 15  Creatinine 0.44 - 1.00 mg/dL 0.75 - 0.68  Sodium 135 - 145 mmol/L 130(L) - 133(L)  Potassium 3.5 - 5.1 mmol/L 4.2 - 4.1  Chloride 98 - 111 mmol/L 100 - 102  CO2 22 - 32 mmol/L 26 - 26  Calcium 8.9 - 10.3 mg/dL 7.4(L) - 7.2(L)  Total Protein 6.5 - 8.1 g/dL - 4.2(L) -  Total Bilirubin 0.3 - 1.2 mg/dL - 0.7 -  Alkaline Phos 38 - 126 U/L - 69 -  AST 15 - 41 U/L - 14(L) -  ALT 0 - 44 U/L - 14 -     Micro Culture from fluid from JP drain- klebsiella  Impression/recommendation  Perforated diverticulitis with intraabdominal abscess, pneumoperitoneum s/p exp lap, lower anterior resection/colostomy. multi organ failure- renal failure, resp failure Circulatory failure.  Worsening leucocytosis - 46K- need to repeat CT abdomen to look for dehiscence , or abscess re-accumulation Pt currently on zosyn and fluconazole ( eraxis de-escalated) Will change zosyn to meropenem  Acute resp failure= intubated  Septic shock on pressors AKI due to septic shock- causing ATN- on CRRT  Pt is critically ill  Discussed the  management with care team

## 2021-04-12 NOTE — Progress Notes (Signed)
1 liter LR fluid resuscitation completed as ordered. CVP re-calibrated and still reading 4. Domingo Pulse, NP made aware. Patient endorses pain at this time. PRN order for Fentanyl bolus requested and gtt adjusted accordingly. Will continue to monitor.

## 2021-04-12 NOTE — TOC Initial Note (Signed)
Transition of Care [facility]) - Initial/Assessment Note    Patient Details  Name: Lindsay [name] MRN: [id number] Date of Birth: 01/25/1946  Transition of Care [facility]) CM/SW Contact:    Kerin Salen, RN Phone Number: 04/12/2021, 11:29 AM  Clinical Narrative: Patient on vent, called and spoke with husband, Lindsay [name].Husband states patient was able to drive prior to admission. Husband states they do not sleep in the same bed so he is unable to provide information about ADL's and cooking, he cooks and shops for himself and assist with picking up medication from [facility] on [address]. Husband states patient also uses mail order service for regular medications, did not know the name of pharmacy. Husband states patient did not receive HH services, do not need ambulation device, no home oxygen.  TOC to continue to track for discharge needs.                  Expected Discharge Plan: Home/Self Care Barriers to Discharge: Continued Medical Work up   Patient Goals and CMS Choice Patient states their goals for this hospitalization and ongoing recovery are:: Spoke with Husband, he is unsure at this time, waiting for MD evaluation.      Expected Discharge Plan and Services Expected Discharge Plan: Home/Self Care In-house Referral: Clinical Social Work     Living arrangements for the past 2 months: Single Family Home                           HH Arranged: NA [place] Agency: NA        Prior Living Arrangements/Services Living arrangements for the past 2 months: [place] Lives with:: Spouse          Need for Family Participation in Patient Care: Yes (Comment) Care giver support system in place?: Yes (comment)   Criminal Activity/Legal Involvement Pertinent to Current Situation/Hospitalization: No - Comment as needed  Activities of Daily Living Home Assistive Devices/Equipment: None ADL Screening (condition at time of admission) Patient's cognitive ability adequate to safely  complete daily activities?: Yes Is the patient deaf or have difficulty hearing?: No Does the patient have difficulty seeing, even when wearing glasses/contacts?: No Does the patient have difficulty concentrating, remembering, or making decisions?: No Patient able to express need for assistance with ADLs?: Yes Does the patient have difficulty dressing or bathing?: No Independently performs ADLs?: Yes (appropriate for developmental age) Does the patient have difficulty walking or climbing stairs?: Yes Weakness of Legs: Both Weakness of Arms/Hands: None  Permission Sought/Granted                  Emotional Assessment   Attitude/Demeanor/Rapport: Unable to Assess Affect (typically observed): Unable to Assess   Alcohol / Substance Use: Not Applicable Psych Involvement: No (comment)  Admission diagnosis:  Hyponatremia [E87.1] Bowel perforation (HCC) [K63.1] Acute renal failure, unspecified acute renal failure type ([place]) [N17.9] Patient Active Problem List   Diagnosis Date Noted   Coagulopathy ([place]) 04/08/2021   Acute respiratory failure ([place]) 04/08/2021   On mechanically assisted ventilation ([place]) 04/08/2021   Acute blood loss anemia 04/08/2021   Peritonitis ([place]) 04/10/2021   Severe sepsis ([place]) 05/04/2021   Acute renal failure (ARF) ([place]) 04/13/2021   Lactic acidosis 04/19/2021   Volume depletion 04/21/2021   Bowel perforation ([place])    Defibrillator discharge 05/09/2020   Atrial fibrillation with rapid ventricular response ([place]) 05/09/2020   Anticoagulated 12/30/2019   ICD (implantable cardioverter-defibrillator), dual, in situ  07/26/2019   Gastro-esophageal reflux disease with esophagitis 12/30/2017   PCP:  Rusty Aus, MD Pharmacy:   [facility] [phone], [place] [address] Phone: [phone] Fax: [phone]     Social Determinants of Health ([place])  Interventions    Readmission Risk Interventions Readmission Risk Prevention Plan 04/12/2021  Transportation Screening Complete  PCP or Specialist Appt within 5-7 Days Not Complete  Not Complete comments To be arranged at discharge  [place] Screening Complete  Medication Review (RN CM) Complete

## 2021-04-12 NOTE — Progress Notes (Signed)
PHARMACY - TOTAL PARENTERAL NUTRITION CONSULT NOTE   Indication:  extensive bowel surgery, poor PO intake PTA  Patient Measurements: Height: 5\' 5"  (165.1 cm) Weight: 79.1 kg (174 lb 6.1 oz) IBW/kg (Calculated) : 57 TPN AdjBW (KG): 63.5 Body mass index is 29.02 kg/m.  Assessment: 75 year old female with minimal PO intake 5 days prior to admission. Imaging showed bowel perforation; patient was taken to the OR for exploratory laparotomy with low anterior resection and colostomy placement. Patient is intubated and sedated in the ICU. Required vasopressors, which have been titrated down. Episode of atrial fibrillation with RVR 7/4, started on amiodarone infusion. Patient has h/o afib s/p ablation on Xarelto; unable to anticoagulate at this time s/t recent surgery. AKI with decreased UOP prior to hospitalization; started on CRRT 7/4.  Glucose / Insulin: on sensitive SSI 24 hr glucose range 107 - 276 24 hr insulin requirements 32u Electrolytes: within normal limits Renal: Started on CRRT 7/4 Intake / Output: net (+) 14.5 L GI Imaging: 7/3 CT abdomen/pelvis: Bowel perforation with marked inflammatory changes and pneumoperitoneum throughout the abdomen and pelvis. Bowel ischemia not excluded. GI Surgeries / Procedures:  7/3 ex lap with low anterior resection and colostomy placement  Central access: CVC right IJ TPN start date: 7/4  Nutritional Goals (per RD recommendation on 7/7): kCal: 1455 kcal/day, Protein: 110-125 g, Fluid: 1.4-1.7 L/day Goal TPN rate is 70 mL/hr (provides 117 g of protein and 1434 kcals per day)  Propofol stopped 7/8 Pivot 1.5 cal at 15 mL/hr started 7/8  Current Nutrition: NPO  Plan:  --Modify TPN to new goal rate 70 mL/hr. Nutritional adjustments made per updated RD recommendations Contains 117 g protein, 184 g dextrose, 33.6 g lipids. Total Kcal 1434. Total volume 1680 mL --Electrolytes in TPN: Na 50 mEq/L, K 0 mEq/L, Ca 5 mEq/L, Mg 5 mEq/L, and Phos 15 mmol/L.  Cl:Ac 1:1 Patient started on CRRT 7/4. Plan to remain on CRRT per Nephrology Remove potassium from TPN while patient remains on CRRT as potassium will be managed via CRRT fluid Phosphorus improved with replacement yesterday. Continue to monitor while on CRRT --Add standard MVI and trace elements to TPN. Remove chromium for decreased renal function. --Continue Novolog 2 units q4h + resistant SSI q4h. Solu-cortef started 7/8. Dextrose content in new TPN decreased. Newly starting on trickle feeds. Anticipate adjustments based on changes --Monitor TPN labs on Mon/Thurs at minimum. Currently ordered BID while patient is on CRRT  Benita Gutter 04/12/2021,8:16 AM

## 2021-04-12 NOTE — Progress Notes (Signed)
Pharmacy Antibiotic Note  Lindsay [name] is a 75 y.o. female w/ PMH of HTN, DM, CHF, atrial fibrillation, diverticular disease, sigmoid stricture admitted on 05/04/2021 post emergent ex lap for perforated viscus, peritonitis, severe sepsis. Pharmacy has been consulted for meropenem and fluconazole dosing (previously on anidulafungin). Patient on CRRT  Today, 04/12/2021 Day #6 pip/tazo and antifungal coverage WBC 46.8 (trending up) No fevers but rather HYPOthermic Renal - remains on CRRT - CVVHDF (Effluent 35ml/kg/h) No OR culture 7/6 JP drain = K pneumoniae 7/3 Blood cx: NG-F Remains VDRF and on pressors ID consulted 7/7  Plan: Fluconazole 800mg  IV q24h while on CRRT Last dose of anidulafungin 7/7 @ ~1500 so will start at similar time today Monitor plan for CRRT  Monitor LFTs Due to K pneumoniae in JP culture and worsening clinical status, change to meropenem 1gm IV q8h while on CRRT  Height: 5\' 5"  (165.1 cm) Weight: 79.1 kg (174 lb 6.1 oz) IBW/kg (Calculated) : 57  Temp (24hrs), Avg:94.3 F (34.6 C), Min:93.38 F (34.1 C), Max:95.54 F (35.3 C)  Recent Labs  Lab 04/26/2021 0527 05/05/2021 0726 04/13/2021 1256 04/08/21 0307 04/08/21 1650 04/09/21 0521 04/09/21 1124 04/09/21 1601 04/10/21 0312 04/10/21 1500 04/11/21 0404 04/11/21 1616 04/12/21 0403  WBC  --   --  14.9* 21.3*  --  19.3*  --   --  21.8*  --  38.9*  --  46.8*  CREATININE  --   --  3.95* 4.24*   < > 2.18*  2.33*  --    < > 1.09* 0.93 0.78  0.78 0.69 0.68  LATICACIDVEN 2.8* 2.9* 2.9* 1.4  --   --  1.3  --   --   --   --   --   --    < > = values in this interval not displayed.     Estimated Creatinine Clearance: 63.1 mL/min (by C-G formula based on SCr of 0.68 mg/dL).    Allergies  Allergen Reactions   Amiodarone     Other reaction(s): Other (See Comments) Nausea, diarrhea, weight loss, anxiety, jitteriness   Levofloxacin Rash    Red palms, lips, forearms    Meloxicam Shortness Of Breath    Other  reaction(s): Respiratory Distress   Amlodipine     Other reaction(s): Other (See Comments) Caused severe gingival hyperplasia   Amlodipine Besy-Benazepril Hcl     Severe gingival hyperplasia  Other reaction(s): Other (See Comments) Other reaction(s): Other   Beta Adrenergic Blockers     States severe fatigue  Other reaction(s): Other (See Comments) fatigue    Antimicrobials this admission: anidulafungin 07/03 >> 7/7 Fluconazole 7/8 >> Zosyn 07/03 >> 7/8 Meropenem 7/8 >>  Microbiology results: 07/03 BCx: NG x1 day  07/03 MRSA PCR: negative 07/03 SARS CoV-2: negative 07/03 influenza A/B: negative  Thank you for allowing pharmacy to be a part of this patient's care.  Doreene Eland, PharmD, BCPS.   Work Cell: [phone] 04/12/2021 3:31 PM

## 2021-04-12 NOTE — Progress Notes (Signed)
Pt care assumed. Poc noted. No changes to note. Pt remains on prop/levo gtt. Crrt ongoing. Will continue as ordered

## 2021-04-12 NOTE — Progress Notes (Signed)
1L LR bolus administered independent of CRRT net goals. Per Nephrology okay to make pt goal +100 per hour so as to not pull off the 1L LR continuous (one time) infusion at 100/hr for low CVP. Goal for pt to be +800 at the end of my shift. +1000 by 2100 today. Then back to net 0 per/hr for net goal after the one time

## 2021-04-12 NOTE — Consult Note (Addendum)
[place] Nurse Consult Note: Reason for Consult: NPWT dressing change. I am assisted in today's dressing change by Bedside RN. Wound type:Surgical Pressure Injury POA: N/A Measurement: 23cm x 4.4cm x 3cm Wound bed: pale red, thin, dry layer of adipose tissue on side walls of wound. Base is friable, sutures evident at portion of wound bed just distal to umbilicus Drainage (amount, consistency, odor) small, serous Periwound:intact Dressing procedure/placement/frequency: one piece of black foam removed from wound, wound cleansed with NS and gently patted dry. A piece of skin barrier ring is used to enhance seal by placing on the periwound skin from 5-7 o'clock. One piece of black foam used to obliterate dead space and drape applied. Dressing attached to 142mmHg continuous negative pressure and an immediate seal is achieved. Dressing is labeled. Patient is intubated and on CRRT. She tolerated the procedure well.  Two Large NPWT dressing kits at bedside for changes next week.  [place] Nurse ostomy consult note Stoma type/location: left upper quadrant colostomy Stomal assessment/size: oval and slightly below skin level, 1 inch x 1 and 1/4 inch. Deep red. Peristomal assessment: intact Treatment options for stomal/peristomal skin: skin barrier ring segment Output: small amount of dark brown pasty stool at stoma at pouch change. Ostomy pouching: 1pc. Pouching system, easily compressible convex.  Education provided: None today Enrolled patient in [place] program:No   Three pouches at bedside for future pouch changes.  [place] nursing team will follow, and will remain available to this patient, the nursing, surgical and medical teams.   Thanks, Maudie Flakes, MSN, RN, [place], Arther Abbott  Pager# [phone]

## 2021-04-12 NOTE — Progress Notes (Addendum)
[facility] Hospital Day(s): 5.   Post op day(s): 5 Days Post-Op.   Interval History:  Patient seen and examined Patient remains intubated and sedated Tachycardic; in atrial fibrillation; Amiodarone at 30 ml/hr Vasopressors: Levophed at 14 mcg/min and 0.04 units vasopressin restarted Leukocytosis up again this morning to 46.8K Hgb stable up to 9.2 Renal function making improvements; sCr - 0.68; UO - 5 ccs On CRRT No significant electrolyte derangements NGT with 100 ccs out Surgical drains             - RUQ: 90 ccs out; serosanguinous             - RLQ: 50 ccs out; serosanguinous Colostomy retracted, serosanguinous output  Wound vac to midline wound; WOC to follow  On TPN On Zosyn   Vital signs in last 24 hours: [min-max] current  Temp:  [93.38 F (34.1 C)-95.54 F (35.3 C)] 95.36 F (35.2 C) (07/08 0700) Pulse Rate:  [93-108] 108 (07/08 0200) Resp:  [18-22] 20 (07/08 0700) BP: (86-145)/(49-105) 110/71 (07/08 0700) SpO2:  [92 %-100 %] 100 % (07/08 0454) Arterial Line BP: (99-183)/(43-112) 120/44 (07/08 0700) FiO2 (%):  [24 %] 24 % (07/08 0454) Weight:  [79.1 kg] 79.1 kg (07/08 0318)     Height: 5\' 5"  (165.1 cm) Weight: 79.1 kg BMI (Calculated): 29.02   Intake/Output last 2 shifts:  07/07 0701 - 07/08 0700 In: 8124.6 [I.V.:3969.1; IV Piggyback:4155.5] Out: 4921 [Urine:5; Emesis/NG output:100; Drains:140]   Physical Exam:  Constitutional: Intubated; Sedated HEENT: NGT in place  Respiratory: on ventilator  Cardiovascular: irregularly irregular; tachycardic Gastrointestinal: Soft, unable to assess tenderness; non-distended, no rebound/guarding. 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; patent; retracted, serosanguinous fluid in bag, Surgical drains in RUQ and RLQ with serosanguinous output Genitourinary: Foley in place  Integumentary: Midline wound, measuring 21 x 6 x 3 cm, Wound vac in place    Labs:  CBC Latest Ref Rng & Units  04/12/2021 04/11/2021 04/10/2021  WBC 4.0 - 10.5 K/uL 46.8(H) 38.9(H) 21.8(H)  Hemoglobin 12.0 - 15.0 g/dL 9.2(L) 10.1(L) 8.0(L)  Hematocrit 36.0 - 46.0 % 28.5(L) 31.2(L) 23.8(L)  Platelets 150 - 400 K/uL 72(L) 97(L) 120(L)   CMP Latest Ref Rng & Units 04/12/2021 04/11/2021 04/11/2021  Glucose 70 - 99 mg/dL 276(H) 229(H) 211(H)  BUN 8 - 23 mg/dL 15 14 17   Creatinine 0.44 - 1.00 mg/dL 0.68 0.69 0.78  Sodium 135 - 145 mmol/L 133(L) 135 134(L)  Potassium 3.5 - 5.1 mmol/L 4.1 3.8 3.9  Chloride 98 - 111 mmol/L 102 102 101  CO2 22 - 32 mmol/L 26 27 29   Calcium 8.9 - 10.3 mg/dL 7.2(L) 7.1(L) 7.4(L)  Total Protein 6.5 - 8.1 g/dL 4.2(L) - 4.5(L)  Total Bilirubin 0.3 - 1.2 mg/dL 0.7 - 0.7  Alkaline Phos 38 - 126 U/L 69 - 63  AST 15 - 41 U/L 14(L) - 16  ALT 0 - 44 U/L 14 - 19     Imaging studies: No new pertinent imaging studies   Assessment/Plan:  75 y.o. critically ill female equiring vasopressor and ventilator support 4 Days Post-Op s/p exploratory laparotomy, hartman's procedure, salpingo-oopherectomy, and placement of wound vac for perforated diverticulitis with frankly purulent peritonitis   - Again, I do believe she is deteriorating given her increased need for vasopressor support concomitant with her significant increase in leukocytosis to 46.8K and over the last 72 hours as well. I would recommend repeat imaging to ensure no missed intra-abdominal processes to explain these changes;  however, I do not think she is stable enough for imaging given her need for significant support and CRRT. At this point, I do think involving palliative care would be pertinent. She is critically ill and prognosis is overall grave with likely little chance of meaningful recovery. We do not have much more to offer from a surgical perspective at this time.    - Appreciate PCCM assistance; ventilator and vasopressor support pre their service - Appreciate nephrology assistance; CRRT - Continue TPN; NPO + IVF support -  Continue IV Abx (Zosyn); ID following - Monitor abdominal examination; on-going colostomy function              - Midline wound care: Wound vac; MWF schedule --> WOC will take over; appreciate their assistance             - Pain control prn - Monitor leukocytosis; worsening             - Monitor renal function; improved but without UO   All of the above findings and recommendations were discussed with the medical team   -- Edison Simon, PA-C [facility] 04/12/2021, 7:25 AM [phone] M-F: 7am - 4pm  Pt seen and examined. I agree w Mr Olean Ree [facility]. Critically ill, unsure if she will make it. Continue supportive care

## 2021-04-12 NOTE — Progress Notes (Signed)
NAME:  Lindsay [name], MRN:  [id number], DOB:  [birth year], LOS: 5 ADMISSION DATE:  05/05/2021    CC follow up severe resp failure and shock  HPI 75 year old lifelong never smoker with a history as noted below, who presented to [facility] today after a 5-day history of constant sharp and severe bilateral lower abdominal pain associated with daily episodes of nonbloody and nonbilious emesis.  The patient had not been able to take significant p.o. intake for approximately 5 days.  She endorsed to the emergency room physician that she had not made urine in 4 days PTA.  She presented to [facility] in the early hours of the morning feeling that she was in a pass out and feeling extremely weak.  She did not endorse any shortness of breath fevers or chills.  She stated that she was having bowel movements.  No constipation or diarrhea.  On evaluation at the emergency room the patient was noted to be tachypneic and described as being pale and sick appearing.  She was noted to have diffusely tender abdomen particular in the lower quadrants but described not to be having any rebound.  A CT abdomen and pelvis was obtained and this showed bowel perforation with marked inflammatory changes and severe pneumoperitoneum throughout the abdomen and pelvis.  Bowel ischemia could not be excluded.  Emergent surgical consultation was recommended.   This point Dr. Fredirick Maudlin was consulted emergently and the patient went emergently to the operating room.  Dr. Celine Ahr performed an exploratory laparotomy, mobilization of the splenic flexure, extensive adhesion lysis, low anterior resection, descending colostomy, left salpingo-oophorectomy and placement of a wound VAC.  Dr. Celine Ahr described that all upon entering the abdomen roughly 1.5 L of pus were released.  There was no obvious feculent drainage.  During the procedure a small splenic laceration occur during the exploration which was managed with topical hemostatic agents.  Patient also  had placement of multiple drains in the abdomen.  The patient had central line arterial line placed prior to surgery.  Patient received appropriate volume resuscitation with crystalloid and colloid and was transferred to the intensive care unit after the procedure and mechanically ventilated status.  She was at the time requiring pressors in the form of norepinephrine.  At that point PCCM was consulted for management of the patient's critical illness.   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  She has a history of A. fib and is on amiodarone and anticoagulation with Xarelto.  04/12/21- patient is critically ill on levophed, TPN, mechanical ventilation, dialysis, abdominal surgical wound with 2 JP drains actively draining, midline surgical wound ,  ostomy with active stooling, acrocyanosis, comatose in [place].  Plan to start PO feeds.   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 Fib Diverticulitis   Significant Hospital Events: Including procedures, antibiotic start and stop dates in addition to other pertinent events  04/28/2021 admitted post emergent ex lap for perforated viscus, peritonitis, severe sepsis 04/29/2021 piperacillin tazobactam and Eraxis initiated 7/4 remains critically ill, severe multiorgan failure 7/5 remains critically ill, severe shock, on CRRT 7/6 remains on vent, pressors and CRRT 7/7 remains on CRRT, on pressors, on vent 04/12/21-patient remains critically ill and is with multiple severe issues actively.        Interim History / Subjective:  Severe shock On CRRT On pressors +multiorgan failure   Antimicrobials:   Antibiotics Given (  last 72 hours)     Date/Time Action Medication Dose Rate   04/09/21 1317 New Bag/Given   piperacillin-tazobactam (ZOSYN) IVPB 3.375 g 3.375 g 100 mL/hr   04/09/21 2016 New Bag/Given   piperacillin-tazobactam (ZOSYN)  IVPB 3.375 g 3.375 g 100 mL/hr   04/10/21 0220 New Bag/Given   piperacillin-tazobactam (ZOSYN) IVPB 3.375 g 3.375 g 100 mL/hr   04/10/21 0734 New Bag/Given   piperacillin-tazobactam (ZOSYN) IVPB 3.375 g 3.375 g 100 mL/hr   04/10/21 1429 New Bag/Given   piperacillin-tazobactam (ZOSYN) IVPB 3.375 g 3.375 g 100 mL/hr   04/10/21 1931 New Bag/Given   piperacillin-tazobactam (ZOSYN) IVPB 3.375 g 3.375 g 100 mL/hr   04/11/21 0101 New Bag/Given   piperacillin-tazobactam (ZOSYN) IVPB 3.375 g 3.375 g 100 mL/hr   04/11/21 0855 New Bag/Given   piperacillin-tazobactam (ZOSYN) IVPB 3.375 g 3.375 g 100 mL/hr   04/11/21 1411 New Bag/Given   piperacillin-tazobactam (ZOSYN) IVPB 3.375 g 3.375 g 100 mL/hr   04/11/21 1922 New Bag/Given   piperacillin-tazobactam (ZOSYN) IVPB 3.375 g 3.375 g 100 mL/hr   04/12/21 0104 New Bag/Given   piperacillin-tazobactam (ZOSYN) IVPB 3.375 g 3.375 g 100 mL/hr   04/12/21 0805 New Bag/Given   piperacillin-tazobactam (ZOSYN) IVPB 3.375 g 3.375 g 100 mL/hr               Objective   Blood pressure 110/71, pulse (!) 112, temperature (!) 95.36 F (35.2 C), resp. rate 20, height 5\' 5"  (1.651 m), weight 79.1 kg, SpO2 98 %. CVP:  [2 mmHg-5 mmHg] 2 mmHg  Vent Mode: PRVC FiO2 (%):  [24 %] 24 % Set Rate:  [20 bmp] 20 bmp Vt Set:  [450 mL] 450 mL PEEP:  [5 cmH20] 5 cmH20 Plateau Pressure:  [20 cmH20] 20 cmH20   Intake/Output Summary (Last 24 hours) at 04/12/2021 0932 Last data filed at 04/12/2021 0900 Gross per 24 hour  Intake 8132.02 ml  Output 4987 ml  Net 3145.02 ml    Filed Weights   04/10/21 0333 04/11/21 0320 04/12/21 0318  Weight: 78.6 kg 75.2 kg 79.1 kg      REVIEW OF SYSTEMS  PATIENT IS UNABLE TO PROVIDE COMPLETE REVIEW OF SYSTEMS DUE TO SEVERE CRITICAL ILLNESS AND TOXIC METABOLIC ENCEPHALOPATHY   PHYSICAL EXAMINATION:  GENERAL:critically ill appearing, +resp distress HEAD: Normocephalic, atraumatic.  EYES: Pupils equal, round, reactive to  light.  No scleral icterus.  MOUTH: Moist mucosal membrane. NECK: Supple. PULMONARY: +rhonchi, +wheezing CARDIOVASCULAR: S1 and S2. Regular rate and rhythm. No murmurs, rubs, or gallops.  GASTROINTESTINAL:distended +drains MUSCULOSKELETAL: No swelling, clubbing, or edema.  NEUROLOGIC: obtunded SKIN:intact,warm,dry   Labs/imaging that I havepersonally reviewed  (right click and "Reselect all SmartList Selections" daily)       ASSESSMENT AND PLAN SYNOPSIS   75 yo female with severe bowel perforation SEPSIS PRESENT ON ADMISSION and perforated ABD with ABD abscess with severe septic shock s/p ex lap with progressive multiorgan failure  Severe ACUTE Hypoxic and Hypercapnic Respiratory Failure -continue Mechanical Ventilator support -continue Bronchodilator Therapy -Wean Fio2 and PEEP as tolerated -VAP/VENT bundle implementation Unable to wean from vent, shock  Vent Mode: PRVC FiO2 (%):  [24 %] 24 % Set Rate:  [20 bmp] 20 bmp Vt Set:  [450 mL] 450 mL PEEP:  [5 cmH20] 5 cmH20 Plateau Pressure:  [20 cmH20] 20 cmH20   CARDIAC FAILURE-acute MAT, Afib -oxygen as needed -Lasix as tolerated follow up cardiac enzymes as indicated On AMIO Follow up cardiology recs   CARDIAC ICU monitoring  ACUTE KIDNEY INJURY/Renal Failure -continue Foley Catheter-assess need -Avoid nephrotoxic agents -Follow urine output, BMP -Ensure adequate renal perfusion, optimize oxygenation -Renal dose medications On CRRT   Intake/Output Summary (Last 24 hours) at 04/12/2021 0932 Last data filed at 04/12/2021 0900 Gross per 24 hour  Intake 8132.02 ml  Output 4987 ml  Net 3145.02 ml      NEUROLOGY Acute toxic metabolic encephalopathy, need for sedation Goal RASS -2 to -3   SEPTIC SHOCK SOURCE-ABD bowel perf -use vasopressors to keep MAP>65 as needed -follow ABG and LA -follow up cultures -emperic ABX -consider stress dose steroids -aggressive IV fluid resuscitation  INFECTIOUS  DISEASE Peritonitis with severe sepsis Bowel perforation Severe sepsis -continue antibiotics as prescribed -follow up cultures -follow up ID consultation  ENDO - ICU hypoglycemic\Hyperglycemia protocol -check FSBS per protocol   GI GI PROPHYLAXIS as indicated  NUTRITIONAL STATUS DIET-->TPN Constipation protocol as indicated   ELECTROLYTES -follow labs as needed -replace as needed -pharmacy consultation and following   Best practice (right click and "Reselect all SmartList Selections" daily)  Diet:  NPO Pain/Anxiety/Delirium protocol (if indicated): Yes (RASS goal -2) VAP protocol (if indicated): Yes DVT prophylaxis: Subcutaneous Heparin GI prophylaxis: PPI Glucose control:  SSI Yes Central venous access:  Yes, and it is still needed Arterial line:  Yes, and it is still needed Foley:  Yes, and it is still needed Mobility:  bed rest  PT consulted: N/A Code Status:  DNR Disposition: ICU   Labs   CBC: Recent Labs  Lab 04/22/2021 1256 04/27/2021 1651 04/08/21 0307 04/09/21 0521 04/10/21 0312 04/11/21 0404 04/12/21 0403  WBC 14.9*  --  21.3* 19.3* 21.8* 38.9* 46.8*  NEUTROABS 14.5*  --   --  18.5* 20.4*  --  42.6*  HGB 6.7*   < > 9.0* 8.3* 8.0* 10.1* 9.2*  HCT 21.3*   < > 26.5* 24.2* 23.8* 31.2* 28.5*  MCV 79.8*  --  81.8 82.9 83.2 86.9 86.4  PLT 266  --  167 131* 120* 97* 72*   < > = values in this interval not displayed.     Basic Metabolic Panel: Recent Labs  Lab 04/08/21 0307 04/08/21 1650 04/09/21 0521 04/09/21 1601 04/10/21 0312 04/10/21 1500 04/11/21 0404 04/11/21 1616 04/12/21 0403  NA 132*   < > 137  135   < > 134* 135 134*  135 135 133*  K 3.8   < > 3.9  3.8   < > 3.6 3.4* 3.9  3.9 3.8 4.1  CL 101   < > 103  102   < > 104 104 101  101 102 102  CO2 19*   < > 27  27   < > 27 26 29  28 27 26   GLUCOSE 233*   < > 233*  238*   < > 237* 266* 211*  210* 229* 276*  BUN 71*   < > 40*  39*   < > 20 18 17  17 14 15   CREATININE 4.24*    < > 2.18*  2.33*   < > 1.09* 0.93 0.78  0.78 0.69 0.68  CALCIUM 7.2*   < > 7.3*  7.2*   < > 7.3* 7.2* 7.4*  7.3* 7.1* 7.2*  MG 1.9  --  2.3  --  2.3  --  2.3  --  2.3  PHOS 7.4*   < > 3.3  3.3   < > 1.6* 2.7 2.2*  2.2* 2.6 2.9   < > =  values in this interval not displayed.    GFR: Estimated Creatinine Clearance: 63.1 mL/min (by C-G formula based on SCr of 0.68 mg/dL). Recent Labs  Lab 04/23/2021 0726 04/11/2021 1256 04/08/21 0307 04/09/21 0521 04/09/21 1124 04/10/21 0312 04/11/21 0404 04/12/21 0403  PROCALCITON  --  15.49 22.12 11.60  --   --   --   --   WBC  --  14.9* 21.3* 19.3*  --  21.8* 38.9* 46.8*  LATICACIDVEN 2.9* 2.9* 1.4  --  1.3  --   --   --      Liver Function Tests: Recent Labs  Lab 04/05/2021 0348 05/05/2021 1256 04/08/21 0307 04/09/21 0521 04/09/21 1601 04/10/21 1500 04/11/21 0404 04/11/21 1616 04/12/21 0403 04/12/21 0536  AST 56* 42*  --  38  --   --  16  --   --  14*  ALT 41 21  --  31  --   --  19  --   --  14  ALKPHOS 70 28*  --  44  --   --  63  --   --  69  BILITOT 0.9 1.4*  --  1.3*  --   --  0.7  --   --  0.7  PROT 6.6 3.8*  --  4.3*  --   --  4.5*  --   --  4.2*  ALBUMIN 2.6* 2.3*   < > 2.0*  2.0*   < > 1.7* 1.7*  1.8* 1.8* 1.7* 1.7*   < > = values in this interval not displayed.    Recent Labs  Lab 04/13/2021 0348  LIPASE 41    No results for input(s): AMMONIA in the last 168 hours.  ABG    Component Value Date/Time   PHART 7.38 04/09/2021 0341   PCO2ART 40 04/09/2021 0341   PO2ART 115 (H) 04/09/2021 0341   HCO3 23.7 04/09/2021 0341   ACIDBASEDEF 1.3 04/09/2021 0341   O2SAT 98.4 04/09/2021 0341      Coagulation Profile: Recent Labs  Lab 04/15/2021 1256 04/12/21 0536  INR 3.3* 1.4*     Cardiac Enzymes: No results for input(s): CKTOTAL, CKMB, CKMBINDEX, TROPONINI in the last 168 hours.  HbA1C: Hgb A1c MFr Bld  Date/Time Value Ref Range Status  04/08/2021 04:51 PM 5.7 (H) 4.8 - 5.6 % Final    Comment:    (NOTE)          Prediabetes: 5.7 - 6.4         Diabetes: >6.4         Glycemic control for adults with diabetes: <7.0     CBG: Recent Labs  Lab 04/11/21 1614 04/11/21 1905 04/11/21 2320 04/12/21 0323 04/12/21 0724  GLUCAP 213* 114* 174* 232* 195*     Allergies Allergies  Allergen Reactions   Amiodarone     Other reaction(s): Other (See Comments) Nausea, diarrhea, weight loss, anxiety, jitteriness   Levofloxacin Rash    Red palms, lips, forearms    Meloxicam Shortness Of Breath    Other reaction(s): Respiratory Distress   Amlodipine     Other reaction(s): Other (See Comments) Caused severe gingival hyperplasia   Amlodipine Besy-Benazepril Hcl     Severe gingival hyperplasia  Other reaction(s): Other (See Comments) Other reaction(s): Other   Beta Adrenergic Blockers     States severe fatigue  Other reaction(s): Other (See Comments) fatigue        DVT/GI PRX  assessed I Assessed the need for Labs I Assessed  the need for Foley I Assessed the need for Central Venous Line Family Discussion when available I Assessed the need for Mobilization I made an Assessment of medications to be adjusted accordingly Safety Risk assessment completed  CASE DISCUSSED IN MULTIDISCIPLINARY ROUNDS WITH ICU TEAM     Critical Care Time devoted to patient care services described in this note is 33 minutes.  Critical care was necessary to treat or prevent imminent or life-threatening deterioration.   PATIENT WITH VERY POOR PROGNOSIS I ANTICIPATE PROLONGED ICU LOS  Patient with Multiorgan failure and at high risk for cardiac arrest and death.     Ottie Glazier, M.D.  Pulmonary & [place]

## 2021-04-13 DIAGNOSIS — K631 Perforation of intestine (nontraumatic): Secondary | ICD-10-CM | POA: Diagnosis not present

## 2021-04-13 LAB — RENAL FUNCTION PANEL
Albumin: 1.7 g/dL — ABNORMAL LOW (ref 3.5–5.0)
Albumin: 2.1 g/dL — ABNORMAL LOW (ref 3.5–5.0)
Anion gap: 2 — ABNORMAL LOW (ref 5–15)
Anion gap: 5 (ref 5–15)
BUN: 21 mg/dL (ref 8–23)
BUN: 22 mg/dL (ref 8–23)
CO2: 24 mmol/L (ref 22–32)
CO2: 26 mmol/L (ref 22–32)
Calcium: 7.5 mg/dL — ABNORMAL LOW (ref 8.9–10.3)
Calcium: 7.5 mg/dL — ABNORMAL LOW (ref 8.9–10.3)
Chloride: 104 mmol/L (ref 98–111)
Chloride: 104 mmol/L (ref 98–111)
Creatinine, Ser: 0.83 mg/dL (ref 0.44–1.00)
Creatinine, Ser: 0.63 mg/dL (ref 0.44–1.00)
GFR, Estimated: >60 mL/min (ref >60)
GFR, Estimated: >60 mL/min (ref >60)
Glucose, Bld: 204 mg/dL — ABNORMAL HIGH (ref 70–99)
Glucose, Bld: 209 mg/dL — ABNORMAL HIGH (ref 70–99)
Phosphorus: 2.9 mg/dL (ref 2.5–4.6)
Phosphorus: 1.8 mg/dL — ABNORMAL LOW (ref 2.5–4.6)
Potassium: 4.6 mmol/L (ref 3.5–5.1)
Potassium: 4 mmol/L (ref 3.5–5.1)
Sodium: 130 mmol/L — ABNORMAL LOW (ref 135–145)
Sodium: 135 mmol/L (ref 135–145)

## 2021-04-13 LAB — CBC
HCT: 23.4 % — ABNORMAL LOW (ref 36.0–46.0)
Hemoglobin: 7.6 g/dL — ABNORMAL LOW (ref 12.0–15.0)
MCH: 28.1 pg (ref 26.0–34.0)
MCHC: 32.5 g/dL (ref 30.0–36.0)
MCV: 86.7 fL (ref 80.0–100.0)
Platelets: 59 10*3/uL — ABNORMAL LOW (ref 150–400)
RBC: 2.7 MIL/uL — ABNORMAL LOW (ref 3.87–5.11)
RDW: 20.3 % — ABNORMAL HIGH (ref 11.5–15.5)
WBC: 33.4 10*3/uL — ABNORMAL HIGH (ref 4.0–10.5)
nRBC: 0.3 % — ABNORMAL HIGH (ref 0.0–0.2)

## 2021-04-13 LAB — MAGNESIUM: Magnesium: 2.3 mg/dL (ref 1.7–2.4)

## 2021-04-13 LAB — GLUCOSE, CAPILLARY
Glucose-Capillary: 173 mg/dL — ABNORMAL HIGH (ref 70–99)
Glucose-Capillary: 121 mg/dL — ABNORMAL HIGH (ref 70–99)
Glucose-Capillary: 194 mg/dL — ABNORMAL HIGH (ref 70–99)
Glucose-Capillary: 191 mg/dL — ABNORMAL HIGH (ref 70–99)
Glucose-Capillary: 161 mg/dL — ABNORMAL HIGH (ref 70–99)
Glucose-Capillary: 176 mg/dL — ABNORMAL HIGH (ref 70–99)

## 2021-04-13 LAB — PHOSPHORUS: Phosphorus: 2.9 mg/dL (ref 2.5–4.6)

## 2021-04-13 MED ORDER — LABETALOL HCL 5 MG/ML IV SOLN
5.0000 mg | Freq: Once | INTRAVENOUS | Status: AC
  Administered 2021-04-13: 5 mg via INTRAVENOUS
  Filled 2021-04-13: qty 4

## 2021-04-13 MED ORDER — PIVOT 1.5 CAL PO LIQD
1000.0000 mL | ORAL | Status: DC
  Administered 2021-04-13: 1000 mL
  Filled 2021-04-13: qty 1000

## 2021-04-13 MED ORDER — PIVOT 1.5 CAL PO LIQD
1000.0000 mL | ORAL | Status: DC
  Filled 2021-04-13: qty 1000

## 2021-04-13 MED ORDER — MIDODRINE HCL 5 MG PO TABS
10.0000 mg | ORAL_TABLET | Freq: Three times a day (TID) | ORAL | Status: DC
  Administered 2021-04-13: 10 mg via ORAL

## 2021-04-13 MED ORDER — FREE WATER
30.0000 mL | Status: DC
  Administered 2021-04-13 – 2021-04-20 (×41): 30 mL

## 2021-04-13 MED ORDER — FOLIC ACID 5 MG/ML IJ SOLN
INTRAMUSCULAR | Status: AC
  Filled 2021-04-13: qty 1176

## 2021-04-13 MED ORDER — MIDODRINE HCL 5 MG PO TABS
10.0000 mg | ORAL_TABLET | Freq: Three times a day (TID) | ORAL | Status: DC
  Administered 2021-04-13 (×2): 10 mg
  Filled 2021-04-13 (×2): qty 2

## 2021-04-13 NOTE — Progress Notes (Signed)
ID Pt remain intubated Off pressors On CRRT BP (!) 155/64   Pulse 83   Temp (!) 93.92 F (34.4 C)   Resp 18   Ht 5\' 5"  (1.651 m)   Wt 81.7 kg Comment: SCD pump and wound vac removed  SpO2 97%   BMI 29.97 kg/m   Intubated on the vent B/l air entry HS tachycardia Left IJ line RT IJ line Left radial Abd  Wound vac 2 JP drains Colostomy Foley  Labs . CBC Latest Ref Rng & Units 04/13/2021 04/12/2021 04/11/2021  WBC 4.0 - 10.5 K/uL 33.4(H) 46.8(H) 38.9(H)  Hemoglobin 12.0 - 15.0 g/dL 7.6(L) 9.2(L) 10.1(L)  Hematocrit 36.0 - 46.0 % 23.4(L) 28.5(L) 31.2(L)  Platelets 150 - 400 K/uL 59(L) 72(L) 97(L)    CMP Latest Ref Rng & Units 04/13/2021 04/13/2021 04/12/2021  Glucose 70 - 99 mg/dL 209(H) 204(H) 266(H)  BUN 8 - 23 mg/dL 22 21 15   Creatinine 0.44 - 1.00 mg/dL 0.63 0.83 0.75  Sodium 135 - 145 mmol/L 135 130(L) 130(L)  Potassium 3.5 - 5.1 mmol/L 4.0 4.6 4.2  Chloride 98 - 111 mmol/L 104 104 100  CO2 22 - 32 mmol/L 26 24 26   Calcium 8.9 - 10.3 mg/dL 7.5(L) 7.5(L) 7.4(L)  Total Protein 6.5 - 8.1 g/dL - - -  Total Bilirubin 0.3 - 1.2 mg/dL - - -  Alkaline Phos 38 - 126 U/L - - -  AST 15 - 41 U/L - - -  ALT 0 - 44 U/L - - -    Micro Culture from JP drain shows Klebsiella which is sensitive to Zosyn.  Impression/recommendation Perforated diverticulitis with intra-abdominal abscess.  Pneumoperitoneum.  Underwent expiratory laparotomy and had Hartman's procedure with colostomy now.  Multiorgan failure. She is off pressors this morning  Worsening leukocytosis is improving.  Today it is 14. 1 she is stable she needs CT abdomen to look for abscess, dehiscence. Patient is currently on meropenem and fluconazole.   Acute respiratory failure intubated  AKI due to septic shock causing ATN.  On CRRT.  Discussed the management with the intensivist.

## 2021-04-13 NOTE — Progress Notes (Signed)
Patient rinsed back at approximately 2335 for TMP pressures 290-298. Unable to prime new cartridge due to effluent scale errors. Machine switched out and CRRT resumed at this time. Bair hugger on standby for rectal temp of 98.1. Will continue to monitor.

## 2021-04-13 NOTE — Progress Notes (Signed)
POD # 6 s/p Hartmanns for perforated viscus septic shock Critically ill and septic On vasopressin hemodynamics still a problem Intubated AKI that requiring CVVHD Elevated wbc Tolerating TF trophic rate    PE on the vent sedated Abd: ostomy retracted but has improved, no peritonitis, vac in place, drain serous   A/P Guarded prognosis We will continue to follow Once more stable needs CT a/p to check for intra-abdominal abscess May advance tube feeds slowly Continue A/Bs and  Supportive care

## 2021-04-13 NOTE — Progress Notes (Signed)
PHARMACY - TOTAL PARENTERAL NUTRITION CONSULT NOTE   Indication:  extensive bowel surgery, poor PO intake PTA  Patient Measurements: Height: 5\' 5"  (165.1 cm) Weight: 81.7 kg (180 lb 1.9 oz) (SCD pump and wound vac removed) IBW/kg (Calculated) : 57 TPN AdjBW (KG): 63.5 Body mass index is 29.97 kg/m.  Assessment: 75 year old female with minimal PO intake 5 days prior to admission. Imaging showed bowel perforation; patient was taken to the OR for exploratory laparotomy with low anterior resection and colostomy placement. Patient is intubated and sedated in the ICU. Required vasopressors, which have been titrated down. Episode of atrial fibrillation with RVR 7/4, started on amiodarone infusion. Patient has h/o afib s/p ablation on Xarelto; unable to anticoagulate at this time s/t recent surgery. AKI with decreased UOP prior to hospitalization; started on CRRT 7/4.  Glucose / Insulin: on sensitive SSI 24 hr glucose range 121-204 24 hr insulin requirements 43u Electrolytes: within normal limits Renal: Started on CRRT 7/4 Intake / Output: net (+) 15.5 L GI Imaging: 7/3 CT abdomen/pelvis: Bowel perforation with marked inflammatory changes and pneumoperitoneum throughout the abdomen and pelvis. Bowel ischemia not excluded. GI Surgeries / Procedures:  7/3 ex lap with low anterior resection and colostomy placement  Central access: CVC right IJ TPN start date: 7/4  Nutritional Goals (per RD recommendation on 7/7): kCal: 1455 kcal/day, Protein: 110-125 g, Fluid: 1.4-1.7 L/day Goal TPN rate is 70 mL/hr (provides 117 g of protein and 1434 kcals per day)  Propofol stopped 7/8 Pivot 1.5 cal at 15 mL/hr started 7/8  Current Nutrition: NPO  Plan:  --Continue TPN at 70 mL/hr. Nutritional adjustments made per updated RD recommendations Contains 117 g protein, 184 g dextrose, 33.6 g lipids. Total Kcal 1434. Total volume 1680 mL --Electrolytes in TPN: Na 50 mEq/L, K 5 mEq/L, Ca 5 mEq/L, Mg 5 mEq/L,  and Phos 15 mmol/L. Cl:Ac 1:1 Patient started on CRRT 7/4 Potassium in TPN minimal (8 mEq total) to maintain Cl:Ac ratio. Potassium will be managed via CRRT fluid Phosphorus currently stable. Continue to monitor while on CRRT --Add standard MVI and trace elements to TPN. Remove chromium for decreased renal function. --Continue Novolog 2 units q4h + resistant SSI q4h. Monitor for necessary adjustments. --Monitor TPN labs on Mon/Thurs at minimum. Currently ordered BID while patient is on CRRT  Tawnya Crook, PharmD 04/13/2021,9:22 AM

## 2021-04-13 NOTE — Plan of Care (Signed)
  Problem: Health Behavior/Discharge Planning: Goal: Ability to manage health-related needs will improve Outcome: Progressing   Problem: Clinical Measurements: Goal: Ability to maintain clinical measurements within normal limits will improve Outcome: Progressing Goal: Will remain free from infection Outcome: Progressing Goal: Diagnostic test results will improve Outcome: Progressing Goal: Respiratory complications will improve Outcome: Progressing Goal: Cardiovascular complication will be avoided Outcome: Progressing   Problem: Activity: Goal: Risk for activity intolerance will decrease Outcome: Progressing   Problem: Nutrition: Goal: Adequate nutrition will be maintained Outcome: Progressing   Problem: Elimination: Goal: Will not experience complications related to bowel motility Outcome: Progressing   Problem: Pain Managment: Goal: General experience of comfort will improve Outcome: Progressing   Problem: Skin Integrity: Goal: Risk for impaired skin integrity will decrease Outcome: Progressing

## 2021-04-13 NOTE — Plan of Care (Signed)
Pt turned Q2H this shift, all pressors currently off, sedation minimal, patient open eyes and raises eyebrows when her name is called, will usually nod or shake her head appropriately to questions, was able to squeeze hand when asked to, did not follow motor commands for feet.  CRRT running w/no issues.  No UOP to speak of, no measurable NWPT, JP drain, or colostomy output this shift.  Small mark on left buttock from rectal temp probe observed during bath, probe repositioned.  Small lesion on left lower lip remains scabbed, dry.  Oral care performed Q2H as well as eye drops administered d/t patient usually keeping her eyes and her mouth open.  Husband at bedside at this time.  POC explained and all questions answered.  He verbalizes understanding of POC and states his wife would be very reluctant to pursue a trach or PEG.

## 2021-04-13 NOTE — Progress Notes (Signed)
[facility]  ROUNDING NOTE   Subjective:   On CRRT.  Tmin 96.3 UF 3.7 liters. Net +1144m  Wbc 33.4 (46.8)  Norepinephrine gtt Amiodarone gtt TPN  Placed on midodrine IV albumin   Objective:  Vital signs in last 24 hours:  Temp:  [93.6 F (34.2 C)-98.1 F (36.7 C)] 94.46 F (34.7 C) (07/09 0900) Pulse Rate:  [36-118] 88 (07/09 0900) Resp:  [16-24] 20 (07/09 0800) BP: (93-165)/(45-93) 129/68 (07/09 0800) SpO2:  [91 %-100 %] 92 % (07/09 0900) Arterial Line BP: (91-192)/(39-96) 96/74 (07/09 0900) FiO2 (%):  [24 %] 24 % (07/09 0831) Weight:  [81.7 kg] 81.7 kg (07/09 0500)  Weight change: 2.6 kg Filed Weights   04/11/21 0320 04/12/21 0318 04/13/21 0500  Weight: 75.2 kg 79.1 kg 81.7 kg    Intake/Output: I/O last 3 completed shifts: In: 7118.7 [I.V.:5549.5; Other:370; NG/GT:265; IV Piggyback:934.1] Out: 6142 [Emesis/NG output:150; Drains:60; OIWOEH:2122]  Intake/Output this shift:  Total I/O In: 219.1 [I.V.:114.1; NG/GT:105] Out: 189 [Other:189]  Physical Exam: General: Critically ill  Head: ETT  Eyes: Anicteric  Neck: Supple  Lungs:  PRVC fiO2 24%  Heart: irregular  Abdomen:  +midline incision  Extremities: 1+ upper extremity edema  Neurologic: Intubated, sedated  Skin: No acute rash  Access: Left IJ temporary dialysis catheter    Basic Metabolic Panel: Recent Labs  Lab 04/09/21 0521 04/09/21 1601 04/10/21 0312 04/10/21 1500 04/11/21 0404 04/11/21 1616 04/12/21 0403 04/12/21 1704 04/13/21 0432  NA 137  135   < > 134*   < > 134*  135 135 133* 130* 130*  K 3.9  3.8   < > 3.6   < > 3.9  3.9 3.8 4.1 4.2 4.6  CL 103  102   < > 104   < > 101  101 102 102 100 104  CO2 27  27   < > 27   < > '29  28 27 26 26 24  ' GLUCOSE 233*  238*   < > 237*   < > 211*  210* 229* 276* 266* 204*  BUN 40*  39*   < > 20   < > '17  17 14 15 15 21  ' CREATININE 2.18*  2.33*   < > 1.09*   < > 0.78  0.78 0.69 0.68 0.75 0.83  CALCIUM 7.3*  7.2*   < >  7.3*   < > 7.4*  7.3* 7.1* 7.2* 7.4* 7.5*  MG 2.3  --  2.3  --  2.3  --  2.3  --  2.3  PHOS 3.3  3.3   < > 1.6*   < > 2.2*  2.2* 2.6 2.9 2.7 2.9  2.9   < > = values in this interval not displayed.     Liver Function Tests: Recent Labs  Lab 04/10/2021 0348 04/27/2021 1256 04/08/21 0307 04/09/21 0521 04/09/21 1601 04/11/21 0404 04/11/21 1616 04/12/21 0403 04/12/21 0536 04/12/21 1704 04/13/21 0432  AST 56* 42*  --  38  --  16  --   --  14*  --   --   ALT 41 21  --  31  --  19  --   --  14  --   --   ALKPHOS 70 28*  --  44  --  63  --   --  69  --   --   BILITOT 0.9 1.4*  --  1.3*  --  0.7  --   --  0.7  --   --   PROT 6.6 3.8*  --  4.3*  --  4.5*  --   --  4.2*  --   --   ALBUMIN 2.6* 2.3*   < > 2.0*  2.0*   < > 1.7*  1.8* 1.8* 1.7* 1.7* 1.8* 1.7*   < > = values in this interval not displayed.    Recent Labs  Lab 04/21/2021 0348  LIPASE 41    No results for input(s): AMMONIA in the last 168 hours.  CBC: Recent Labs  Lab 04/28/2021 1256 04/11/2021 1651 04/09/21 0521 04/10/21 0312 04/11/21 0404 04/12/21 0403 04/13/21 0432  WBC 14.9*   < > 19.3* 21.8* 38.9* 46.8* 33.4*  NEUTROABS 14.5*  --  18.5* 20.4*  --  42.6*  --   HGB 6.7*   < > 8.3* 8.0* 10.1* 9.2* 7.6*  HCT 21.3*   < > 24.2* 23.8* 31.2* 28.5* 23.4*  MCV 79.8*   < > 82.9 83.2 86.9 86.4 86.7  PLT 266   < > 131* 120* 97* 72* 59*   < > = values in this interval not displayed.     Cardiac Enzymes: No results for input(s): CKTOTAL, CKMB, CKMBINDEX, TROPONINI in the last 168 hours.  BNP: Invalid input(s): POCBNP  CBG: Recent Labs  Lab 04/12/21 1607 04/12/21 1940 04/12/21 2333 04/13/21 0319 04/13/21 0739  GLUCAP 204* 148* 188* 173* 121*     Microbiology: Results for orders placed or performed during the hospital encounter of 04/26/2021  Resp Panel by RT-PCR (Flu A&B, Covid) Nasopharyngeal Swab     Status: None   Collection Time: 04/09/2021  4:40 AM   Specimen: Nasopharyngeal Swab; Nasopharyngeal(NP)  swabs in vial transport medium  Result Value Ref Range Status   SARS Coronavirus 2 by RT PCR NEGATIVE NEGATIVE Final    Comment: (NOTE) SARS-CoV-2 target nucleic acids are NOT DETECTED.  The SARS-CoV-2 RNA is generally detectable in upper respiratory specimens during the acute phase of infection. The lowest concentration of SARS-CoV-2 viral copies this assay can detect is 138 copies/mL. A negative result does not preclude SARS-Cov-2 infection and should not be used as the sole basis for treatment or other patient management decisions. A negative result may occur with  improper specimen collection/handling, submission of specimen other than nasopharyngeal swab, presence of viral mutation(s) within the areas targeted by this assay, and inadequate number of viral copies(<138 copies/mL). A negative result must be combined with clinical observations, patient history, and epidemiological information. The expected result is Negative.  Fact Sheet for Patients:  EntrepreneurPulse.com.au  Fact Sheet for Healthcare Providers:  IncredibleEmployment.be  This test is no t yet approved or cleared by the [place] FDA and  has been authorized for detection and/or diagnosis of SARS-CoV-2 by FDA under an Emergency Use Authorization (EUA). This EUA will remain  in effect (meaning this test can be used) for the duration of the COVID-19 declaration under Section 564(b)(1) of the Act, 21 U.S.C.section 360bbb-3(b)(1), unless the authorization is terminated  or revoked sooner.       Influenza A by PCR NEGATIVE NEGATIVE Final   Influenza B by PCR NEGATIVE NEGATIVE Final    Comment: (NOTE) The Xpert Xpress SARS-CoV-2/FLU/RSV plus assay is intended as an aid in the diagnosis of influenza from Nasopharyngeal swab specimens and should not be used as a sole basis for treatment. Nasal washings and aspirates are unacceptable for Xpert Xpress  SARS-CoV-2/FLU/RSV testing.  Fact Sheet for Patients: EntrepreneurPulse.com.au  Fact  Sheet for Healthcare Providers: IncredibleEmployment.be  This test is not yet approved or cleared by the [place] and has been authorized for detection and/or diagnosis of SARS-CoV-2 by FDA under an Emergency Use Authorization (EUA). This EUA will remain in effect (meaning this test can be used) for the duration of the COVID-19 declaration under Section 564(b)(1) of the Act, 21 U.S.C. section 360bbb-3(b)(1), unless the authorization is terminated or revoked.  Performed at [facility], [address] [place], [place] [address]   Blood culture (routine x 2)     Status: None   Collection Time: 05/02/2021  5:33 AM   Specimen: BLOOD  Result Value Ref Range Status   Specimen Description BLOOD LEFT ANTECUBITAL  Final   Special Requests   Final    BOTTLES DRAWN AEROBIC AND ANAEROBIC Blood Culture adequate volume   Culture   Final    NO GROWTH 5 DAYS Performed at [facility], [address], [place], [place] [address]    Report Status 04/12/2021 FINAL  Final  Blood culture (routine x 2)     Status: None   Collection Time: 04/09/2021  7:40 AM   Specimen: BLOOD  Result Value Ref Range Status   Specimen Description BLOOD BLOOD RIGHT WRIST  Final   Special Requests   Final    BOTTLES DRAWN AEROBIC AND ANAEROBIC Blood Culture adequate volume   Culture   Final    NO GROWTH 5 DAYS Performed at [facility], [address], [place], [place] [address]    Report Status 04/12/2021 FINAL  Final  MRSA Next Gen by PCR, Nasal     Status: None   Collection Time: 04/12/2021 12:50 PM   Specimen: Nasal Mucosa; Nasal Swab  Result Value Ref Range Status   MRSA by PCR Next Gen NOT DETECTED NOT DETECTED Final    Comment: (NOTE) The GeneXpert MRSA Assay (FDA approved for NASAL specimens only), is one component of a comprehensive MRSA  colonization surveillance program. It is not intended to diagnose MRSA infection nor to guide or monitor treatment for MRSA infections. Test performance is not FDA approved in patients less than 78 years old. Performed at [facility], [address] [place], [place] [address]   Body fluid culture w Gram Stain     Status: None (Preliminary result)   Collection Time: 04/10/21  4:30 PM   Specimen: JP Drain; Body Fluid  Result Value Ref Range Status   Specimen Description   Final    JP DRAINAGE Performed at [facility], [address] [place], [place] [address]    Special Requests   Final    Normal Performed at [facility], [address] [place], [place] [address]    Gram Stain   Final    ABUNDANT WBC PRESENT, PREDOMINANTLY PMN NO ORGANISMS SEEN    Culture   Final    RARE KLEBSIELLA PNEUMONIAE SUSCEPTIBILITIES TO FOLLOW CRITICAL RESULT CALLED TO, READ BACK BY AND VERIFIED WITH: RN E.JENON AT 0945 ON 04/11/2021 BY T.SAAD. Performed at [facility], [place] [address] [place], [place] [address]    Report Status PENDING  Incomplete    Coagulation Studies: Recent Labs    04/12/21 0536  LABPROT 17.4*  INR 1.4*     Urinalysis: No results for input(s): COLORURINE, LABSPEC, PHURINE, GLUCOSEU, HGBUR, BILIRUBINUR, KETONESUR, PROTEINUR, UROBILINOGEN, NITRITE, LEUKOCYTESUR in the last 72 hours.  Invalid input(s): APPERANCEUR     Imaging: No results found.  Medications:     prismasol BGK 4/2.5 500 mL/hr at 04/12/21 2321    prismasol BGK 4/2.5 500 mL/hr at 04/13/21 0919   albumin human 25 g (04/13/21 0812)   amiodarone 30.06 mg/hr (04/12/21 2100)   dexmedetomidine (PRECEDEX) IV infusion 0.8 mcg/kg/hr (04/13/21 0920)   fentaNYL infusion INTRAVENOUS 150 mcg/hr (04/13/21 0800)   fluconazole (DIFLUCAN) IV Stopped (04/12/21 2108)   meropenem (MERREM) IV Stopped (04/13/21 1975)   norepinephrine (LEVOPHED) Adult infusion 0.75 mcg/min  (04/13/21 0800)   prismasol BGK 4/2.5 2,000 mL/hr at 04/13/21 0657   TPN ADULT (ION) 70 mL/hr at 04/13/21 0800   TPN ADULT (ION)     vasopressin 0.04 Units/min (04/13/21 0800)    amiodarone  400 mg Per NG tube BID   chlorhexidine gluconate (MEDLINE KIT)  15 mL Mouth Rinse BID   Chlorhexidine Gluconate Cloth  6 each Topical Q0600   feeding supplement (PIVOT 1.5 CAL)  1,000 mL Per Tube Q24H   free water  30 mL Per Tube Q4H   hydrocortisone sod succinate (SOLU-CORTEF) inj  100 mg Intravenous Q12H   insulin aspart  0-20 Units Subcutaneous Q4H   insulin aspart  2 Units Subcutaneous Q4H   mouth rinse  15 mL Mouth Rinse 10 times per day   midodrine  10 mg Per Tube TID WC   pantoprazole (PROTONIX) IV  40 mg Intravenous Daily   albuterol, fentaNYL, heparin sodium (porcine), midazolam, polyvinyl alcohol  Assessment/ Plan:   Ms. Lindsay [name] is a 75 y.o. white female with hypertrophic cardiomyopathy, ventricular tachycardia status post AICD placement, atrial fibrillation on rivaroxaban, diabetes mellitus type II, hypertension, history of diverticulitis, osteopenia, hyperlipidemia, GERD who was admitted to [facility] on 04[facility]16/2021 for Hyponatremia [E87.1] Bowel perforation ([place]) [K63.1] Acute renal failure, unspecified acute renal failure type ([place]) [N17.9] She was found to have bowel perforation with significant pneumoperitoneum. ExLap by Dr. Celine Ahr on 7/3 .    1.  Acute kidney injury with baseline normal creatinine 2.  Metabolic acidosis. 3.  Acute respiratory failure. 4.  Hyponatremia. 5.  Bowel perforation status post exploratory laparotomy. 6.  Septic shock. 7. Hypotension  Plan:  Continue CRRT.  Continue vasopressor: norepinephrine.    LOS: 6 Lindsay [name] 7/9/20229:43 AM

## 2021-04-13 NOTE — Progress Notes (Signed)
Patient BP running in the 500-370W systolic, sustained. Denies pain at this time. Britton-Lee, NP made aware. PRN Labetalol ordered and given. Will continue to monitor.

## 2021-04-14 ENCOUNTER — Inpatient Hospital Stay: Payer: Medicare Other

## 2021-04-14 LAB — GLUCOSE, CAPILLARY
Glucose-Capillary: 159 mg/dL — ABNORMAL HIGH (ref 70–99)
Glucose-Capillary: 163 mg/dL — ABNORMAL HIGH (ref 70–99)
Glucose-Capillary: 165 mg/dL — ABNORMAL HIGH (ref 70–99)
Glucose-Capillary: 170 mg/dL — ABNORMAL HIGH (ref 70–99)
Glucose-Capillary: 176 mg/dL — ABNORMAL HIGH (ref 70–99)
Glucose-Capillary: 178 mg/dL — ABNORMAL HIGH (ref 70–99)
Glucose-Capillary: 181 mg/dL — ABNORMAL HIGH (ref 70–99)
Glucose-Capillary: 194 mg/dL — ABNORMAL HIGH (ref 70–99)

## 2021-04-14 LAB — BODY FLUID CULTURE W GRAM STAIN: Special Requests: NORMAL

## 2021-04-14 LAB — RENAL FUNCTION PANEL
Albumin: 2.1 g/dL — ABNORMAL LOW (ref 3.5–5.0)
Albumin: 2.5 g/dL — ABNORMAL LOW (ref 3.5–5.0)
Anion gap: 3 — ABNORMAL LOW (ref 5–15)
Anion gap: 6 (ref 5–15)
BUN: 27 mg/dL — ABNORMAL HIGH (ref 8–23)
BUN: 28 mg/dL — ABNORMAL HIGH (ref 8–23)
CO2: 26 mmol/L (ref 22–32)
CO2: 25 mmol/L (ref 22–32)
Calcium: 7.8 mg/dL — ABNORMAL LOW (ref 8.9–10.3)
Calcium: 8.1 mg/dL — ABNORMAL LOW (ref 8.9–10.3)
Chloride: 104 mmol/L (ref 98–111)
Chloride: 103 mmol/L (ref 98–111)
Creatinine, Ser: 0.59 mg/dL (ref 0.44–1.00)
Creatinine, Ser: 0.54 mg/dL (ref 0.44–1.00)
GFR, Estimated: >60 mL/min (ref >60)
GFR, Estimated: >60 mL/min
Glucose, Bld: 199 mg/dL — ABNORMAL HIGH (ref 70–99)
Glucose, Bld: 194 mg/dL — ABNORMAL HIGH (ref 70–99)
Phosphorus: 2.3 mg/dL — ABNORMAL LOW (ref 2.5–4.6)
Phosphorus: 2.4 mg/dL — ABNORMAL LOW (ref 2.5–4.6)
Potassium: 4.2 mmol/L (ref 3.5–5.1)
Potassium: 4 mmol/L (ref 3.5–5.1)
Sodium: 133 mmol/L — ABNORMAL LOW (ref 135–145)
Sodium: 134 mmol/L — ABNORMAL LOW (ref 135–145)

## 2021-04-14 LAB — CBC
HCT: 20.3 % — ABNORMAL LOW (ref 36.0–46.0)
Hemoglobin: 6.8 g/dL — ABNORMAL LOW (ref 12.0–15.0)
MCH: 28.2 pg (ref 26.0–34.0)
MCHC: 33.5 g/dL (ref 30.0–36.0)
MCV: 84.2 fL (ref 80.0–100.0)
Platelets: 44 K/uL — ABNORMAL LOW (ref 150–400)
RBC: 2.41 MIL/uL — ABNORMAL LOW (ref 3.87–5.11)
RDW: 20.7 % — ABNORMAL HIGH (ref 11.5–15.5)
WBC: 23.4 K/uL — ABNORMAL HIGH (ref 4.0–10.5)
nRBC: 0.1 % (ref 0.0–0.2)

## 2021-04-14 LAB — HEMOGLOBIN AND HEMATOCRIT, BLOOD
HCT: 23.4 % — ABNORMAL LOW (ref 36.0–46.0)
Hemoglobin: 7.8 g/dL — ABNORMAL LOW (ref 12.0–15.0)

## 2021-04-14 LAB — PROTIME-INR
INR: 1.4 — ABNORMAL HIGH (ref 0.8–1.2)
INR: 1.3 — ABNORMAL HIGH (ref 0.8–1.2)
Prothrombin Time: 16.8 s — ABNORMAL HIGH (ref 11.4–15.2)
Prothrombin Time: 16.3 seconds — ABNORMAL HIGH (ref 11.4–15.2)

## 2021-04-14 LAB — MAGNESIUM: Magnesium: 2.4 mg/dL (ref 1.7–2.4)

## 2021-04-14 LAB — PHOSPHORUS: Phosphorus: 2.2 mg/dL — ABNORMAL LOW (ref 2.5–4.6)

## 2021-04-14 LAB — APTT: aPTT: 70 seconds — ABNORMAL HIGH (ref 24–36)

## 2021-04-14 LAB — FIBRINOGEN: Fibrinogen: 443 mg/dL (ref 210–475)

## 2021-04-14 LAB — PREPARE RBC (CROSSMATCH)

## 2021-04-14 MED ORDER — PIVOT 1.5 CAL PO LIQD
1000.0000 mL | ORAL | Status: DC
  Administered 2021-04-14: 1000 mL
  Filled 2021-04-14: qty 1000

## 2021-04-14 MED ORDER — LABETALOL HCL 5 MG/ML IV SOLN
5.0000 mg | INTRAVENOUS | Status: DC | PRN
  Administered 2021-04-14 (×2): 5 mg via INTRAVENOUS
  Filled 2021-04-14 (×2): qty 4

## 2021-04-14 MED ORDER — HYDRALAZINE HCL 20 MG/ML IJ SOLN: 10.0000 mg | Freq: Once | INTRAMUSCULAR | Status: AC

## 2021-04-14 MED ORDER — TRAVASOL 10 % IV SOLN
INTRAVENOUS | Status: AC
  Filled 2021-04-14: qty 1176

## 2021-04-14 MED ORDER — HYDRALAZINE HCL 20 MG/ML IJ SOLN
INTRAMUSCULAR | Status: AC
  Administered 2021-04-14: 10 mg via INTRAVENOUS
  Filled 2021-04-14: qty 1

## 2021-04-14 MED ORDER — INSULIN ASPART 100 UNIT/ML IJ SOLN
4.0000 [IU] | INTRAMUSCULAR | Status: DC
  Administered 2021-04-14 – 2021-04-16 (×10): 4 [IU] via SUBCUTANEOUS
  Filled 2021-04-14 (×10): qty 1

## 2021-04-14 MED ORDER — POTASSIUM & SODIUM PHOSPHATES 280-160-250 MG PO PACK
2.0000 | PACK | ORAL | Status: AC
  Administered 2021-04-14 (×3): 2
  Filled 2021-04-14 (×3): qty 2

## 2021-04-14 MED ORDER — IOHEXOL 300 MG/ML  SOLN
75.0000 mL | Freq: Once | INTRAMUSCULAR | Status: AC | PRN
  Administered 2021-04-14: 75 mL via INTRAVENOUS

## 2021-04-14 MED ORDER — LABETALOL HCL 5 MG/ML IV SOLN
5.0000 mg | Freq: Once | INTRAVENOUS | Status: AC
  Administered 2021-04-14: 5 mg via INTRAVENOUS

## 2021-04-14 MED ORDER — SODIUM CHLORIDE 0.9% IV SOLUTION
Freq: Once | INTRAVENOUS | Status: AC
Start: 2021-04-14 — End: 2021-04-14

## 2021-04-14 MED ORDER — ARGATROBAN 50 MG/50ML IV SOLN
0.4500 ug/kg/min | INTRAVENOUS | Status: DC
  Administered 2021-04-14: 0.5 ug/kg/min via INTRAVENOUS
  Filled 2021-04-14 (×3): qty 50

## 2021-04-14 MED ORDER — LABETALOL HCL 5 MG/ML IV SOLN: 20.0000 mg | Freq: Once | INTRAVENOUS | Status: DC

## 2021-04-14 NOTE — Plan of Care (Signed)
Pt turned Q2H this shift, TF increased to 30 cc/hr, pt transported to CT and back by this RN and RT, and transporter, small amts thick stool in colostomy bag, no blood observed, 1 unit PRBCs, transfused.  Anuria continues, very minimal drainage from JP drains, no NPWT output Pt continues with random episodes similar to those described by night shift RN, in which she suddenly seems panicked with eyes open very wide, appears fearful, SBP accelerates rapidly into 200s (as high as 260), HR increases from 70s to low 100s.  2 mgs Versed is efficacious for these episodes.  AFib/Vpacing continues. CRRT recirculated while pt was in CT, restarted w/o any issues

## 2021-04-14 NOTE — Progress Notes (Signed)
Pharmacy Heparin Induced Thrombocytopenia (HIT) Note:  Lindsay [name] is an 75 y.o. female being evaluated for HIT. Patient has not received heparin other than 2 doses intracatheter for CRRT on 7/6 and 7/8. Platelets were 461 early 7/3 morning when patient presented and drifted down to 266 later that same day. Continued to decline since admission. Patient has been on CRRT since 7/4.  HIT labs were ordered on 7/10 when platelets dropped to 44.   CALCULATE SCORE:  4Ts (see the HIT Algorithm) Score  Thrombocytopenia 1  Timing 1  Thrombosis 1  Other causes of thrombocytopenia 0  Total 3 - low probability of HIT    Per surgeon's note, concern for DIC or HIT. With limited exposure to heparin during CRRT, question whether thrombocytopenia likely multifactorial in setting of continued CRRT and possible DIC.   Plan (Discussed with provider) Labs ordered:  HIT antibody, SRA pending Heparin allergy:  Heparin allergy documented or updated. Anticoagulation plans:  Begin alternative anticoagulation with argatroban per consult from provider  Goal aPTT: 50-90 s  Start argatroban 0.5 mcg/kg/min Check aPTT in 4 hours Daily CBC while on argatroban Continue to follow HIT antibody, SRA   Tawnya Crook, PharmD 04/14/2021, 2:44 PM

## 2021-04-14 NOTE — Progress Notes (Signed)
NAME:  Lindsay [name], MRN:  [id number], DOB:  Dec 20, 1945, LOS: 7 ADMISSION DATE:  04/18/2021    Chief complaint:  Septic Shock   Abbreviated history:   75 yo F admitted with abdominal pain and sepsis with perforated viscus.  She was taken to OR s/o GI surgery with resection and ostomy removal of 1.5L of pus.  Drainage grew resistant Klebsiella.   Patient admitted to [facility] for septic shock due to intraabdominal infection.      Events:  04/11/2021 admitted post emergent ex lap for perforated viscus, peritonitis, severe sepsis 05/01/2021 piperacillin tazobactam and Eraxis initiated 7/4 remains critically ill, severe multiorgan failure 7/5 remains critically ill, severe shock, on CRRT 7/6 remains on vent, pressors and CRRT 7/7 remains on CRRT, on pressors, on vent 04/12/21- patient is critically ill on levophed, TPN, mechanical ventilation, dialysis, abdominal surgical wound with 2 JP drains actively draining, midline surgical wound ,  ostomy with active stooling, acrocyanosis, comatose in [place].  Plan to start PO feeds.  04/13/21- patient improved slighlty and weaned off all vasopressor.  Met with husband and reviwed care plan.   04/14/21 - patient has mild neurologic recovery with improved GCS score.  Plan to transfuse blood today and repeat abdominal imaging.   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 Fib Diverticulitis     Interim History / Subjective:  Severe shock On CRRT On pressors +multiorgan failure   Antimicrobials:   Antibiotics Given (last 72 hours)     Date/Time Action Medication Dose Rate   04/11/21 1411 New Bag/Given   piperacillin-tazobactam (ZOSYN) IVPB 3.375 g 3.375 g 100 mL/hr   04/11/21 1922 New Bag/Given   piperacillin-tazobactam (ZOSYN) IVPB 3.375 g 3.375 g 100 mL/hr   04/12/21 0104 New Bag/Given   piperacillin-tazobactam (ZOSYN) IVPB 3.375 g 3.375 g 100 mL/hr   04/12/21 0805 New Bag/Given   piperacillin-tazobactam (ZOSYN) IVPB 3.375 g  3.375 g 100 mL/hr   04/12/21 1404 New Bag/Given   piperacillin-tazobactam (ZOSYN) IVPB 3.375 g 3.375 g 100 mL/hr   04/12/21 1808 New Bag/Given   meropenem (MERREM) 1 g in sodium chloride 0.9 % 100 mL IVPB 1 g 200 mL/hr   04/12/21 2209 New Bag/Given   meropenem (MERREM) 1 g in sodium chloride 0.9 % 100 mL IVPB 1 g 200 mL/hr   04/13/21 0539 New Bag/Given   meropenem (MERREM) 1 g in sodium chloride 0.9 % 100 mL IVPB 1 g 200 mL/hr   04/13/21 1308 New Bag/Given   meropenem (MERREM) 1 g in sodium chloride 0.9 % 100 mL IVPB 1 g 200 mL/hr   04/13/21 2125 New Bag/Given   meropenem (MERREM) 1 g in sodium chloride 0.9 % 100 mL IVPB 1 g 200 mL/hr   04/14/21 0529 New Bag/Given   meropenem (MERREM) 1 g in sodium chloride 0.9 % 100 mL IVPB 1 g 200 mL/hr        Objective   Blood pressure 114/62, pulse 68, temperature (!) 93.02 F (33.9 C), temperature source Rectal, resp. rate 20, height '5\' 5"'  (1.651 m), weight 81.7 kg, SpO2 96 %. CVP:  [10 mmHg-16 mmHg] 16 mmHg  Vent Mode: PRVC FiO2 (%):  [24 %] 24 % Set Rate:  [20 bmp] 20 bmp Vt Set:  [450 mL] 450 mL PEEP:  [5 cmH20] 5 cmH20 Pressure Support:  [5 cmH20] 5 cmH20 Plateau Pressure:  [20 cmH20-22 cmH20] 20 cmH20   Intake/Output Summary (Last 24 hours) at 04/14/2021 1007 Last data filed  at 04/14/2021 0900 Gross per 24 hour  Intake 4099.64 ml  Output 4184.1 ml  Net -84.46 ml    Filed Weights   04/12/21 0318 04/13/21 0500 04/14/21 0417  Weight: 79.1 kg 81.7 kg 81.7 kg      REVIEW OF SYSTEMS  PATIENT IS UNABLE TO PROVIDE COMPLETE REVIEW OF SYSTEMS DUE TO SEVERE CRITICAL ILLNESS AND TOXIC METABOLIC ENCEPHALOPATHY on mechanical ventilation with sedation    PHYSICAL EXAMINATION:  GENERAL:critically ill appearing, +resp distress HEAD: Normocephalic, atraumatic.  EYES: Pupils equal, round, reactive to light.  No scleral icterus.  MOUTH: Moist mucosal membrane. NECK: Supple. +ETT PULMONARY: +rhonchi, CARDIOVASCULAR: S1 and S2. Regular  rate and rhythm. No murmurs, rubs, or gallops.  GASTROINTESTINAL:distended +drains MUSCULOSKELETAL: No swelling, clubbing, or edema.  NEUROLOGIC: GCS4T SKIN:intact,warm,dry   Labs/imaging that I havepersonally reviewed  (right click and "Reselect all SmartList Selections" daily)       ASSESSMENT AND PLAN SYNOPSIS   75 yo female with severe bowel perforation SEPSIS PRESENT ON ADMISSION and perforated ABD with ABD abscess with severe septic shock s/p ex lap with progressive multiorgan failure    SEPTIC SHOCK           Present on admission - due to intraabdominal infection with resistant Klebsiella and large area of infection with perforated viscus s/p surgery with removal of large amount of pus SOURCE-ABD bowel perf -off vasopressors now -plan to repeat CT imaging of abdomen/pelvis today  -ID on case - patient on Merem and diflucan - appreciate input    Severe ACUTE Hypoxic and Hypercapnic Respiratory Failure -continue Mechanical Ventilator support -continue Bronchodilator Therapy -Wean Fio2 and PEEP as tolerated -VAP/VENT bundle implementation  Vent Mode: PRVC FiO2 (%):  [24 %] 24 % Set Rate:  [20 bmp] 20 bmp Vt Set:  [450 mL] 450 mL PEEP:  [5 cmH20] 5 cmH20 Pressure Support:  [5 cmH20] 5 cmH20 Plateau Pressure:  [20 cmH20-22 cmH20] 20 cmH20 -perform SBT if patient is appropriate    CARDIAC FAILURE-acute MAT, Afib -oxygen as needed -Lasix as tolerated follow up cardiac enzymes as indicated -stopped AMIOdarone today - patient is sinus rhythm with occasional PVCs and PACs - will monitor electorlytes and replete as needed     CARDIAC ICU monitoring   ACUTE KIDNEY INJURY/Renal Failure -continue Foley Catheter-assess need -Avoid nephrotoxic agents -Follow urine output, BMP -Ensure adequate renal perfusion, optimize oxygenation -Renal dose medications On CRRT   Intake/Output Summary (Last 24 hours) at 04/14/2021 1007 Last data filed at 04/14/2021  0900 Gross per 24 hour  Intake 4099.64 ml  Output 4184.1 ml  Net -84.46 ml      NEUROLOGY Acute toxic metabolic encephalopathy, need for sedation Goal RASS -2 to -3   ENDO - ICU hypoglycemic\Hyperglycemia protocol -check FSBS per protocol   GI GI PROPHYLAXIS as indicated  NUTRITIONAL STATUS DIET-->TPN Constipation protocol as indicated   ELECTROLYTES -follow labs as needed -replace as needed -pharmacy consultation and following   Best practice (right click and "Reselect all SmartList Selections" daily)  Diet:  NPO Pain/Anxiety/Delirium protocol (if indicated): Yes (RASS goal -2) VAP protocol (if indicated): Yes DVT prophylaxis: Subcutaneous Heparin GI prophylaxis: PPI Glucose control:  SSI Yes Central venous access:  Yes, and it is still needed Arterial line:  Yes, and it is still needed Foley:  Yes, and it is still needed Mobility:  bed rest  PT consulted: N/A Code Status:  DNR Disposition: ICU   Labs   CBC: Recent Labs  Lab 05/02/2021 1256 04/25/2021  1651 04/09/21 0521 04/10/21 3790 04/11/21 0404 04/12/21 0403 04/13/21 0432 04/14/21 0450  WBC 14.9*   < > 19.3* 21.8* 38.9* 46.8* 33.4* 23.4*  NEUTROABS 14.5*  --  18.5* 20.4*  --  42.6*  --   --   HGB 6.7*   < > 8.3* 8.0* 10.1* 9.2* 7.6* 6.8*  HCT 21.3*   < > 24.2* 23.8* 31.2* 28.5* 23.4* 20.3*  MCV 79.8*   < > 82.9 83.2 86.9 86.4 86.7 84.2  PLT 266   < > 131* 120* 97* 72* 59* 44*   < > = values in this interval not displayed.     Basic Metabolic Panel: Recent Labs  Lab 04/10/21 0312 04/10/21 1500 04/11/21 0404 04/11/21 1616 04/12/21 0403 04/12/21 1704 04/13/21 0432 04/13/21 1640 04/14/21 0450  NA 134*   < > 134*  135   < > 133* 130* 130* 135 133*  K 3.6   < > 3.9  3.9   < > 4.1 4.2 4.6 4.0 4.2  CL 104   < > 101  101   < > 102 100 104 104 104  CO2 27   < > 29  28   < > '26 26 24 26 26  ' GLUCOSE 237*   < > 211*  210*   < > 276* 266* 204* 209* 199*  BUN 20   < > 17  17   < > '15 15  21 22 ' 27*  CREATININE 1.09*   < > 0.78  0.78   < > 0.68 0.75 0.83 0.63 0.59  CALCIUM 7.3*   < > 7.4*  7.3*   < > 7.2* 7.4* 7.5* 7.5* 7.8*  MG 2.3  --  2.3  --  2.3  --  2.3  --  2.4  PHOS 1.6*   < > 2.2*  2.2*   < > 2.9 2.7 2.9  2.9 1.8* 2.2*  2.3*   < > = values in this interval not displayed.    GFR: Estimated Creatinine Clearance: 64.2 mL/min (by C-G formula based on SCr of 0.59 mg/dL). Recent Labs  Lab 04/12/2021 1256 04/08/21 0307 04/09/21 0521 04/09/21 1124 04/10/21 0312 04/11/21 0404 04/12/21 0403 04/13/21 0432 04/14/21 0450  PROCALCITON 15.49 22.12 11.60  --   --   --   --   --   --   WBC 14.9* 21.3* 19.3*  --    < > 38.9* 46.8* 33.4* 23.4*  LATICACIDVEN 2.9* 1.4  --  1.3  --   --   --   --   --    < > = values in this interval not displayed.     Liver Function Tests: Recent Labs  Lab 04/08/2021 1256 04/08/21 0307 04/09/21 0521 04/09/21 1601 04/11/21 0404 04/11/21 1616 04/12/21 0536 04/12/21 1704 04/13/21 0432 04/13/21 1640 04/14/21 0450  AST 42*  --  38  --  16  --  14*  --   --   --   --   ALT 21  --  31  --  19  --  14  --   --   --   --   ALKPHOS 28*  --  44  --  63  --  69  --   --   --   --   BILITOT 1.4*  --  1.3*  --  0.7  --  0.7  --   --   --   --   PROT 3.8*  --  4.3*  --  4.5*  --  4.2*  --   --   --   --   ALBUMIN 2.3*   < > 2.0*  2.0*   < > 1.7*  1.8*   < > 1.7* 1.8* 1.7* 2.1* 2.1*   < > = values in this interval not displayed.    No results for input(s): LIPASE, AMYLASE in the last 168 hours.  No results for input(s): AMMONIA in the last 168 hours.  ABG    Component Value Date/Time   PHART 7.38 04/09/2021 0341   PCO2ART 40 04/09/2021 0341   PO2ART 115 (H) 04/09/2021 0341   HCO3 23.7 04/09/2021 0341   ACIDBASEDEF 1.3 04/09/2021 0341   O2SAT 98.4 04/09/2021 0341      Coagulation Profile: Recent Labs  Lab 04/23/2021 1256 04/12/21 0536 04/14/21 0450  INR 3.3* 1.4* 1.4*     Cardiac Enzymes: No results for input(s):  CKTOTAL, CKMB, CKMBINDEX, TROPONINI in the last 168 hours.  HbA1C: Hgb A1c MFr Bld  Date/Time Value Ref Range Status  04/06/2021 04:51 PM 5.7 (H) 4.8 - 5.6 % Final    Comment:    (NOTE)         Prediabetes: 5.7 - 6.4         Diabetes: >6.4         Glycemic control for adults with diabetes: <7.0     CBG: Recent Labs  Lab 04/13/21 1930 04/13/21 1937 04/14/21 0001 04/14/21 0352 04/14/21 0801  GLUCAP 161* 176* 159* 165* 163*     Allergies Allergies  Allergen Reactions   Amiodarone     Other reaction(s): Other (See Comments) Nausea, diarrhea, weight loss, anxiety, jitteriness   Levofloxacin Rash    Red palms, lips, forearms    Meloxicam Shortness Of Breath    Other reaction(s): Respiratory Distress   Amlodipine     Other reaction(s): Other (See Comments) Caused severe gingival hyperplasia   Amlodipine Besy-Benazepril Hcl     Severe gingival hyperplasia  Other reaction(s): Other (See Comments) Other reaction(s): Other   Beta Adrenergic Blockers     States severe fatigue  Other reaction(s): Other (See Comments) fatigue        DVT/GI PRX  assessed I Assessed the need for Labs I Assessed the need for Foley I Assessed the need for Central Venous Line Family Discussion when available I Assessed the need for Mobilization I made an Assessment of medications to be adjusted accordingly Safety Risk assessment completed  CASE DISCUSSED IN MULTIDISCIPLINARY ROUNDS WITH ICU TEAM     Critical Care Time devoted to patient care services described in this note is 33 minutes.  Critical care was necessary to treat or prevent imminent or life-threatening deterioration.   PATIENT WITH VERY POOR PROGNOSIS I ANTICIPATE PROLONGED ICU LOS  Patient with Multiorgan failure and at high risk for cardiac arrest and death.     Ottie Glazier, M.D.  [facility] [place]

## 2021-04-14 NOTE — Progress Notes (Signed)
[facility]  ROUNDING NOTE   Subjective:   On CRRT. Started ultrafiltration this morning. Off vasopressors.   Tmin 92.8    Wbc 23.4 (33.4) (46.8) Hgb 6.8 - PRBC transfusion scheduled.   Amiodarone gtt TPN  Objective:  Vital signs in last 24 hours:  Temp:  [92.84 F (33.8 C)-93.92 F (34.4 C)] 92.84 F (33.8 C) (07/10 1000) Pulse Rate:  [40-119] 43 (07/10 1000) Resp:  [16-25] 21 (07/10 1000) BP: (114-185)/(51-93) 131/59 (07/10 1000) SpO2:  [93 %-99 %] 98 % (07/10 1000) Arterial Line BP: (120-210)/(42-87) 168/52 (07/10 1000) FiO2 (%):  [24 %] 24 % (07/10 0900) Weight:  [81.7 kg] 81.7 kg (07/10 0417)  Weight change: 0 kg Filed Weights   04/12/21 0318 04/13/21 0500 04/14/21 0417  Weight: 79.1 kg 81.7 kg 81.7 kg    Intake/Output: I/O last 3 completed shifts: In: 6819.8 [I.V.:4023.7; Other:510; NG/GT:1075; IV Piggyback:1211.1] Out: 6018.8 [Urine:5; Emesis/NG output:50; Drains:50; Other:5913.8]   Intake/Output this shift:  Total I/O In: 479.5 [I.V.:319.5; Other:10; NG/GT:120; IV Piggyback:30] Out: 459.1 [Urine:1; Drains:4; Other:404.1; Stool:50]  Physical Exam: General: Critically ill  Head: ETT  Eyes: Anicteric  Neck: Supple  Lungs:  PRVC fiO2 24%  Heart: irregular  Abdomen:  +midline incision  Extremities: + upper extremity edema  Neurologic: Intubated, sedated  Skin: No acute rash  Access: Left IJ temporary dialysis catheter    Basic Metabolic Panel: Recent Labs  Lab 04/10/21 0312 04/10/21 1500 04/11/21 0404 04/11/21 1616 04/12/21 0403 04/12/21 1704 04/13/21 0432 04/13/21 1640 04/14/21 0450  NA 134*   < > 134*  135   < > 133* 130* 130* 135 133*  K 3.6   < > 3.9  3.9   < > 4.1 4.2 4.6 4.0 4.2  CL 104   < > 101  101   < > 102 100 104 104 104  CO2 27   < > 29  28   < > _0 GLUCOSE 237*   < > 211*  210*   < > 276* 266* 204* 209* 199*  BUN 20   < > 17  17   < > _1 27*  CREATININE 1.09*   < > 0.78  0.78   <  > 0.68 0.75 0.83 0.63 0.59  CALCIUM 7.3*   < > 7.4*  7.3*   < > 7.2* 7.4* 7.5* 7.5* 7.8*  MG 2.3  --  2.3  --  2.3  --  2.3  --  2.4  PHOS 1.6*   < > 2.2*  2.2*   < > 2.9 2.7 2.9  2.9 1.8* 2.2*  2.3*   < > = values in this interval not displayed.     Liver Function Tests: Recent Labs  Lab 05/04/2021 1256 04/08/21 0307 04/09/21 0521 04/09/21 1601 04/11/21 0404 04/11/21 1616 04/12/21 0536 04/12/21 1704 04/13/21 0432 04/13/21 1640 04/14/21 0450  AST 42*  --  38  --  16  --  14*  --   --   --   --   ALT 21  --  31  --  19  --  14  --   --   --   --   ALKPHOS 28*  --  44  --  63  --  69  --   --   --   --   BILITOT 1.4*  --  1.3*  --  0.7  --  0.7  --   --   --   --  PROT 3.8*  --  4.3*  --  4.5*  --  4.2*  --   --   --   --   ALBUMIN 2.3*   < > 2.0*  2.0*   < > 1.7*  1.8*   < > 1.7* 1.8* 1.7* 2.1* 2.1*   < > = values in this interval not displayed.    No results for input(s): LIPASE, AMYLASE in the last 168 hours.  No results for input(s): AMMONIA in the last 168 hours.  CBC: Recent Labs  Lab 04/06/2021 1256 04/10/2021 1651 04/09/21 0521 04/10/21 0312 04/11/21 0404 04/12/21 0403 04/13/21 0432 04/14/21 0450  WBC 14.9*   < > 19.3* 21.8* 38.9* 46.8* 33.4* 23.4*  NEUTROABS 14.5*  --  18.5* 20.4*  --  42.6*  --   --   HGB 6.7*   < > 8.3* 8.0* 10.1* 9.2* 7.6* 6.8*  HCT 21.3*   < > 24.2* 23.8* 31.2* 28.5* 23.4* 20.3*  MCV 79.8*   < > 82.9 83.2 86.9 86.4 86.7 84.2  PLT 266   < > 131* 120* 97* 72* 59* 44*   < > = values in this interval not displayed.     Cardiac Enzymes: No results for input(s): CKTOTAL, CKMB, CKMBINDEX, TROPONINI in the last 168 hours.  BNP: Invalid input(s): POCBNP  CBG: Recent Labs  Lab 04/13/21 1930 04/13/21 1937 04/14/21 0001 04/14/21 0352 04/14/21 0801  GLUCAP 161* 176* 159* 165* 163*     Microbiology: Results for orders placed or performed during the hospital encounter of 04/15/2021  Resp Panel by RT-PCR (Flu A&B, Covid)  Nasopharyngeal Swab     Status: None   Collection Time: 04/17/2021  4:40 AM   Specimen: Nasopharyngeal Swab; Nasopharyngeal(NP) swabs in vial transport medium  Result Value Ref Range Status   SARS Coronavirus 2 by RT PCR NEGATIVE NEGATIVE Final    Comment: (NOTE) SARS-CoV-2 target nucleic acids are NOT DETECTED.  The SARS-CoV-2 RNA is generally detectable in upper respiratory specimens during the acute phase of infection. The lowest concentration of SARS-CoV-2 viral copies this assay can detect is 138 copies/mL. A negative result does not preclude SARS-Cov-2 infection and should not be used as the sole basis for treatment or other patient management decisions. A negative result may occur with  improper specimen collection/handling, submission of specimen other than nasopharyngeal swab, presence of viral mutation(s) within the areas targeted by this assay, and inadequate number of viral copies(<138 copies/mL). A negative result must be combined with clinical observations, patient history, and epidemiological information. The expected result is Negative.  Fact Sheet for Patients:  EntrepreneurPulse.com.au  Fact Sheet for Healthcare Providers:  IncredibleEmployment.be  This test is no t yet approved or cleared by the [place] FDA and  has been authorized for detection and/or diagnosis of SARS-CoV-2 by FDA under an Emergency Use Authorization (EUA). This EUA will remain  in effect (meaning this test can be used) for the duration of the COVID-19 declaration under Section 564(b)(1) of the Act, 21 U.S.C.section 360bbb-3(b)(1), unless the authorization is terminated  or revoked sooner.       Influenza A by PCR NEGATIVE NEGATIVE Final   Influenza B by PCR NEGATIVE NEGATIVE Final    Comment: (NOTE) The Xpert Xpress SARS-CoV-2/FLU/RSV plus assay is intended as an aid in the diagnosis of influenza from Nasopharyngeal swab specimens and should not be  used as a sole basis for treatment. Nasal washings and aspirates are unacceptable for Xpert Xpress SARS-CoV-2/FLU/RSV testing.  Fact Sheet for Patients: EntrepreneurPulse.com.au  Fact Sheet for Healthcare Providers: IncredibleEmployment.be  This test is not yet approved or cleared by the [place] FDA and has been authorized for detection and/or diagnosis of SARS-CoV-2 by FDA under an Emergency Use Authorization (EUA). This EUA will remain in effect (meaning this test can be used) for the duration of the COVID-19 declaration under Section 564(b)(1) of the Act, 21 U.S.C. section 360bbb-3(b)(1), unless the authorization is terminated or revoked.  Performed at [facility], [address] [place], [place] [address]   Blood culture (routine x 2)     Status: None   Collection Time: 04/16/2021  5:33 AM   Specimen: BLOOD  Result Value Ref Range Status   Specimen Description BLOOD LEFT ANTECUBITAL  Final   Special Requests   Final    BOTTLES DRAWN AEROBIC AND ANAEROBIC Blood Culture adequate volume   Culture   Final    NO GROWTH 5 DAYS Performed at [facility], [address], [place], [place] [address]    Report Status 04/12/2021 FINAL  Final  Blood culture (routine x 2)     Status: None   Collection Time: 04/06/2021  7:40 AM   Specimen: BLOOD  Result Value Ref Range Status   Specimen Description BLOOD BLOOD RIGHT WRIST  Final   Special Requests   Final    BOTTLES DRAWN AEROBIC AND ANAEROBIC Blood Culture adequate volume   Culture   Final    NO GROWTH 5 DAYS Performed at [facility], [address], [place], [place] [address]    Report Status 04/12/2021 FINAL  Final  MRSA Next Gen by PCR, Nasal     Status: None   Collection Time: 05/04/2021 12:50 PM   Specimen: Nasal Mucosa; Nasal Swab  Result Value Ref Range Status   MRSA by PCR Next Gen NOT DETECTED NOT DETECTED Final    Comment: (NOTE) The GeneXpert MRSA  Assay (FDA approved for NASAL specimens only), is one component of a comprehensive MRSA colonization surveillance program. It is not intended to diagnose MRSA infection nor to guide or monitor treatment for MRSA infections. Test performance is not FDA approved in patients less than 36 years old. Performed at [facility], [address] [place], [place] [address]   Body fluid culture w Gram Stain     Status: None (Preliminary result)   Collection Time: 04/10/21  4:30 PM   Specimen: JP Drain; Body Fluid  Result Value Ref Range Status   Specimen Description   Final    JP DRAINAGE Performed at [facility], [address] [place], [place] [address]    Special Requests   Final    Normal Performed at [facility], [address] [place], [place] [address]    Gram Stain   Final    ABUNDANT WBC PRESENT, PREDOMINANTLY PMN NO ORGANISMS SEEN    Culture   Final    RARE KLEBSIELLA PNEUMONIAE CRITICAL RESULT CALLED TO, READ BACK BY AND VERIFIED WITH: RN E.JENON AT 0945 ON 04/11/2021 BY T.SAAD. Performed at [facility], [place] [address], [place], [place] [address]    Report Status PENDING  Incomplete   Organism ID, Bacteria KLEBSIELLA PNEUMONIAE  Final      Susceptibility   Klebsiella pneumoniae - MIC*    AMPICILLIN RESISTANT Resistant     CEFAZOLIN <=4 SENSITIVE Sensitive     CEFEPIME <=0.12 SENSITIVE Sensitive     CEFTAZIDIME <=1 SENSITIVE Sensitive  CEFTRIAXONE <=0.25 SENSITIVE Sensitive     CIPROFLOXACIN <=0.25 SENSITIVE Sensitive     GENTAMICIN <=1 SENSITIVE Sensitive     IMIPENEM <=0.25 SENSITIVE Sensitive     TRIMETH/SULFA <=20 SENSITIVE Sensitive     AMPICILLIN/SULBACTAM <=2 SENSITIVE Sensitive     PIP/TAZO <=4 SENSITIVE Sensitive     * RARE KLEBSIELLA PNEUMONIAE    Coagulation Studies: Recent Labs    04/12/21 0536 04/14/21 0450  LABPROT 17.4* 16.8*  INR 1.4* 1.4*     Urinalysis: No results for input(s): COLORURINE, LABSPEC,  PHURINE, GLUCOSEU, HGBUR, BILIRUBINUR, KETONESUR, PROTEINUR, UROBILINOGEN, NITRITE, LEUKOCYTESUR in the last 72 hours.  Invalid input(s): APPERANCEUR     Imaging: No results found.   Medications:     prismasol BGK 4/2.5 500 mL/hr at 04/14/21 0259    prismasol BGK 4/2.5 500 mL/hr at 04/14/21 0442   albumin human 25 g (04/14/21 0831)   amiodarone 30.06 mg/hr (04/12/21 2100)   dexmedetomidine (PRECEDEX) IV infusion 0.8 mcg/kg/hr (04/14/21 1000)   fentaNYL infusion INTRAVENOUS 175 mcg/hr (04/14/21 1000)   fluconazole (DIFLUCAN) IV Stopped (04/13/21 1809)   meropenem (MERREM) IV Stopped (04/14/21 0559)   norepinephrine (LEVOPHED) Adult infusion Stopped (04/13/21 1040)   prismasol BGK 4/2.5 2,000 mL/hr at 04/14/21 0108   TPN ADULT (ION) 70 mL/hr at 04/14/21 1000   TPN ADULT (ION)     vasopressin Stopped (04/13/21 1528)    amiodarone  400 mg Per NG tube BID   chlorhexidine gluconate (MEDLINE KIT)  15 mL Mouth Rinse BID   Chlorhexidine Gluconate Cloth  6 each Topical Q0600   feeding supplement (PIVOT 1.5 CAL)  1,000 mL Per Tube Q24H   free water  30 mL Per Tube Q4H   hydrocortisone sod succinate (SOLU-CORTEF) inj  100 mg Intravenous Q12H   insulin aspart  0-20 Units Subcutaneous Q4H   insulin aspart  4 Units Subcutaneous Q4H   mouth rinse  15 mL Mouth Rinse 10 times per day   pantoprazole (PROTONIX) IV  40 mg Intravenous Daily   potassium & sodium phosphates  2 packet Per Tube Q4H   albuterol, fentaNYL, heparin sodium (porcine), labetalol, midazolam, polyvinyl alcohol  Assessment/ Plan:   Ms. Lindsay [name] is a 75 y.o. white female with hypertrophic cardiomyopathy, ventricular tachycardia status post AICD placement, atrial fibrillation on rivaroxaban, diabetes mellitus type II, hypertension, history of diverticulitis, osteopenia, hyperlipidemia, GERD who was admitted to [facility] on 04/29/2021 for Hyponatremia [E87.1] Bowel perforation ([place]) [K63.1] Acute renal failure, unspecified acute  renal failure type ([place]) [N17.9] She was found to have bowel perforation with significant pneumoperitoneum. ExLap by Dr. Celine Ahr on 7/3 .    1.  Acute kidney injury with baseline normal creatinine with Metabolic acidosis and hyponatremia.-  Anuric. No signs of renal recovery.  Continue CRRT and anticipate transition to intermittent hemodialysis tomorrow.   2. Bowel perforation with purulent peritoneum. Status post ExLap. Now with wound vac. Klebsiella pneumoniae. Appreciate surgery input  3.  Acute respiratory failure: requiring intubation and mechanical ventilation.  Appreciate critical care input.   4. Septic shock with hypotension. Required vasopressors.  Stress dose steroids Empiric meropenem and fluconazole.   5. Anemia with renal failure - PRBC transfusion for today.      LOS: 7 Tanaisha Pittman 7/10/202210:09 AM

## 2021-04-14 NOTE — Progress Notes (Signed)
POD # 7 s/p [place] for perforated viscus septic shock Critically ill and septic Dynamics have improved now off vasopressors Intubated AKI that requiring CVVHD Elevated wbc, anemia and thrombocytopenia Tolerating TF trophic rate  Worsening thrombocytopenia and leukopenia   PE on the vent sedated Abd: ostomy retracted and dusky, no peritonitis, vac in place, drain serous output There Is mottling of the skin on  the left thigh   A/P  We will continue to follow I am concerned about DIC or HIT, no evidence of active bleeding at this time. CT of the abdomen pelvis scheduled for today.   May advance tube feeds slowly Continue A/Bs and  Supportive care

## 2021-04-14 NOTE — Progress Notes (Signed)
Pharmacy Heparin Induced Thrombocytopenia (HIT) Note:  Lindsay [name] is an 75 y.o. female being evaluated for HIT. Patient has not received heparin other than 2 doses intracatheter for CRRT on 7/6 and 7/8. Platelets were 461 early 7/3 morning when patient presented and drifted down to 266 later that same day. Continued to decline since admission. Patient has been on CRRT since 7/4.  HIT labs were ordered on 7/10 when platelets dropped to 44.   CALCULATE SCORE:  4Ts (see the HIT Algorithm) Score  Thrombocytopenia 1  Timing 1  Thrombosis 1  Other causes of thrombocytopenia 0  Total 3 - low probability of HIT    Per surgeon's note, concern for DIC or HIT. With limited exposure to heparin during CRRT, question whether thrombocytopenia likely multifactorial in setting of continued CRRT and possible DIC.   Plan (Discussed with provider) Labs ordered:  HIT antibody, SRA pending Heparin allergy:  Heparin allergy documented or updated. Anticoagulation plans:  Begin alternative anticoagulation with argatroban per consult from provider  Goal aPTT: 50-90 s  Date Time aPTT Rate 7/10 2100 70 0.5 mcg/kg/min  aPTT therapeutic, continue argatroban at 0.5 mcg/kg/min Check confirmatory aPTT in 4 hours Daily CBC while on argatroban Continue to follow HIT antibody, [place], PharmD 04/14/2021, 8:59 PM

## 2021-04-14 NOTE — Progress Notes (Signed)
PHARMACY - TOTAL PARENTERAL NUTRITION CONSULT NOTE   Indication:  extensive bowel surgery, poor PO intake PTA  Patient Measurements: Height: 5\' 5"  (165.1 cm) Weight: 81.7 kg (180 lb 1.9 oz) IBW/kg (Calculated) : 57 TPN AdjBW (KG): 63.5 Body mass index is 29.97 kg/m.  Assessment: 75 year old female with minimal PO intake 5 days prior to admission. Imaging showed bowel perforation; patient was taken to the OR for exploratory laparotomy with low anterior resection and colostomy placement. Patient is intubated and sedated in the ICU. Required vasopressors, which have been titrated down. Episode of atrial fibrillation with RVR 7/4, started on amiodarone infusion. Patient has h/o afib s/p ablation on Xarelto; unable to anticoagulate at this time s/t recent surgery. AKI with decreased UOP prior to hospitalization; started on CRRT 7/4.  Glucose / Insulin: on sensitive SSI 24 hr glucose range 121-194 24 hr insulin requirements 41u Electrolytes: within normal limits Renal: Started on CRRT 7/4 Intake / Output: net (+) 8 L GI Imaging: 7/3 CT abdomen/pelvis: Bowel perforation with marked inflammatory changes and pneumoperitoneum throughout the abdomen and pelvis. Bowel ischemia not excluded. GI Surgeries / Procedures:  7/3 ex lap with low anterior resection and colostomy placement  Central access: CVC right IJ TPN start date: 7/4  Nutritional Goals (per RD recommendation on 7/7): kCal: 1455 kcal/day, Protein: 110-125 g, Fluid: 1.4-1.7 L/day Goal TPN rate is 70 mL/hr (provides 117 g of protein and 1434 kcals per day)  Propofol stopped 7/8 Pivot 1.5 cal at 15 mL/hr started 7/8  Current Nutrition: NPO  Plan:  --Continue TPN at 70 mL/hr. Nutritional adjustments made per updated RD recommendations Contains 117 g protein, 184 g dextrose, 33.6 g lipids. Total Kcal 1434. Total volume 1680 mL --Electrolytes in TPN: Na 50 mEq/L, K 5 mEq/L, Ca 5 mEq/L, Mg 5 mEq/L, and Phos 15 mmol/L. Cl:Ac  1:1 Patient started on CRRT 7/4 Potassium in TPN minimal (8 mEq total) to maintain Cl:Ac ratio. Potassium will be managed via CRRT fluid Phosphorus replaced via NGT with Phos NaK 2 packets x 3 doses. Continue to monitor while on CRRT --Add standard MVI and trace elements to TPN. Remove chromium for decreased renal function. --Increase Novolog to 4 units q4h and continue resistant SSI q4h. Monitor for further adjustments. --Monitor TPN labs on Mon/Thurs at minimum. Currently ordered BID while patient is on CRRT  Tawnya Crook, PharmD 04/14/2021,9:59 AM

## 2021-04-14 NOTE — Progress Notes (Signed)
Patient continues to be hypertensive. NP aware and Hydralazine ordered and given, minimal response. Dr Juleen China notified and changed hourly net UF goal to 31ml/hr. Will continue to monitor.

## 2021-04-14 NOTE — Progress Notes (Signed)
Patient aroused in a panic. Unable to reassure. HR in the low 100s from 70s-80s and BP 003B systolic. Fentanyl bolus administered for possible pain response and PRN Versed given. Will continue to monitor.

## 2021-04-15 ENCOUNTER — Inpatient Hospital Stay: Payer: Medicare Other

## 2021-04-15 DIAGNOSIS — K651 Peritoneal abscess: Secondary | ICD-10-CM | POA: Diagnosis not present

## 2021-04-15 DIAGNOSIS — L8990 Pressure ulcer of unspecified site, unspecified stage: Secondary | ICD-10-CM | POA: Insufficient documentation

## 2021-04-15 DIAGNOSIS — N179 Acute kidney failure, unspecified: Secondary | ICD-10-CM | POA: Diagnosis not present

## 2021-04-15 DIAGNOSIS — Z7189 Other specified counseling: Secondary | ICD-10-CM

## 2021-04-15 DIAGNOSIS — J9601 Acute respiratory failure with hypoxia: Secondary | ICD-10-CM | POA: Diagnosis not present

## 2021-04-15 DIAGNOSIS — K631 Perforation of intestine (nontraumatic): Secondary | ICD-10-CM | POA: Diagnosis not present

## 2021-04-15 LAB — COMPREHENSIVE METABOLIC PANEL
ALT: 16 U/L (ref 0–44)
ALT: 17 U/L (ref 0–44)
AST: 20 U/L (ref 15–41)
AST: 22 U/L (ref 15–41)
Albumin: 2.3 g/dL — ABNORMAL LOW (ref 3.5–5.0)
Albumin: 2.2 g/dL — ABNORMAL LOW (ref 3.5–5.0)
Alkaline Phosphatase: 99 U/L (ref 38–126)
Alkaline Phosphatase: 105 U/L (ref 38–126)
Anion gap: 4 — ABNORMAL LOW (ref 5–15)
Anion gap: 6 (ref 5–15)
BUN: 32 mg/dL — ABNORMAL HIGH (ref 8–23)
BUN: 39 mg/dL — ABNORMAL HIGH (ref 8–23)
CO2: 27 mmol/L (ref 22–32)
CO2: 25 mmol/L (ref 22–32)
Calcium: 8.1 mg/dL — ABNORMAL LOW (ref 8.9–10.3)
Calcium: 8.2 mg/dL — ABNORMAL LOW (ref 8.9–10.3)
Chloride: 105 mmol/L (ref 98–111)
Chloride: 104 mmol/L (ref 98–111)
Creatinine, Ser: 0.54 mg/dL (ref 0.44–1.00)
Creatinine, Ser: 0.65 mg/dL (ref 0.44–1.00)
GFR, Estimated: >60 mL/min (ref >60)
GFR, Estimated: >60 mL/min (ref >60)
Glucose, Bld: 185 mg/dL — ABNORMAL HIGH (ref 70–99)
Glucose, Bld: 175 mg/dL — ABNORMAL HIGH (ref 70–99)
Potassium: 4 mmol/L (ref 3.5–5.1)
Potassium: 4 mmol/L (ref 3.5–5.1)
Sodium: 136 mmol/L (ref 135–145)
Sodium: 135 mmol/L (ref 135–145)
Total Bilirubin: 0.9 mg/dL (ref 0.3–1.2)
Total Bilirubin: 0.8 mg/dL (ref 0.3–1.2)
Total Protein: 4.9 g/dL — ABNORMAL LOW (ref 6.5–8.1)
Total Protein: 5 g/dL — ABNORMAL LOW (ref 6.5–8.1)

## 2021-04-15 LAB — CBC
HCT: 24 % — ABNORMAL LOW (ref 36.0–46.0)
HCT: 26.1 % — ABNORMAL LOW (ref 36.0–46.0)
Hemoglobin: 8.3 g/dL — ABNORMAL LOW (ref 12.0–15.0)
Hemoglobin: 8.8 g/dL — ABNORMAL LOW (ref 12.0–15.0)
MCH: 28.9 pg (ref 26.0–34.0)
MCH: 28.3 pg (ref 26.0–34.0)
MCHC: 34.6 g/dL (ref 30.0–36.0)
MCHC: 33.7 g/dL (ref 30.0–36.0)
MCV: 83.6 fL (ref 80.0–100.0)
MCV: 83.9 fL (ref 80.0–100.0)
Platelets: 41 10*3/uL — ABNORMAL LOW (ref 150–400)
Platelets: 52 10*3/uL — ABNORMAL LOW (ref 150–400)
RBC: 2.87 MIL/uL — ABNORMAL LOW (ref 3.87–5.11)
RBC: 3.11 MIL/uL — ABNORMAL LOW (ref 3.87–5.11)
RDW: 20.3 % — ABNORMAL HIGH (ref 11.5–15.5)
RDW: 20.9 % — ABNORMAL HIGH (ref 11.5–15.5)
WBC: 14.4 10*3/uL — ABNORMAL HIGH (ref 4.0–10.5)
WBC: 18.9 10*3/uL — ABNORMAL HIGH (ref 4.0–10.5)
nRBC: 0.2 % (ref 0.0–0.2)
nRBC: 0.4 % — ABNORMAL HIGH (ref 0.0–0.2)

## 2021-04-15 LAB — DIFFERENTIAL
Abs Immature Granulocytes: 0.8 10*3/uL — ABNORMAL HIGH (ref 0.00–0.07)
Basophils Absolute: 0 10*3/uL (ref 0.0–0.1)
Basophils Relative: 0 %
Eosinophils Absolute: 0 10*3/uL (ref 0.0–0.5)
Eosinophils Relative: 0 %
Immature Granulocytes: 6 %
Lymphocytes Relative: 1 %
Lymphs Abs: 0.2 10*3/uL — ABNORMAL LOW (ref 0.7–4.0)
Monocytes Absolute: 0.3 10*3/uL (ref 0.1–1.0)
Monocytes Relative: 2 %
Neutro Abs: 13.1 10*3/uL — ABNORMAL HIGH (ref 1.7–7.7)
Neutrophils Relative %: 91 %
Smear Review: NORMAL

## 2021-04-15 LAB — RENAL FUNCTION PANEL
Albumin: 2.3 g/dL — ABNORMAL LOW (ref 3.5–5.0)
Anion gap: 3 — ABNORMAL LOW (ref 5–15)
BUN: 31 mg/dL — ABNORMAL HIGH (ref 8–23)
CO2: 27 mmol/L (ref 22–32)
Calcium: 8 mg/dL — ABNORMAL LOW (ref 8.9–10.3)
Chloride: 105 mmol/L (ref 98–111)
Creatinine, Ser: 0.47 mg/dL (ref 0.44–1.00)
GFR, Estimated: >60 mL/min (ref >60)
Glucose, Bld: 184 mg/dL — ABNORMAL HIGH (ref 70–99)
Phosphorus: 2.7 mg/dL (ref 2.5–4.6)
Potassium: 4 mmol/L (ref 3.5–5.1)
Sodium: 135 mmol/L (ref 135–145)

## 2021-04-15 LAB — BPAM RBC
Blood Product Expiration Date: 202207102359
Blood Product Expiration Date: 202208022359
ISSUE DATE / TIME: 202207101043
ISSUE DATE / TIME: 202207110947
Unit Type and Rh: 6200
Unit Type and Rh: 9500

## 2021-04-15 LAB — TYPE AND SCREEN
ABO/RH(D): A POS
Antibody Screen: NEGATIVE
Unit division: 0
Unit division: 0

## 2021-04-15 LAB — GLUCOSE, CAPILLARY
Glucose-Capillary: 115 mg/dL — ABNORMAL HIGH (ref 70–99)
Glucose-Capillary: 129 mg/dL — ABNORMAL HIGH (ref 70–99)
Glucose-Capillary: 137 mg/dL — ABNORMAL HIGH (ref 70–99)
Glucose-Capillary: 167 mg/dL — ABNORMAL HIGH (ref 70–99)
Glucose-Capillary: 172 mg/dL — ABNORMAL HIGH (ref 70–99)
Glucose-Capillary: 173 mg/dL — ABNORMAL HIGH (ref 70–99)
Glucose-Capillary: 59 mg/dL — ABNORMAL LOW (ref 70–99)

## 2021-04-15 LAB — BPAM PLATELET PHERESIS
Blood Product Expiration Date: 202207132359
Blood Product Expiration Date: 202207142359
Unit Type and Rh: 6200
Unit Type and Rh: 8400

## 2021-04-15 LAB — PREPARE PLATELET PHERESIS
Unit division: 0
Unit division: 0

## 2021-04-15 LAB — HEPATITIS C ANTIBODY: HCV Ab: NONREACTIVE

## 2021-04-15 LAB — HEPATITIS B SURFACE ANTIGEN: Hepatitis B Surface Ag: NONREACTIVE

## 2021-04-15 LAB — MAGNESIUM
Magnesium: 2.4 mg/dL (ref 1.7–2.4)
Magnesium: 2.4 mg/dL (ref 1.7–2.4)

## 2021-04-15 LAB — HEPATITIS B CORE ANTIBODY, TOTAL: Hep B Core Total Ab: NONREACTIVE

## 2021-04-15 LAB — PREALBUMIN: Prealbumin: 12.4 mg/dL — ABNORMAL LOW (ref 18–38)

## 2021-04-15 LAB — PHOSPHORUS
Phosphorus: 2.7 mg/dL (ref 2.5–4.6)
Phosphorus: 2.8 mg/dL (ref 2.5–4.6)

## 2021-04-15 LAB — D-DIMER, QUANTITATIVE: D-Dimer, Quant: 3.39 ug/mL-FEU — ABNORMAL HIGH (ref 0.00–0.50)

## 2021-04-15 LAB — APTT
aPTT: 94 seconds — ABNORMAL HIGH (ref 24–36)
aPTT: 75 seconds — ABNORMAL HIGH (ref 24–36)
aPTT: 73 seconds — ABNORMAL HIGH (ref 24–36)

## 2021-04-15 LAB — HEPATITIS B SURFACE ANTIBODY,QUALITATIVE: Hep B S Ab: NONREACTIVE

## 2021-04-15 MED ORDER — AMIODARONE IV BOLUS ONLY 150 MG/100ML
INTRAVENOUS | Status: AC
  Filled 2021-04-15: qty 100

## 2021-04-15 MED ORDER — AMIODARONE HCL IN DEXTROSE 360-4.14 MG/200ML-% IV SOLN
10.0000 mg/h | INTRAVENOUS | Status: DC
  Administered 2021-04-15 (×2): 30 mg/h via INTRAVENOUS
  Administered 2021-04-16: 15 mg/h via INTRAVENOUS
  Filled 2021-04-15 (×3): qty 200

## 2021-04-15 MED ORDER — ACETAMINOPHEN 10 MG/ML IV SOLN
1000.0000 mg | Freq: Once | INTRAVENOUS | Status: AC
  Administered 2021-04-15: 1000 mg via INTRAVENOUS
  Filled 2021-04-15: qty 100

## 2021-04-15 MED ORDER — FUROSEMIDE 10 MG/ML IJ SOLN
80.0000 mg | Freq: Once | INTRAMUSCULAR | Status: AC
  Administered 2021-04-15: 80 mg via INTRAVENOUS
  Filled 2021-04-15: qty 8

## 2021-04-15 MED ORDER — SODIUM CHLORIDE 0.9% IV SOLUTION: Freq: Once | INTRAVENOUS | Status: AC

## 2021-04-15 MED ORDER — NOREPINEPHRINE 4 MG/250ML-% IV SOLN
0.0000 ug/min | INTRAVENOUS | Status: DC
  Filled 2021-04-15: qty 250

## 2021-04-15 MED ORDER — FLUCONAZOLE IN SODIUM CHLORIDE 200-0.9 MG/100ML-% IV SOLN
200.0000 mg | INTRAVENOUS | Status: DC
  Administered 2021-04-15 – 2021-04-16 (×2): 200 mg via INTRAVENOUS
  Filled 2021-04-15 (×3): qty 100

## 2021-04-15 MED ORDER — SODIUM CHLORIDE 0.9 % IV SOLN
500.0000 mg | INTRAVENOUS | Status: DC
  Administered 2021-04-15 – 2021-04-16 (×2): 500 mg via INTRAVENOUS
  Filled 2021-04-15: qty 500
  Filled 2021-04-15: qty 0.5
  Filled 2021-04-15: qty 500

## 2021-04-15 MED ORDER — JUVEN PO PACK
1.0000 | PACK | Freq: Two times a day (BID) | ORAL | Status: DC
  Administered 2021-04-16 – 2021-04-20 (×10): 1

## 2021-04-15 MED ORDER — PIVOT 1.5 CAL PO LIQD
1000.0000 mL | ORAL | Status: DC
  Administered 2021-04-15 – 2021-04-16 (×2): 1000 mL
  Filled 2021-04-15: qty 1000

## 2021-04-15 MED ORDER — PROSOURCE TF PO LIQD
45.0000 mL | Freq: Three times a day (TID) | ORAL | Status: DC
  Administered 2021-04-15 – 2021-04-20 (×16): 45 mL
  Filled 2021-04-15 (×17): qty 45

## 2021-04-15 MED ORDER — ADULT MULTIVITAMIN LIQUID CH
15.0000 mL | Freq: Every day | ORAL | Status: DC
  Administered 2021-04-16 – 2021-04-20 (×5): 15 mL
  Filled 2021-04-15 (×5): qty 15

## 2021-04-15 MED ORDER — AMIODARONE IV BOLUS ONLY 150 MG/100ML
150.0000 mg | Freq: Once | INTRAVENOUS | Status: AC
  Administered 2021-04-15: 150 mg via INTRAVENOUS

## 2021-04-15 MED ORDER — ACETAMINOPHEN 325 MG PO TABS
650.0000 mg | ORAL_TABLET | ORAL | Status: DC | PRN
  Administered 2021-04-15 – 2021-04-16 (×3): 650 mg via NASOGASTRIC
  Filled 2021-04-15 (×3): qty 2

## 2021-04-15 MED ORDER — DEXTROSE 50 % IV SOLN
INTRAVENOUS | Status: AC
  Filled 2021-04-15: qty 50

## 2021-04-15 MED ORDER — LABETALOL HCL 5 MG/ML IV SOLN
10.0000 mg | Freq: Once | INTRAVENOUS | Status: AC
  Administered 2021-04-15: 10 mg via INTRAVENOUS
  Filled 2021-04-15 (×2): qty 4

## 2021-04-15 MED ORDER — DEXTROSE 50 % IV SOLN
12.5000 g | INTRAVENOUS | Status: AC
  Administered 2021-04-15: 12.5 g via INTRAVENOUS

## 2021-04-15 MED ORDER — ACETAMINOPHEN 650 MG RE SUPP: 650.0000 mg | RECTAL | Status: DC | PRN

## 2021-04-15 MED ORDER — PHENYLEPHRINE CONCENTRATED 100MG/250ML (0.4 MG/ML) INFUSION SIMPLE
0.0000 ug/min | INTRAVENOUS | Status: DC
  Administered 2021-04-15: 200 ug/min via INTRAVENOUS
  Administered 2021-04-16: 10 ug/min via INTRAVENOUS
  Administered 2021-04-17: 5 ug/min via INTRAVENOUS
  Administered 2021-04-17: 10 ug/min via INTRAVENOUS
  Administered 2021-04-18: 20 ug/min via INTRAVENOUS
  Filled 2021-04-15 (×3): qty 250

## 2021-04-15 MED ORDER — AMIODARONE HCL IN DEXTROSE 360-4.14 MG/200ML-% IV SOLN: 30.0000 mg/h | INTRAVENOUS | Status: DC

## 2021-04-15 MED ORDER — TRAVASOL 10 % IV SOLN
INTRAVENOUS | Status: DC
  Filled 2021-04-15: qty 588

## 2021-04-15 NOTE — Progress Notes (Signed)
Pharmacy Heparin Induced Thrombocytopenia (HIT) Note:  Lindsay [name] is an 74 y.o. female being evaluated for HIT. Patient has not received heparin other than 2 doses intracatheter for CRRT on 7/6 and 7/8. Platelets were 461 early 7/3 morning when patient presented and drifted down to 266 later that same day. Continued to decline since admission. Patient has been on CRRT since 7/4.  HIT labs were ordered on 7/10 when platelets dropped to 44.   CALCULATE SCORE:  4Ts (see the HIT Algorithm) Score  Thrombocytopenia 1  Timing 1  Thrombosis 1  Other causes of thrombocytopenia 0  Total 3 - low probability of HIT    Per surgeon's note, concern for DIC or HIT. With limited exposure to heparin during CRRT, question whether thrombocytopenia likely multifactorial in setting of continued CRRT and possible DIC.   Plan (Discussed with provider) Labs ordered:  HIT antibody, SRA pending Heparin allergy:  Heparin allergy documented or updated. Anticoagulation plans:  Begin alternative anticoagulation with argatroban per consult from provider   Goal aPTT: 50-90 seconds  7/11 1101 aPTT = 75 7/11 2137 aPTT = 73   aPTT therapeutic x 2, continue argatroban at 0.45 mcg/kg/min Re-check aPTT with AM labs Daily CBC while on argatroban Continue to follow HIT antibody, [place], PharmD, BCPS Clinical Pharmacist   04/15/2021, 10:28 PM

## 2021-04-15 NOTE — Consult Note (Addendum)
WOC Nurse Consult Note: Vac dressing changed to abd wound.  Pt medicated for pain prior to the procedure and tolerated without apparent discomfort.Pt is critically ill and intubated. Wound type: Surgical full thickness wound to midline abd; refer to previous notes for measurements. Wound bed: 85% red, 15% yellow adipose tissue  Drainage (amount, consistency, odor) small amt serous Periwound:intact Dressing procedure/placement/frequency: one piece of black foam removed from wound, wound cleansed with NS and gently patted dry. One piece of black foam used to obliterate dead space and drape applied. Dressing attached to 175mmHg continuous negative pressure and an immediate seal is achieved. Dressing is labeled.    [place] Nurse ostomy consult note Stoma type/location: left upper quadrant colostomy Stomal assessment/size: oval and slightly below skin level, 1 inch x 1 and 1/4 inch. Red and viable. Peristomal assessment: intact Treatment options for stomal/peristomal skin: skin barrier ring segment Output:  mod amt small amount of dark brown pasty stool at stoma at pouch change. Ostomy pouching: Applied 1pc. flexible convex pouch.  No family members present for teaching. Enrolled patient in [place] program:No   Supplies at the bedside for staff nurse use.   [place] team will continue to follow for Vac dressings and pouch changes. Julien Girt MSN, RN, [place], [place], [place]

## 2021-04-15 NOTE — Consult Note (Signed)
Chief Complaint: Patient was seen in consultation today for intra-abdominal fluid collection aspiration/placement  Referring Physician(s): Ottie Glazier, MD  Supervising Physician: Jacqulynn Cadet  Patient Status: Lindsay [name] - In-pt  History of Present Illness: Lindsay [name] is a 75 y.o. female with a past medical history significant for DM, HTN, CHF, a.fib previously on Xarelto, VF s/p AICD placement, GERD and diverticulitis who presented to [facility] ED on 04/12/2021 with abdominal pain and vomiting x 5 days, as well as inability to urinate x 4 days. Initial evaluation in ED significant for tachypnea, pallor, weakness, WBC 19, plt 461, creatinine 4.81. CT abdomen/pelvis w/o contrast showed bowel perforation with marked inflammatory changes and pneumoperitoneum throughout the abdomen and pelvis. General surgery was consulted and patient underwent emergent ex lap with extensive adhesiolysis, lower anterior resection, descending colostomy and left salpingo-oophorectomy. During the procedure she was found to have roughly 1.5 L of frank pus within the abdominal cavity, multiple loops of bowel adhered to one another creating interloop abscesses and adhesion of the left tube and ovary to the rectosigmoid tissue which could not be separated and thus left salpingo-oophorectomy was performed. She was not noted to have any feculent drainage. Incidental splenic laceration occurred during the procedure and hemostasis was achieved prior to closure. Several surgical drains and a midline wound vac was placed.  Ms. [name] was admitted to the ICU post procedure for continued care of septic shock. She initially required vasopressor support which has been discontinued, however she currently remains intubated and is on CRRT for AKI. She has been placed on argatroban due to concern for DIC/HIT. CT abd/pelvis w/contrast obtained yesterday due to persistent leukocytosis and thrombocytopenia which noted a small to moderate amount  of fluid within the abdomen and pelvis, primarily anteriorly and on the left, possibly loculated and representing infected fluid. IR has been consulted for aspiration/drain placement within this collection.  Patient remains ventilated, sedated, does not respond to verbal/tactile stimuli. Called patient's husband Lindsay [name] to discuss procedure, he tells me that he talked to someone earlier about turing off her ICD and "pulling the plug." He tells me that he "knows what I have to do but I can't do it yet, I want to get some more information today." Initially he wished to hold off on the drain today due to concern for putting [name] through further procedures however after discussion with the primary team via phone he wishes to proceed with drain placement.  Past Medical History:  Diagnosis Date   Diabetes mellitus without complication ([place])    Hypertension     Past Surgical History:  Procedure Laterality Date   APPLICATION OF WOUND VAC N/A 04/08/2021   Procedure: APPLICATION OF WOUND VAC;  Surgeon: Fredirick Maudlin, MD;  Location: [place] [facility];  Service: General;  Laterality: N/A;  [id number]   BREAST BIOPSY Right    2019-benign   BREAST EXCISIONAL BIOPSY Right 2018   COLOSTOMY Left 04/25/2021   Procedure: COLOSTOMY;  Surgeon: Fredirick Maudlin, MD;  Location: [place] [facility];  Service: General;  Laterality: Left;   LAPAROTOMY N/A 04/06/2021   Procedure: EXPLORATORY LAPAROTOMY WITH LOWER ANTERIOR RESECTION;  Surgeon: Fredirick Maudlin, MD;  Location: [place] [facility];  Service: General;  Laterality: N/A;   LYSIS OF ADHESION N/A 04/21/2021   Procedure: LYSIS OF ADHESION;  Surgeon: Fredirick Maudlin, MD;  Location: [facility];  Service: General;  Laterality: N/A;   REDUCTION MAMMAPLASTY Bilateral    2010   SALPINGOOPHORECTOMY Left 04/18/2021   Procedure: OPEN SALPINGO OOPHORECTOMY;  Surgeon: Celine Ahr,  [name] Malta, MD;  Location: [facility];  Service: General;  Laterality: Left;    Allergies: Amiodarone, Levofloxacin, Meloxicam,  Amlodipine, Amlodipine besy-benazepril hcl, Beta adrenergic blockers, and Heparin  Medications: Prior to Admission medications   Medication Sig Start Date End Date Taking? Authorizing Provider  acetaminophen (TYLENOL) 500 MG tablet Take 500 mg by mouth every 6 (six) hours as needed.   Yes [provider]  Alum Hydroxide-Mag Carbonate (GAVISCON PO) Take 5-10 mLs by mouth 3 (three) times daily as needed. Plus or minus 3 hours from metoprolol succinate er   Yes [provider]  Cyanocobalamin (VITAMIN B12 PO) Take 1,000 mcg by mouth daily.   Yes [provider]  esomeprazole (NEXIUM) 20 MG capsule Take 20 mg by mouth daily at 12 noon.   Yes [provider]  losartan (COZAAR) 50 MG tablet Take 50 mg by mouth 2 (two) times daily. 04/30/20  Yes [provider]  metoprolol succinate (TOPROL-XL) 100 MG 24 hr tablet Take 100 mg by mouth 2 (two) times daily. 05/27/20  Yes [provider]  Multiple Vitamins tablet Take 2 tablets by mouth daily. 01/26/20  Yes [provider]  rosuvastatin (CRESTOR) 10 MG tablet Take 10 mg by mouth at bedtime. 04/28/20  Yes [provider]  XARELTO 20 MG TABS tablet Take 20 mg by mouth daily. 04/28/20  Yes [provider]  amiodarone (PACERONE) 200 MG tablet Take by mouth. Patient not taking: No sig reported 05/27/20   [provider]  amoxicillin-clavulanate (AUGMENTIN) 875-125 MG tablet Take 1 tablet by mouth 2 (two) times daily. Patient not taking: No sig reported    [provider]  aspirin 81 MG EC tablet Aspir-81 mg tablet,delayed release  Take 1 tablet every day by oral route. Patient not taking: No sig reported    [provider]  beta carotene w/minerals (OCUVITE) tablet Take 1 tablet by mouth daily. Patient not taking: No sig reported 01/26/20   [provider]  calcium citrate-vitamin D (CITRACAL+D) 315-200 MG-UNIT tablet Take by mouth. Patient not  taking: No sig reported    [provider]  ketoconazole (NIZORAL) 2 % shampoo Shampoo into the scalp let sit 5-10 minutes then wash out. Use 3d/wk. Patient not taking: Reported on 04/10/2021 02/25/21   Ralene Bathe, MD  MAGNESIUM OXIDE PO Take 165 mg by mouth daily. Patient not taking: Reported on 04/28/2021    [provider]  mupirocin ointment (BACTROBAN) 2 % Apply to ulceration QD until healed. Patient not taking: Reported on 04/24/2021 02/25/21   Ralene Bathe, MD     Family History  Problem Relation Age of Onset   Breast cancer Mother     Social History   Socioeconomic History   Marital status: Married    Spouse name: Not on file   Number of children: Not on file   Years of education: Not on file   Highest education level: Not on file  Occupational History   Not on file  Tobacco Use   Smoking status: Never   Smokeless tobacco: Never  Substance and Sexual Activity   Alcohol use: Never   Drug use: Never   Sexual activity: Not Currently  Other Topics Concern   Not on file  Social History Narrative   Not on file   Social Determinants of Health   Financial Resource Strain: Not on file  Food Insecurity: Not on file  Transportation Needs: Not on file  Physical Activity: Not on file  Stress: Not on file  Social Connections: Not on file     Review of Systems: A 12 point ROS discussed and pertinent positives are indicated in the HPI above.  All other systems are negative.  Review of Systems  Unable to perform ROS: Intubated   Vital Signs: BP (!) 167/61   Pulse 73   Temp 98.06 F (36.7 C) (Rectal)   Resp (!) 22   Ht 5\' 5"  (1.651 m)   Wt 180 lb 1.9 oz (81.7 kg)   SpO2 94%   BMI 29.97 kg/m   Physical Exam Vitals and nursing note reviewed.  Constitutional:      Appearance: She is ill-appearing.  Cardiovascular:     Rate and Rhythm: Normal rate and regular rhythm.  Pulmonary:     Comments: (+) vent sounds Abdominal:     Comments: (+)  midline wound vac (+) 2 right sided surgical drains to JP with serosanguineous appearing output (+) LUQ colostomy  Skin:    General: Skin is warm and dry.     MD Evaluation Airway: Other (comments) Airway comments: Ventilated Heart: Other (comments) Heart  comments: AICD Abdomen: Other (comments) Abdomen comments: Midline woud vac, multiple surgical drains Chest/ Lungs: Other (comments) Chest/ lungs comments: Ventilated ASA  Classification: 4 Mallampati/Airway Score:  (Ventilated)   Imaging: CT ABDOMEN PELVIS WO CONTRAST  Result Date: 04/06/2021 CLINICAL DATA:  Nonlocalized acute abdominal pain. Nausea and weakness. Decreased urination over the past 4 days EXAM: CT ABDOMEN AND PELVIS WITHOUT CONTRAST TECHNIQUE: Multidetector CT imaging of the abdomen and pelvis was performed following the standard protocol without IV contrast. COMPARISON:  None. FINDINGS: Lower chest: No acute abnormality. Hepatobiliary: Several similar-appearing fluid density lesions are noted within the liver with the largest measuring up to 2.3 cm. Findings likely represent hepatic cysts. Subcentimeter hypodensities are too small to characterize. No new focal lesion. No gallstones, gallbladder wall thickening, or pericholecystic fluid. No biliary dilatation. Pancreas: No focal lesion. Normal pancreatic contour. No surrounding inflammatory changes. No main pancreatic ductal dilatation. Spleen: Normal in size without focal abnormality. Adrenals/Urinary Tract: No adrenal nodule bilaterally. No nephrolithiasis, no hydronephrosis, and no contour-deforming renal mass. No ureterolithiasis or hydroureter. The urinary bladder is decompressed with a urinary Foley catheter terminating within the lumen. Several foci of gas are noted within the urinary bladder lumen as well as within the Foley catheter. Stomach/Bowel: Stomach is within normal limits. Irregular bowel wall and haziness throughout the abdomen pelvis. Question scattered  colonic diverticula of the skin colon. No definite bowel dilatation. The appendix not definitely identified. Vascular/Lymphatic: No abdominal aorta or iliac aneurysm. Severe atherosclerotic plaque of the aorta and its branches. No abdominal, pelvic, or inguinal lymphadenopathy. Reproductive: Uterus and bilateral adnexa are unremarkable. Other: Large volume pneumoperitoneum with at least small volume free fluid mesenteric fat stranding throughout the abdomen and pelvis. Musculoskeletal: No abdominal wall hernia or abnormality. No suspicious lytic or blastic osseous lesions. No acute displaced fracture. Multilevel degenerative changes of the spine. IMPRESSION: 1. Bowel perforation with marked inflammatory changes and pneumoperitoneum throughout the abdomen and pelvis. Bowel ischemia not excluded. Markedly limited evaluation on this noncontrast study. Recommend emergent surgical consultation. 2. Multiple foci of gas noted within the urinary bladder lumen and Foley catheter tubing. Correlate with urinalysis to exclude infection. These results were called by telephone at the time of interpretation on 04/06/2021 at 5:19 am to provider [facility] , who verbally acknowledged these results. Electronically Signed   By: Iven Finn M.D.   On:  04/05/2021 05:28   CT ABDOMEN PELVIS W CONTRAST  Result Date: 04/14/2021 CLINICAL DATA:  75 year old female with recent bowel perforation and partial colectomy and colostomy. Continued sepsis. Small splenic laceration during surgery. EXAM: CT ABDOMEN AND PELVIS WITH CONTRAST TECHNIQUE: Multidetector CT imaging of the abdomen and pelvis was performed using the standard protocol following bolus administration of intravenous contrast. CONTRAST:  10mL OMNIPAQUE IOHEXOL 300 MG/ML  SOLN COMPARISON:  04/05/2021 CT FINDINGS: Lower chest: Moderate bilateral pleural effusions and bilateral LOWER lung atelectasis noted. Airspace opacities within the lingula and LEFT LOWER lobe are  likely represent pneumonia. Cardiomegaly and pacemaker/AICD again noted. Hepatobiliary: Hepatic cysts are again noted without other hepatic abnormality. The patient is status post cholecystectomy. No biliary dilatation identified. Pancreas: Unremarkable Spleen: A 2 cm hypodensity along the posterolateral spleen is noted which may be related to operative injury versus infection. Adrenals/Urinary Tract: The kidneys and adrenal glands are unremarkable. A Foley catheter is present within the bladder. Stomach/Bowel: LEFT sided ostomy is noted. No dilated bowel loops/bowel obstruction noted. There is no evidence of pneumoperitoneum. Mild circumferential small bowel wall enhancement/possible thickening may be reactive. Vascular/Lymphatic: Aortic atherosclerosis. No enlarged abdominal or pelvic lymph nodes. Reproductive: Uterus and bilateral adnexa are unremarkable. Other: Surgical drains within the abdomen are noted. There is a small to moderate amount of fluid, primarily anteriorly and on the LEFT, which may be loculated and likely represents infected fluid. Other scattered areas of ill-defined fluid within the abdomen and pelvis noted, some adjacent to small bowel loops. No gross pneumoperitoneum is identified. Diffuse subcutaneous edema is noted. Musculoskeletal: No acute or suspicious bony abnormalities identified. IMPRESSION: 1. Small to moderate amount of fluid within the abdomen and pelvis, primarily anteriorly and on the LEFT, which may be loculated and probably represent infected fluid. Other scattered areas of ill-defined fluid within the abdomen and pelvis, some adjacent to small bowel loops. No gross pneumoperitoneum. 2. Airspace opacities within the lingula and LEFT LOWER lobe likely represent pneumonia. 3. Moderate bilateral pleural effusions and bilateral LOWER lung atelectasis. 4. 2 cm hypodensity along the posterolateral spleen which may be related to operative injury versus infection. 5. Mild small bowel  wall thickening/enhancement which may be reactive. No evidence of bowel obstruction or pneumoperitoneum. 6. Aortic Atherosclerosis (ICD10-I70.0). Electronically Signed   By: Margarette Canada M.D.   On: 04/14/2021 16:14   DG Chest Port 1 View  Result Date: 04/15/2021 CLINICAL DATA:  ETT placement. EXAM: PORTABLE CHEST 1 VIEW COMPARISON:  04/08/2021 FINDINGS: 1124 hours. Endotracheal tube tip is 4.8 cm above the base of the carina. The NG tube passes into the stomach although the distal tip position is not included on the film. Left IJ central line tip overlies the lower right atrium given leftward patient rotation. Right IJ central line tip also noted region of lower right atrium. Interval progression of vascular congestion with worsening bibasilar atelectasis or infiltrate. Small bilateral pleural effusions noted. Surgical drain identified left upper abdomen. IMPRESSION: 1. Interval progression of vascular congestion with worsening bibasilar atelectasis or infiltrate. 2. Small bilateral pleural effusions. 3. Stable support apparatus. Electronically Signed   By: Misty Stanley M.D.   On: 04/15/2021 11:46   DG Chest Port 1 View  Result Date: 04/08/2021 CLINICAL DATA:  Central line placement. EXAM: PORTABLE CHEST 1 VIEW COMPARISON:  Radiograph earlier today. FINDINGS: Large-bore left-sided internal jugular catheter is low in positioning at the atrial IVC junction, at or possibly below the diaphragm. No pneumothorax. Right internal central line, endotracheal tube,  enteric tube, left-sided pacemaker remain in place. Stable heart size and mediastinal contours. Mild hazy left lung base opacity likely small pleural effusion and atelectasis. Presumed surgical drain in the left upper quadrant. No pulmonary edema. IMPRESSION: 1. Left internal jugular central line with tip low in positioning at the atrial IVC junction, either at or possibly below the diaphragm. No pneumothorax. 2. Remaining support apparatus are stable. 3.  Hazy left lung base opacity likely small pleural effusion and atelectasis. Electronically Signed   By: Keith Rake M.D.   On: 04/08/2021 16:54   Portable Chest xray  Result Date: 04/08/2021 CLINICAL DATA:  Respiratory failure. History of diabetes and hypertension. Follow-up exam. EXAM: PORTABLE CHEST 1 VIEW COMPARISON:  04/29/2021. FINDINGS: Cardiac silhouette normal in size.  No mediastinal or hilar masses. Stable prominent bronchovascular markings bilaterally. No convincing pneumonia or pulmonary edema. Endotracheal tube, nasal/orogastric tube, right internal jugular central venous catheter and left anterior chest wall AICD are all stable. IMPRESSION: 1. No acute cardiopulmonary disease. 2. Support apparatus is stable and well positioned. Electronically Signed   By: Lajean Manes M.D.   On: 04/08/2021 07:46   DG Chest Port 1 View  Result Date: 05/03/2021 CLINICAL DATA:  Respiratory failure. EXAM: PORTABLE CHEST 1 VIEW COMPARISON:  May 30, 2020. FINDINGS: The heart size and mediastinal contours are within normal limits. Both lungs are clear. Endotracheal and nasogastric tubes are in good position. Left-sided pacemaker is unchanged in position. Right internal jugular catheter is in good position. The visualized skeletal structures are unremarkable. IMPRESSION: No active disease. Aortic Atherosclerosis (ICD10-I70.0). Electronically Signed   By: Marijo Conception M.D.   On: 05/01/2021 13:48   ECHOCARDIOGRAM COMPLETE  Result Date: 04/08/2021    ECHOCARDIOGRAM REPORT   Patient Name:   ALLINA RICHES Date of Exam: 04/08/2021 Medical Rec #:  [id number]     Height:       65.0 in Accession #:    [id number]    Weight:       140.0 lb Date of Birth:  03/03/1946    BSA:          1.700 m Patient Age:    10 years      BP:           144/86 mmHg Patient Gender: F             HR:           112 bpm. Exam Location:  [facility] Procedure: 2D Echo Indications:     Hypertrophic Obstructive Cardiomyopathy  History:         Patient  has no prior history of Echocardiogram examinations.                  Arrythmias:Atrial Fibrillation.  Sonographer:     Carlean Jews Thornton-Maynard Referring Phys:  [id number] Tyler Pita Diagnosing Phys: Bartholome Bill MD IMPRESSIONS  1. Left ventricular ejection fraction, by estimation, is 65 to 70%. The left ventricle has normal function. The left ventricle has no regional wall motion abnormalities. There is moderate asymmetric left ventricular hypertrophy of the basal-septal segment. Left ventricular diastolic parameters were normal.  2. Right ventricular systolic function is normal. The right ventricular size is normal. There is normal pulmonary artery systolic pressure.  3. Left atrial size was mildly dilated.  4. Right atrial size was mildly dilated.  5. A small pericardial effusion is present. The pericardial effusion is posterior to the left ventricle and the left atrium. There is no evidence of  cardiac tamponade.  6. The mitral valve is grossly normal. Moderate mitral valve regurgitation.  7. The aortic valve was not well visualized. Aortic valve regurgitation is trivial. FINDINGS  Left Ventricle: Left ventricular ejection fraction, by estimation, is 65 to 70%. The left ventricle has normal function. The left ventricle has no regional wall motion abnormalities. The left ventricular internal cavity size was normal in size. There is  moderate asymmetric left ventricular hypertrophy of the basal-septal segment. Left ventricular diastolic parameters were normal. Right Ventricle: The right ventricular size is normal. No increase in right ventricular wall thickness. Right ventricular systolic function is normal. There is normal pulmonary artery systolic pressure. The tricuspid regurgitant velocity is 2.38 m/s, and  with an assumed right atrial pressure of 3 mmHg, the estimated right ventricular systolic pressure is 62.2 mmHg. Left Atrium: Left atrial size was mildly dilated. Right Atrium: Right atrial size was mildly  dilated. Pericardium: A small pericardial effusion is present. The pericardial effusion is posterior to the left ventricle and the left atrium. There is no evidence of cardiac tamponade. Mitral Valve: The mitral valve is grossly normal. Moderate mitral valve regurgitation. MV peak gradient, 6.5 mmHg. The mean mitral valve gradient is 3.0 mmHg. Tricuspid Valve: The tricuspid valve is grossly normal. Tricuspid valve regurgitation is mild. Aortic Valve: The aortic valve was not well visualized. Aortic valve regurgitation is trivial. Aortic valve mean gradient measures 4.0 mmHg. Aortic valve peak gradient measures 6.6 mmHg. Aortic valve area, by VTI measures 2.50 cm. Pulmonic Valve: The pulmonic valve was not well visualized. Pulmonic valve regurgitation is trivial. Aorta: The aortic root is normal in size and structure. IAS/Shunts: The atrial septum is grossly normal. Additional Comments: A device lead is visualized.  LEFT VENTRICLE PLAX 2D LVIDd:         2.29 cm  Diastology LVIDs:         1.54 cm  LV e' medial:    5.33 cm/s LV PW:         1.84 cm  LV E/e' medial:  22.6 LV IVS:        2.19 cm  LV e' lateral:   6.74 cm/s LVOT diam:     2.00 cm  LV E/e' lateral: 17.9 LV SV:         52 LV SV Index:   30 LVOT Area:     3.14 cm  RIGHT VENTRICLE RV S prime:     8.59 cm/s LEFT ATRIUM             Index       RIGHT ATRIUM           Index LA diam:        4.30 cm 2.53 cm/m  RA Area:     20.10 cm LA Vol (A2C):   80.4 ml 47.30 ml/m RA Volume:   54.80 ml  32.24 ml/m LA Vol (A4C):   97.8 ml 57.53 ml/m LA Biplane Vol: 88.8 ml 52.24 ml/m  AORTIC VALVE                   PULMONIC VALVE AV Area (Vmax):    2.82 cm    PV Vmax:       1.14 m/s AV Area (Vmean):   2.67 cm    PV Peak grad:  5.2 mmHg AV Area (VTI):     2.50 cm AV Vmax:           128.00 cm/s AV Vmean:  89.700 cm/s AV VTI:            0.207 m AV Peak Grad:      6.6 mmHg AV Mean Grad:      4.0 mmHg LVOT Vmax:         115.00 cm/s LVOT Vmean:        76.200 cm/s LVOT  VTI:          0.165 m LVOT/AV VTI ratio: 0.80  AORTA Ao Root diam: 2.80 cm MITRAL VALVE                 TRICUSPID VALVE MV Area (PHT): 2.86 cm      TR Peak grad:   22.7 mmHg MV Area VTI:   2.73 cm      TR Vmax:        238.00 cm/s MV Peak grad:  6.5 mmHg MV Mean grad:  3.0 mmHg      SHUNTS MV Vmax:       1.27 m/s      Systemic VTI:  0.16 m MV Vmean:      86.2 cm/s     Systemic Diam: 2.00 cm MR Peak grad:    69.2 mmHg MR Mean grad:    41.0 mmHg MR Vmax:         416.00 cm/s MR Vmean:        298.0 cm/s MR PISA:         3.08 cm MR PISA Eff ROA: 51 mm MR PISA Radius:  0.70 cm MV E velocity: 120.33 cm/s Bartholome Bill MD Electronically signed by Bartholome Bill MD Signature Date/Time: 04/08/2021/3:42:55 PM    Final     Labs:  CBC: Recent Labs    04/13/21 0432 04/14/21 0450 04/14/21 1344 04/15/21 0454 04/15/21 1101  WBC 33.4* 23.4*  --  14.4* 18.9*  HGB 7.6* 6.8* 7.8* 8.3* 8.8*  HCT 23.4* 20.3* 23.4* 24.0* 26.1*  PLT 59* 44*  --  41* 52*    COAGS: Recent Labs    05/03/2021 1256 04/12/21 0536 04/14/21 0450 04/14/21 1344 04/14/21 2025 04/15/21 0454 04/15/21 1101  INR 3.3* 1.4* 1.4* 1.3*  --   --   --   APTT 34  --   --   --  70* 94* 75*    BMP: Recent Labs    05/08/20 2040 05/09/20 0233 05/30/20 1000 05/01/2021 0348 04/14/21 0450 04/14/21 1344 04/15/21 0454 04/15/21 1101  NA 139 140 139   < > 133* 134* 136  135 135  K 3.7 4.0 3.4*   < > 4.2 4.0 4.0  4.0 4.0  CL 104 106 103   < > 104 103 105  105 104  CO2 23 27 25    < > 26 25 27  27 25   GLUCOSE 145* 179* 145*   < > 199* 194* 185*  184* 175*  BUN 15 13 17    < > 27* 28* 32*  31* 39*  CALCIUM 8.9 8.4* 9.1   < > 7.8* 8.1* 8.1*  8.0* 8.2*  CREATININE 0.95 0.91 1.33*   < > 0.59 0.54 0.54  0.47 0.65  GFRNONAA 59* >60 39*   < > >60 >60 >60  >60 >60  GFRAA >60 >60 46*  --   --   --   --   --    < > = values in this interval not displayed.    LIVER FUNCTION TESTS: Recent Labs    04/11/21 0404 04/11/21 1616 04/12/21 0536  04/12/21 1704 04/14/21 0450 04/14/21 1344 04/15/21 0454 04/15/21 1101  BILITOT 0.7  --  0.7  --   --   --  0.9 0.8  AST 16  --  14*  --   --   --  20 22  ALT 19  --  14  --   --   --  16 17  ALKPHOS 63  --  69  --   --   --  99 105  PROT 4.5*  --  4.2*  --   --   --  4.9* 5.0*  ALBUMIN 1.7*  1.8*   < > 1.7*   < > 2.1* 2.5* 2.3*  2.3* 2.2*   < > = values in this interval not displayed.    TUMOR MARKERS: No results for input(s): AFPTM, CEA, CA199, CHROMGRNA in the last 8760 hours.  Assessment and Plan:  75 y/o F admitted 04/21/2021 with perforated bowel requiring emergent ex lap with extensive adhesiolysis, lower anterior resection, descending colostomy and left salpingo-oophorectomy currently admitted to ICU on ventilatory support/CRRT for ongoing sepsis seen today for intra-abdominal fluid collection aspiration/drain placement.  Patient history and imaging reviewed by Dr. Laurence Ferrari who approves procedure - plan to proceed with drain placement this afternoon in CT. Argatroban to be held until post procedure, currently on TPN. Afebrile, WBC 18.9, hgb 8.8, plt 52, INR 1.3 (7/10), aPTT 75 . Currently receiving Merrem Q24H and Diflucan Q24H per primary team, no further abx planned pre-procedure.  Risks and benefits discussed with the patient's husband Zanyiah Posten via phone including bleeding, infection, damage to adjacent structures, bowel perforation/fistula connection, and sepsis.  All of the patient's husband's questions were answered, patient's husband is agreeable to proceed.  Verbal consent obtained and witnessed, consent placed in chart.  Thank you for this interesting consult.  I greatly enjoyed meeting Drianna Chandran and look forward to participating in their care.  A copy of this report was sent to the requesting provider on this date.  Electronically Signed: Joaquim Nam, PA-C 04/15/2021, 1:23 PM   I spent a total of 40 Minutes in face to face in clinical consultation,  greater than 50% of which was counseling/coordinating care for abdominal abscess drain placement.

## 2021-04-15 NOTE — Progress Notes (Signed)
[facility] Day(s): 8.   Post op day(s): 8 Days Post-Op.   Interval History:  Patient seen and examined Off vasopressors; issues with agitation requiring sedation Leukocytosis improving; down to 14.4K Hgb up to 8.3 Platelets continue to be low at 41K; HIT pending  Renal function remains within normal limits; sCr - 0.47; UO - 1 cc; on CRRT No significant electrolyte derangements Surgical drains             - RUQ: 6 ccs out; serosanguinous             - RLQ: 1 ccs out; serosanguinous Colostomy retracted, stool in bag Wound vac to midline wound; WOC to follow  On TPN (70 ml/hr) + TF (30 ml/hr) On Meropenem; ID following  Vital signs in last 24 hours: [min-max] current  Temp:  [92.3 F (33.5 C)-93.92 F (34.4 C)] 92.5 F (33.6 C) (07/11 0700) Pulse Rate:  [43-102] 70 (07/11 0700) Resp:  [18-27] 20 (07/11 0700) BP: (102-160)/(47-80) 128/59 (07/11 0700) SpO2:  [93 %-100 %] 95 % (07/11 0700) Arterial Line BP: (122-260)/(38-86) 168/55 (07/11 0700) FiO2 (%):  [24 %] 24 % (07/11 0400)     Height: 5\' 5"  (165.1 cm) Weight: 81.7 kg BMI (Calculated): 29.97   Intake/Output last 2 shifts:  07/10 0701 - 07/11 0700 In: 4187 [I.V.:2566.9; Blood:333.5; NG/GT:445; IV Piggyback:781.6] Out: 5452.9 [Urine:1; Drains:7; Stool:325]   Physical Exam:  Constitutional: Intubated; Sedated HEENT: NGT in place; tube feedings Respiratory: on ventilator  Cardiovascular: Regular rate; few PVCs Gastrointestinal: Soft, unable to assess tenderness; non-distended, no rebound/guarding. 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; patent; retracted, stool in bag, Surgical drains in RUQ and RLQ with serosanguinous output Genitourinary: Foley in place  Integumentary: Midline wound, measuring 21 x 6 x 3 cm, Wound vac in place  Labs:  CBC Latest Ref Rng & Units 04/15/2021 04/14/2021 04/14/2021  WBC 4.0 - 10.5 K/uL 14.4(H) - 23.4(H)  Hemoglobin 12.0 - 15.0 g/dL 8.3(L) 7.8(L)  6.8(L)  Hematocrit 36.0 - 46.0 % 24.0(L) 23.4(L) 20.3(L)  Platelets 150 - 400 K/uL 41(L) - 44(L)   CMP Latest Ref Rng & Units 04/15/2021 04/15/2021 04/14/2021  Glucose 70 - 99 mg/dL 185(H) 184(H) 194(H)  BUN 8 - 23 mg/dL 32(H) 31(H) 28(H)  Creatinine 0.44 - 1.00 mg/dL 0.54 0.47 0.54  Sodium 135 - 145 mmol/L 136 135 134(L)  Potassium 3.5 - 5.1 mmol/L 4.0 4.0 4.0  Chloride 98 - 111 mmol/L 105 105 103  CO2 22 - 32 mmol/L 27 27 25   Calcium 8.9 - 10.3 mg/dL 8.1(L) 8.0(L) 8.1(L)  Total Protein 6.5 - 8.1 g/dL 4.9(L) - -  Total Bilirubin 0.3 - 1.2 mg/dL 0.9 - -  Alkaline Phos 38 - 126 U/L 99 - -  AST 15 - 41 U/L 20 - -  ALT 0 - 44 U/L 16 - -    Imaging studies: No new pertinent imaging studies   Assessment/Plan:  75 y.o. female requiring ventilator support 8 Days Post-Op s/p exploratory laparotomy, hartman's procedure, salpingo-oopherectomy, and placement of wound vac for perforated diverticulitis with frankly purulent peritonitis   - Appreciate PCCM assistance; ventilator and vasopressor support pre their service - Appreciate nephrology assistance; CRRT  - Continue TPN; at goal, okay to wean once tube feedings to goal - Continue tube feedings;. Advance to goal as tolerated - Continue IV Abx (Meropenem); ID following - Monitor abdominal examination; on-going colostomy function              -  Midline wound care: Wound vac; MWF schedule --> WOC will take over; appreciate their assistance             - Pain control prn - Monitor leukocytosis; improved - Monitor platelets; HIT pending              - Monitor renal function; improved but without UO    All of the above findings and recommendations were discussed with the medical team.   -- Edison Simon, PA-C [facility] 04/15/2021, 7:40 AM [phone] M-F: 7am - 4pm

## 2021-04-15 NOTE — Progress Notes (Signed)
Pharmacy Heparin Induced Thrombocytopenia (HIT) Note:  Lindsay [name] is an 75 y.o. female being evaluated for HIT. Patient has not received heparin other than 2 doses intracatheter for CRRT on 7/6 and 7/8. Platelets were 461 early 7/3 morning when patient presented and drifted down to 266 later that same day. Continued to decline since admission. Patient has been on CRRT since 7/4.  HIT labs were ordered on 7/10 when platelets dropped to 44.   CALCULATE SCORE:  4Ts (see the HIT Algorithm) Score  Thrombocytopenia 1  Timing 1  Thrombosis 1  Other causes of thrombocytopenia 0  Total 3 - low probability of HIT    Per surgeon's note, concern for DIC or HIT. With limited exposure to heparin during CRRT, question whether thrombocytopenia likely multifactorial in setting of continued CRRT and possible DIC.   Plan (Discussed with provider) Labs ordered:  HIT antibody, SRA pending Heparin allergy:  Heparin allergy documented or updated. Anticoagulation plans:  Begin alternative anticoagulation with argatroban per consult from provider  Goal aPTT: 50-90 seconds  aPTT therapeutic x 1, continue argatroban at 0.45 mcg/kg/min Re-check aPTT in 4 hours Daily CBC while on argatroban Continue to follow HIT antibody, Burnis Kingfisher 04/15/2021, 11:43 AM

## 2021-04-15 NOTE — Progress Notes (Signed)
Patient remains clinically unchanged post placement 10 FR abscess drain per Dr Laurence Ferrari. Patient on ventilator with respiratory therapy as well as  Sheran Luz from CCU monitoring patient's vitals and status during and post procedure. Serous fluid removed with specimen sent to lab.

## 2021-04-15 NOTE — Progress Notes (Signed)
[facility]  ROUNDING NOTE   Subjective:   On CRRT. Net 1 1.1 liters.  Off vasopressors.   Wbc 14.4 (23.4) (33.4) (46.8)  PRBC transfusion yesterday.   Amiodarone gtt TPN  Objective:  Vital signs in last 24 hours:  Temp:  [92.3 F (33.5 C)-93.92 F (34.4 C)] 92.5 F (33.6 C) (07/11 0700) Pulse Rate:  [43-102] 70 (07/11 0700) Resp:  [18-27] 20 (07/11 0700) BP: (102-160)/(47-80) 128/59 (07/11 0700) SpO2:  [93 %-100 %] 94 % (07/11 0800) Arterial Line BP: (122-260)/(38-86) 168/55 (07/11 0700) FiO2 (%):  [24 %] 24 % (07/11 0800)  Weight change:  Filed Weights   04/12/21 0318 04/13/21 0500 04/14/21 0417  Weight: 79.1 kg 81.7 kg 81.7 kg    Intake/Output: I/O last 3 completed shifts: In: 6170.7 [I.V.:3820.6; Blood:333.5; Other:200; NG/GT:835; IV Piggyback:981.6] Out: 7433.9 [Urine:1; Drains:57; Other:7050.9; Stool:325]   Intake/Output this shift:  Total I/O In: 688.8 [I.V.:196.5; NG/GT:474; IV Piggyback:18.3] Out: 244 [Urine:5; Other:239]  Physical Exam: General: Critically ill  Head: ETT  Eyes: Anicteric  Neck: Supple  Lungs:  PRVC fiO2 24%  Heart: irregular  Abdomen:  +midline incision  Extremities: Trace dependent edema  Neurologic: Intubated, sedated  Skin: No acute rash  Access: Left IJ temporary dialysis catheter    Basic Metabolic Panel: Recent Labs  Lab 04/11/21 0404 04/11/21 1616 04/12/21 0403 04/12/21 1704 04/13/21 0432 04/13/21 1640 04/14/21 0450 04/14/21 1344 04/15/21 0454  NA 134*  135   < > 133*   < > 130* 135 133* 134* 136  135  K 3.9  3.9   < > 4.1   < > 4.6 4.0 4.2 4.0 4.0  4.0  CL 101  101   < > 102   < > 104 104 104 103 105  105  CO2 29  28   < > 26   < > '24 26 26 25 27  27  ' GLUCOSE 211*  210*   < > 276*   < > 204* 209* 199* 194* 185*  184*  BUN 17  17   < > 15   < > 21 22 27* 28* 32*  31*  CREATININE 0.78  0.78   < > 0.68   < > 0.83 0.63 0.59 0.54 0.54  0.47  CALCIUM 7.4*  7.3*   < > 7.2*   < > 7.5*  7.5* 7.8* 8.1* 8.1*  8.0*  MG 2.3  --  2.3  --  2.3  --  2.4  --  2.4  PHOS 2.2*  2.2*   < > 2.9   < > 2.9  2.9 1.8* 2.2*  2.3* 2.4* 2.7  2.7   < > = values in this interval not displayed.     Liver Function Tests: Recent Labs  Lab 04/09/21 0521 04/09/21 1601 04/11/21 0404 04/11/21 1616 04/12/21 0536 04/12/21 1704 04/13/21 0432 04/13/21 1640 04/14/21 0450 04/14/21 1344 04/15/21 0454  AST 38  --  16  --  14*  --   --   --   --   --  20  ALT 31  --  19  --  14  --   --   --   --   --  16  ALKPHOS 44  --  63  --  69  --   --   --   --   --  99  BILITOT 1.3*  --  0.7  --  0.7  --   --   --   --   --  0.9  PROT 4.3*  --  4.5*  --  4.2*  --   --   --   --   --  4.9*  ALBUMIN 2.0*  2.0*   < > 1.7*  1.8*   < > 1.7*   < > 1.7* 2.1* 2.1* 2.5* 2.3*  2.3*   < > = values in this interval not displayed.    No results for input(s): LIPASE, AMYLASE in the last 168 hours.  No results for input(s): AMMONIA in the last 168 hours.  CBC: Recent Labs  Lab 04/09/21 0521 04/10/21 0312 04/11/21 0404 04/12/21 0403 04/13/21 0432 04/14/21 0450 04/14/21 1344 04/15/21 0454  WBC 19.3* 21.8* 38.9* 46.8* 33.4* 23.4*  --  14.4*  NEUTROABS 18.5* 20.4*  --  42.6*  --   --   --  13.1*  HGB 8.3* 8.0* 10.1* 9.2* 7.6* 6.8* 7.8* 8.3*  HCT 24.2* 23.8* 31.2* 28.5* 23.4* 20.3* 23.4* 24.0*  MCV 82.9 83.2 86.9 86.4 86.7 84.2  --  83.6  PLT 131* 120* 97* 72* 59* 44*  --  41*     Cardiac Enzymes: No results for input(s): CKTOTAL, CKMB, CKMBINDEX, TROPONINI in the last 168 hours.  BNP: Invalid input(s): POCBNP  CBG: Recent Labs  Lab 04/14/21 1909 04/14/21 1952 04/14/21 2352 04/15/21 0405 04/15/21 0735  GLUCAP 176* 170* 194* 173* 167*     Microbiology: Results for orders placed or performed during the hospital encounter of 04/05/2021  Resp Panel by RT-PCR (Flu A&B, Covid) Nasopharyngeal Swab     Status: None   Collection Time: 04/28/2021  4:40 AM   Specimen: Nasopharyngeal Swab;  Nasopharyngeal(NP) swabs in vial transport medium  Result Value Ref Range Status   SARS Coronavirus 2 by RT PCR NEGATIVE NEGATIVE Final    Comment: (NOTE) SARS-CoV-2 target nucleic acids are NOT DETECTED.  The SARS-CoV-2 RNA is generally detectable in upper respiratory specimens during the acute phase of infection. The lowest concentration of SARS-CoV-2 viral copies this assay can detect is 138 copies/mL. A negative result does not preclude SARS-Cov-2 infection and should not be used as the sole basis for treatment or other patient management decisions. A negative result may occur with  improper specimen collection/handling, submission of specimen other than nasopharyngeal swab, presence of viral mutation(s) within the areas targeted by this assay, and inadequate number of viral copies(<138 copies/mL). A negative result must be combined with clinical observations, patient history, and epidemiological information. The expected result is Negative.  Fact Sheet for Patients:  EntrepreneurPulse.com.au  Fact Sheet for Healthcare Providers:  IncredibleEmployment.be  This test is no t yet approved or cleared by the [place] FDA and  has been authorized for detection and/or diagnosis of SARS-CoV-2 by FDA under an Emergency Use Authorization (EUA). This EUA will remain  in effect (meaning this test can be used) for the duration of the COVID-19 declaration under Section 564(b)(1) of the Act, 21 U.S.C.section 360bbb-3(b)(1), unless the authorization is terminated  or revoked sooner.       Influenza A by PCR NEGATIVE NEGATIVE Final   Influenza B by PCR NEGATIVE NEGATIVE Final    Comment: (NOTE) The Xpert Xpress SARS-CoV-2/FLU/RSV plus assay is intended as an aid in the diagnosis of influenza from Nasopharyngeal swab specimens and should not be used as a sole basis for treatment. Nasal washings and aspirates are unacceptable for Xpert Xpress  SARS-CoV-2/FLU/RSV testing.  Fact Sheet for Patients: EntrepreneurPulse.com.au  Fact Sheet for Healthcare Providers: IncredibleEmployment.be  This test is  not yet approved or cleared by the [place] and has been authorized for detection and/or diagnosis of SARS-CoV-2 by FDA under an Emergency Use Authorization (EUA). This EUA will remain in effect (meaning this test can be used) for the duration of the COVID-19 declaration under Section 564(b)(1) of the Act, 21 U.S.C. section 360bbb-3(b)(1), unless the authorization is terminated or revoked.  Performed at [facility], [address] [place], [place] [address]   Blood culture (routine x 2)     Status: None   Collection Time: 05/05/2021  5:33 AM   Specimen: BLOOD  Result Value Ref Range Status   Specimen Description BLOOD LEFT ANTECUBITAL  Final   Special Requests   Final    BOTTLES DRAWN AEROBIC AND ANAEROBIC Blood Culture adequate volume   Culture   Final    NO GROWTH 5 DAYS Performed at [facility], [address] [place], [place] [address]    Report Status 04/12/2021 FINAL  Final  Blood culture (routine x 2)     Status: None   Collection Time: 04/08/2021  7:40 AM   Specimen: BLOOD  Result Value Ref Range Status   Specimen Description BLOOD BLOOD RIGHT WRIST  Final   Special Requests   Final    BOTTLES DRAWN AEROBIC AND ANAEROBIC Blood Culture adequate volume   Culture   Final    NO GROWTH 5 DAYS Performed at [facility], [address] [place], [place] [address]    Report Status 04/12/2021 FINAL  Final  MRSA Next Gen by PCR, Nasal     Status: None   Collection Time: 04/28/2021 12:50 PM   Specimen: Nasal Mucosa; Nasal Swab  Result Value Ref Range Status   MRSA by PCR Next Gen NOT DETECTED NOT DETECTED Final    Comment: (NOTE) The GeneXpert MRSA Assay (FDA approved for NASAL specimens only), is one component of a comprehensive MRSA  colonization surveillance program. It is not intended to diagnose MRSA infection nor to guide or monitor treatment for MRSA infections. Test performance is not FDA approved in patients less than 61 years old. Performed at [facility], [address], [place], [place] [address]   Body fluid culture w Gram Stain     Status: None   Collection Time: 04/10/21  4:30 PM   Specimen: JP Drain; Body Fluid  Result Value Ref Range Status   Specimen Description   Final    JP DRAINAGE Performed at [facility], [address] [place], [place] [address]    Special Requests   Final    Normal Performed at [facility], [address] [place], [place] [address]    Gram Stain   Final    ABUNDANT WBC PRESENT, PREDOMINANTLY PMN NO ORGANISMS SEEN    Culture   Final    RARE KLEBSIELLA PNEUMONIAE CRITICAL RESULT CALLED TO, READ BACK BY AND VERIFIED WITH: RN E.JENON AT 0945 ON 04/11/2021 BY T.SAAD. Performed at [facility], [place] [place], [place] [address]    Report Status 04/14/2021 FINAL  Final   Organism ID, Bacteria KLEBSIELLA PNEUMONIAE  Final      Susceptibility   Klebsiella pneumoniae - MIC*    AMPICILLIN RESISTANT Resistant     CEFAZOLIN <=4 SENSITIVE Sensitive     CEFEPIME <=0.12 SENSITIVE Sensitive     CEFTAZIDIME <=1 SENSITIVE Sensitive     CEFTRIAXONE <=0.25 SENSITIVE Sensitive     CIPROFLOXACIN <=0.25 SENSITIVE Sensitive  GENTAMICIN <=1 SENSITIVE Sensitive     IMIPENEM <=0.25 SENSITIVE Sensitive     TRIMETH/SULFA <=20 SENSITIVE Sensitive     AMPICILLIN/SULBACTAM <=2 SENSITIVE Sensitive     PIP/TAZO <=4 SENSITIVE Sensitive     * RARE KLEBSIELLA PNEUMONIAE    Coagulation Studies: Recent Labs    04/14/21 0450 04/14/21 1344  LABPROT 16.8* 16.3*  INR 1.4* 1.3*     Urinalysis: No results for input(s): COLORURINE, LABSPEC, PHURINE, GLUCOSEU, HGBUR, BILIRUBINUR, KETONESUR, PROTEINUR, UROBILINOGEN, NITRITE, LEUKOCYTESUR in the  last 72 hours.  Invalid input(s): APPERANCEUR     Imaging: CT ABDOMEN PELVIS W CONTRAST  Result Date: 04/14/2021 CLINICAL DATA:  75 year old female with recent bowel perforation and partial colectomy and colostomy. Continued sepsis. Small splenic laceration during surgery. EXAM: CT ABDOMEN AND PELVIS WITH CONTRAST TECHNIQUE: Multidetector CT imaging of the abdomen and pelvis was performed using the standard protocol following bolus administration of intravenous contrast. CONTRAST:  53m OMNIPAQUE IOHEXOL 300 MG/ML  SOLN COMPARISON:  05/03/2021 CT FINDINGS: Lower chest: Moderate bilateral pleural effusions and bilateral LOWER lung atelectasis noted. Airspace opacities within the lingula and LEFT LOWER lobe are likely represent pneumonia. Cardiomegaly and pacemaker/AICD again noted. Hepatobiliary: Hepatic cysts are again noted without other hepatic abnormality. The patient is status post cholecystectomy. No biliary dilatation identified. Pancreas: Unremarkable Spleen: A 2 cm hypodensity along the posterolateral spleen is noted which may be related to operative injury versus infection. Adrenals/Urinary Tract: The kidneys and adrenal glands are unremarkable. A Foley catheter is present within the bladder. Stomach/Bowel: LEFT sided ostomy is noted. No dilated bowel loops/bowel obstruction noted. There is no evidence of pneumoperitoneum. Mild circumferential small bowel wall enhancement/possible thickening may be reactive. Vascular/Lymphatic: Aortic atherosclerosis. No enlarged abdominal or pelvic lymph nodes. Reproductive: Uterus and bilateral adnexa are unremarkable. Other: Surgical drains within the abdomen are noted. There is a small to moderate amount of fluid, primarily anteriorly and on the LEFT, which may be loculated and likely represents infected fluid. Other scattered areas of ill-defined fluid within the abdomen and pelvis noted, some adjacent to small bowel loops. No gross pneumoperitoneum is  identified. Diffuse subcutaneous edema is noted. Musculoskeletal: No acute or suspicious bony abnormalities identified. IMPRESSION: 1. Small to moderate amount of fluid within the abdomen and pelvis, primarily anteriorly and on the LEFT, which may be loculated and probably represent infected fluid. Other scattered areas of ill-defined fluid within the abdomen and pelvis, some adjacent to small bowel loops. No gross pneumoperitoneum. 2. Airspace opacities within the lingula and LEFT LOWER lobe likely represent pneumonia. 3. Moderate bilateral pleural effusions and bilateral LOWER lung atelectasis. 4. 2 cm hypodensity along the posterolateral spleen which may be related to operative injury versus infection. 5. Mild small bowel wall thickening/enhancement which may be reactive. No evidence of bowel obstruction or pneumoperitoneum. 6. Aortic Atherosclerosis (ICD10-I70.0). Electronically Signed   By: JMargarette CanadaM.D.   On: 04/14/2021 16:14     Medications:    albumin human 25 g (04/14/21 0831)   argatroban 0.45 mcg/kg/min (04/15/21 0800)   dexmedetomidine (PRECEDEX) IV infusion 0.8 mcg/kg/hr (04/15/21 0800)   fentaNYL infusion INTRAVENOUS 250 mcg/hr (04/15/21 0800)   fluconazole (DIFLUCAN) IV Stopped (04/14/21 1754)   meropenem (MERREM) IV Stopped (04/15/21 05638   TPN ADULT (ION) 70 mL/hr at 04/15/21 0800    chlorhexidine gluconate (MEDLINE KIT)  15 mL Mouth Rinse BID   Chlorhexidine Gluconate Cloth  6 each Topical Q0600   feeding supplement (PIVOT 1.5 CAL)  1,000 mL Per Tube  Q24H   free water  30 mL Per Tube Q4H   insulin aspart  0-20 Units Subcutaneous Q4H   insulin aspart  4 Units Subcutaneous Q4H   mouth rinse  15 mL Mouth Rinse 10 times per day   pantoprazole (PROTONIX) IV  40 mg Intravenous Daily   fentaNYL, midazolam, polyvinyl alcohol  Assessment/ Plan:   Ms. Amaryah Mallen is a 75 y.o. white female with hypertrophic cardiomyopathy, ventricular tachycardia status post AICD placement,  atrial fibrillation on rivaroxaban, diabetes mellitus type II, hypertension, history of diverticulitis, osteopenia, hyperlipidemia, GERD who was admitted to [facility] on 04/16/2021 for Hyponatremia [E87.1] Bowel perforation ([place]) [K63.1] Acute renal failure, unspecified acute renal failure type ([place]) [N17.9] She was found to have bowel perforation with significant pneumoperitoneum. ExLap by Dr. Celine Ahr on 7/3 .    1.  Acute kidney injury with baseline normal creatinine with Metabolic acidosis and hyponatremia.-  Anuric. No signs of renal recovery.  Transition from continuous renal replacement to intermittent hemodialysis. Orders prepared.  2. Bowel perforation with purulent peritoneum. Status post ExLap. Now with wound vac. Klebsiella pneumoniae. Appreciate surgery input  3.  Acute respiratory failure: requiring intubation and mechanical ventilation.  Appreciate critical care input.   4. Septic shock with hypotension. Required vasopressors. Off stress dose steroids Empiric meropenem and fluconazole.   5. Anemia with renal failure: status post transfusions.      LOS: 8 Tasheba Henson 7/11/20229:02 AM

## 2021-04-15 NOTE — Progress Notes (Signed)
Pharmacy Heparin Induced Thrombocytopenia (HIT) Note:  Lindsay [name] is an 75 y.o. female being evaluated for HIT. Patient has not received heparin other than 2 doses intracatheter for CRRT on 7/6 and 7/8. Platelets were 461 early 7/3 morning when patient presented and drifted down to 266 later that same day. Continued to decline since admission. Patient has been on CRRT since 7/4.  HIT labs were ordered on 7/10 when platelets dropped to 44.   CALCULATE SCORE:  4Ts (see the HIT Algorithm) Score  Thrombocytopenia 1  Timing 1  Thrombosis 1  Other causes of thrombocytopenia 0  Total 3 - low probability of HIT    Per surgeon's note, concern for DIC or HIT. With limited exposure to heparin during CRRT, question whether thrombocytopenia likely multifactorial in setting of continued CRRT and possible DIC.   Plan (Discussed with provider) Labs ordered:  HIT antibody, SRA pending Heparin allergy:  Heparin allergy documented or updated. Anticoagulation plans:  Begin alternative anticoagulation with argatroban per consult from provider  Goal aPTT: 50-90 seconds  aPTT slightly supratherapeutic, reduce argatroban rate to 0.45 mcg/kg/min Check aPTT in 4 hours after rate change Daily CBC while on argatroban Continue to follow HIT antibody, SRA   Dallie Piles, PharmD 04/15/2021, 12:02 AM

## 2021-04-15 NOTE — Consult Note (Signed)
Consultation Note Date: 04/15/2021   Patient Name: Lindsay [name]  DOB: 05-23-1946  MRN: [id number]  Age / Sex: 75 y.o., female  PCP: Rusty Aus, MD Referring Physician: Ottie Glazier, MD  Reason for Consultation: Establishing goals of care  HPI/Patient Profile: 75 year old lifelong never smoker with a history as noted below, who presented to [facility] today after a 5-day history of constant sharp and severe bilateral lower abdominal pain associated with daily episodes of nonbloody and nonbilious emesis.  The patient had not been able to take significant p.o. intake for approximately 5 days.  She endorsed to the emergency room physician that she had not made urine in 4 days PTA.  Clinical Assessment and Goals of Care: Patient is resting in bed on ventilator. Spoke with husband via phone. They have been married 32 years. They have no children.   We discussed her diagnoses, poor prognosis, GOC, EOL wishes disposition and options.  Created space and opportunity for patient  to explore thoughts and feelings regarding current medical information.   A detailed discussion was had today regarding advanced directives.  Concepts specific to code status, artifical feeding and hydration, IV antibiotics and rehospitalization were discussed.  The difference between an aggressive medical intervention path and a comfort care path was discussed.  Values and goals of care important to patient and family were attempted to be elicited.  Discussed limitations of medical interventions to prolong quality of life in some situations and discussed the concept of human mortality.  He states prior to this hospitalization, his wife was not as quick as she used to be, but she was functionally independent with no assistive devices. He states they used to live in [place], which is where her pacemaker/ICD was placed.   He states he does not  believe she would be happy with her current situation. We discussed multiple scenarios, none of which would provide a QOL she would be amenable to. He states she is a woman of [place], and he used to be, but has since came to a place of no faith. He states he wants to honor her wishes but needs to come to a place in his heart to do this.   Discussed her ICD which husband states has fired in the past. Discussed her DNR status. He will consider turning off her ICD in addition to other care moving forward.   SUMMARY OF RECOMMENDATIONS   He states he is considering care moving forward. QOL is very important to her.    Prognosis:  Poor        Primary Diagnoses: Present on Admission:  Bowel perforation (HCC)  Peritonitis ([place])  Severe sepsis (HCC)  Acute renal failure (ARF) (HCC)  Lactic acidosis  Volume depletion   I have reviewed the medical record, interviewed the patient and family, and examined the patient. The following aspects are pertinent.  Past Medical History:  Diagnosis Date   Diabetes mellitus without complication ([place])    Hypertension    Social History   Socioeconomic History  Marital status: Married    Spouse name: Not on file   Number of children: Not on file   Years of education: Not on file   Highest education level: Not on file  Occupational History   Not on file  Tobacco Use   Smoking status: Never   Smokeless tobacco: Never  Substance and Sexual Activity   Alcohol use: Never   Drug use: Never   Sexual activity: Not Currently  Other Topics Concern   Not on file  Social History Narrative   Not on file   Social Determinants of Health   Financial Resource Strain: Not on file  Food Insecurity: Not on file  Transportation Needs: Not on file  Physical Activity: Not on file  Stress: Not on file  Social Connections: Not on file   Family History  Problem Relation Age of Onset   Breast cancer Mother    Scheduled Meds:  chlorhexidine  gluconate (MEDLINE KIT)  15 mL Mouth Rinse BID   Chlorhexidine Gluconate Cloth  6 each Topical Q0600   feeding supplement (PIVOT 1.5 CAL)  1,000 mL Per Tube Q24H   free water  30 mL Per Tube Q4H   insulin aspart  0-20 Units Subcutaneous Q4H   insulin aspart  4 Units Subcutaneous Q4H   mouth rinse  15 mL Mouth Rinse 10 times per day   pantoprazole (PROTONIX) IV  40 mg Intravenous Daily   Continuous Infusions:  argatroban 0.45 mcg/kg/min (04/15/21 0800)   dexmedetomidine (PRECEDEX) IV infusion 0.8 mcg/kg/hr (04/15/21 0913)   fentaNYL infusion INTRAVENOUS 300 mcg/hr (04/15/21 1019)   fluconazole (DIFLUCAN) IV     meropenem (MERREM) IV     TPN ADULT (ION) 70 mL/hr at 04/15/21 0800   TPN ADULT (ION)     PRN Meds:.fentaNYL, midazolam, polyvinyl alcohol Medications Prior to Admission:  Prior to Admission medications   Medication Sig Start Date End Date Taking? Authorizing Provider  acetaminophen (TYLENOL) 500 MG tablet Take 500 mg by mouth every 6 (six) hours as needed.   Yes [provider]  Alum Hydroxide-Mag Carbonate (GAVISCON PO) Take 5-10 mLs by mouth 3 (three) times daily as needed. Plus or minus 3 hours from metoprolol succinate er   Yes [provider]  Cyanocobalamin (VITAMIN B12 PO) Take 1,000 mcg by mouth daily.   Yes [provider]  esomeprazole (NEXIUM) 20 MG capsule Take 20 mg by mouth daily at 12 noon.   Yes [provider]  losartan (COZAAR) 50 MG tablet Take 50 mg by mouth 2 (two) times daily. 04/30/20  Yes [provider]  metoprolol succinate (TOPROL-XL) 100 MG 24 hr tablet Take 100 mg by mouth 2 (two) times daily. 05/27/20  Yes [provider]  Multiple Vitamins tablet Take 2 tablets by mouth daily. 01/26/20  Yes [provider]  rosuvastatin (CRESTOR) 10 MG tablet Take 10 mg by mouth at bedtime. 04/28/20  Yes [provider]  XARELTO 20 MG TABS tablet Take 20 mg by mouth daily. 04/28/20  Yes [provider]  amiodarone (PACERONE) 200 MG tablet Take by mouth. Patient not taking: No sig reported 05/27/20   [provider]  amoxicillin-clavulanate (AUGMENTIN) 875-125 MG tablet Take 1 tablet by mouth 2 (two) times daily. Patient not taking: No sig reported    [provider]  aspirin 81 MG EC tablet Aspir-81 mg tablet,delayed release  Take 1 tablet every day by oral route. Patient not taking: No sig reported    [provider]  beta carotene w/minerals (OCUVITE) tablet Take 1 tablet by mouth daily. Patient not taking: No sig reported 01/26/20   [provider]  calcium citrate-vitamin D (CITRACAL+D) 315-200 MG-UNIT tablet Take by mouth. Patient not taking: No sig reported    [provider]  ketoconazole (NIZORAL) 2 % shampoo Shampoo into the scalp let sit 5-10 minutes then wash out. Use 3d/wk. Patient not taking: Reported on 04/11/2021 02/25/21   Ralene Bathe, MD  MAGNESIUM OXIDE PO Take 165 mg by mouth daily. Patient not taking: Reported on 05/04/2021    [provider]  mupirocin ointment (BACTROBAN) 2 % Apply to ulceration QD until healed. Patient not taking: Reported on 04/19/2021 02/25/21   Ralene Bathe, MD   Allergies  Allergen Reactions   Amiodarone     Other reaction(s): Other (See Comments) Nausea, diarrhea, weight loss, anxiety, jitteriness   Levofloxacin Rash    Red palms, lips, forearms    Meloxicam Shortness Of Breath    Other reaction(s): Respiratory Distress   Amlodipine     Other reaction(s): Other (See Comments) Caused severe gingival hyperplasia   Amlodipine Besy-Benazepril Hcl     Severe gingival hyperplasia  Other reaction(s): Other (See Comments) Other reaction(s): Other   Beta Adrenergic Blockers     States severe fatigue  Other reaction(s): Other (See Comments) fatigue   Heparin     HIT workup pending   Review of Systems  Unable to perform ROS  Physical Exam Constitutional:       Comments: On ventilator.     Vital Signs: BP (!) 128/59   Pulse 70   Temp (!) 92.5 F (33.6 C)   Resp 20   Ht _0  (1.651 m)   Wt 81.7 kg   SpO2 94%   BMI 29.97 kg/m  Pain Scale: CPOT   Pain Score: 0-No pain   SpO2: SpO2: 94 % O2 Device:SpO2: 94 % O2 Flow Rate: .   IO: Intake/output summary:  Intake/Output Summary (Last 24 hours) at 04/15/2021 1042 Last data filed at 04/15/2021 0800 Gross per 24 hour  Intake 4296.3 ml  Output 5056.8 ml  Net -760.5 ml    LBM: Last BM Date: 04/15/21 (stool in ostomy bag) Baseline Weight: Weight: 63.5 kg Most recent weight: Weight: 81.7 kg       Time In: 10:00 Time Out: 11:10 Time Total: 70 min Greater than 50%  of this time was spent counseling and coordinating care related to the above assessment and plan.  Signed by: Asencion Gowda, NP   Please contact Palliative Medicine Team phone at [phone] for questions and concerns.  For individual provider: See Shea Evans

## 2021-04-15 NOTE — Progress Notes (Signed)
ID Remains intubated- so no review of system available Off pressors CRRT stopped BP (!) 78/44   Pulse 96   Temp (!) 101.1 F (38.4 C)   Resp (!) 31   Ht 5\' 5"  (1.651 m)   Wt 81.7 kg   SpO2 (!) 89%   BMI 29.97 kg/m   Chest b/l air entry Hs tachycardia Rt IJ, LEFT IJ catheter Left radial Foley Abd 2 JP drain' wound vac covering the surgical incision colostomy CNs cannot be assessed  Labs CBC Latest Ref Rng & Units 04/15/2021 04/15/2021 04/14/2021  WBC 4.0 - 10.5 K/uL 18.9(H) 14.4(H) -  Hemoglobin 12.0 - 15.0 g/dL 8.8(L) 8.3(L) 7.8(L)  Hematocrit 36.0 - 46.0 % 26.1(L) 24.0(L) 23.4(L)  Platelets 150 - 400 K/uL 52(L) 41(L) -    CMP Latest Ref Rng & Units 04/15/2021 04/15/2021 04/15/2021  Glucose 70 - 99 mg/dL 175(H) 185(H) 184(H)  BUN 8 - 23 mg/dL 39(H) 32(H) 31(H)  Creatinine 0.44 - 1.00 mg/dL 0.65 0.54 0.47  Sodium 135 - 145 mmol/L 135 136 135  Potassium 3.5 - 5.1 mmol/L 4.0 4.0 4.0  Chloride 98 - 111 mmol/L 104 105 105  CO2 22 - 32 mmol/L 25 27 27   Calcium 8.9 - 10.3 mg/dL 8.2(L) 8.1(L) 8.0(L)  Total Protein 6.5 - 8.1 g/dL 5.0(L) 4.9(L) -  Total Bilirubin 0.3 - 1.2 mg/dL 0.8 0.9 -  Alkaline Phos 38 - 126 U/L 105 99 -  AST 15 - 41 U/L 22 20 -  ALT 0 - 44 U/L 17 16 -    Micro 04/10/21 - fluid is klebsiella Imaging- Ct abdomen/pelvis Small to moderate amount of fluid within the abdomen and pelvis, primarily anteriorly and on the LEFT, which may be loculated and probably represent infected fluid. Other scattered areas of ill-defined fluid within the abdomen and pelvis, some adjacent to small bowel loops     Impression/recommendation  Perforated diverticulitis with intra-abdominal abscesses and pneumoperitoneum.  Underwent exploratory laparotomy with Hartman's procedure and colostomy. Had multiorgan failure which is improving now.  She is off pressors.  Acute hypoxic respiratory failure remains intubated.  Leukocytosis is improved. Repeat CT done yesterday showed  left-sided collection.  IR has placed a drain and send some cultures today.  Anemia  She is currently on meropenem and fluconazole  AKI due to septic shock.  Was on CRRT.  Now on intermittent dialysis.  Discussed the management with her  nurse

## 2021-04-15 NOTE — Progress Notes (Addendum)
Request to IR for intra-abdominal fluid collection aspiration/drain placement - called patient's husband Skylynne Schlechter today to discuss procedure/obtain consent.  At this time patient expresses concerns about putting his wife through another procedure and is considering possible transition to comfort care, although he would like to discuss this further with the primary team today. He asks that we hold off on drain placement for today until he can get some more information on his wife's overall prognosis.  No procedure planned for today per patient's husband's wishes. IR will follow up tomorrow AM.  Please call with questions or concerns.  Candiss Norse, PA-C  ADDENDUM [phone] - after discussion with primary team via phone patient's husband has decided to proceed with drain placement, verbal consent obtained, please see separate consult note for full details.

## 2021-04-15 NOTE — Progress Notes (Signed)
PHARMACY - TOTAL PARENTERAL NUTRITION CONSULT NOTE   Indication:  extensive bowel surgery, poor PO intake PTA  Patient Measurements: Height: 5\' 5"  (165.1 cm) Weight: 81.7 kg (180 lb 1.9 oz) IBW/kg (Calculated) : 57 TPN AdjBW (KG): 63.5 Body mass index is 29.97 kg/m.  Assessment: 75 year old female with minimal PO intake 5 days prior to admission. Imaging showed bowel perforation; patient was taken to the OR for exploratory laparotomy with low anterior resection and colostomy placement. Patient is intubated and sedated in the ICU. Required vasopressors, which have been titrated down. Episode of atrial fibrillation with RVR 7/4, started on amiodarone infusion. Patient has h/o afib s/p ablation on Xarelto; unable to anticoagulate at this time s/t recent surgery. AKI with decreased UOP prior to hospitalization; started on CRRT 7/4.  Glucose / Insulin: on sensitive SSI + Novolog 4u q4h 24 hr glucose range 167-194 24 hr insulin requirements 54u Electrolytes: within normal limits Renal: CRRT 7/4 - 7/11, transitioning to HD 7/11 Intake / Output: net (+) 14 L GI Imaging: 7/3 CT abdomen/pelvis: Bowel perforation with marked inflammatory changes and pneumoperitoneum throughout the abdomen and pelvis. Bowel ischemia not excluded 7/10 CT abdomen/pelvis: Small to moderate amount of fluid within the abdomen and pelvis, primarily anteriorly and on the LEFT, which may be loculated and probably represent infected fluid. Other scattered areas of ill-defined fluid within the abdomen and pelvis, some adjacent to small bowel loops. No gross pneumoperitoneum. 2 cm hypodensity along the posterolateral spleen which may be related to operative injury versus infection. No evidence of bowel obstruction or pneumoperitoneum. GI Surgeries / Procedures:  7/3 ex lap with low anterior resection and colostomy placement  Central access: CVC right IJ TPN start date: 7/4  Nutritional Goals (per RD recommendation on  7/7): kCal: 1455 kcal/day, Protein: 110-125 g, Fluid: 1.4-1.7 L/day Goal TPN rate is 70 mL/hr (provides 117 g of protein and 1434 kcals per day)  Propofol stopped 7/8 Pivot 1.5 cal at 30 mL/hr (goal 40 mL/hr)  Current Nutrition: NPO  Plan:  --Decrease TPN to 1/2 rate of 35 mL/hr. Nutritional adjustments made per updated RD recommendations Contains 58.8 g protein, 92.4 g dextrose, 16.8 g lipids. Total Kcal 717. Total volume 840 mL --Electrolytes in TPN: Na 50 mEq/L, K 5 mEq/L, Ca 5 mEq/L, Mg 5 mEq/L, and Phos 15 mmol/L. Cl:Ac 1:1 Patient started on CRRT 7/4, transitioning to HD 7/11. Likely decrease phosphorous content in TPN tomorrow if continued Potassium in TPN minimal to maintain Cl:Ac ratio --Add standard MVI and trace elements to TPN. Remove chromium for decreased renal function. --Increase Novolog to 4 units q4h and continue resistant SSI q4h. Monitor for further adjustments. --Monitor TPN labs on Mon/Thurs at minimum  Benita Gutter 04/15/2021,12:16 PM

## 2021-04-15 NOTE — Progress Notes (Signed)
NAME:  Lindsay [name], MRN:  [id number], DOB:  1946-04-15, LOS: 8 ADMISSION DATE:  04/10/2021    Chief complaint:  Septic Shock   Abbreviated history:   75 yo F admitted with abdominal pain and sepsis with perforated viscus.  She was taken to OR s/o GI surgery with resection and ostomy removal of 1.5L of pus.  Drainage grew resistant Klebsiella.   Patient admitted to [facility] for septic shock due to intraabdominal infection.      Events:  05/01/2021 admitted post emergent ex lap for perforated viscus, peritonitis, severe sepsis 05/03/2021 piperacillin tazobactam and Eraxis initiated 7/4 remains critically ill, severe multiorgan failure 7/5 remains critically ill, severe shock, on CRRT 7/6 remains on vent, pressors and CRRT 7/7 remains on CRRT, on pressors, on vent 04/12/21- patient is critically ill on levophed, TPN, mechanical ventilation, dialysis, abdominal surgical wound with 2 JP drains actively draining, midline surgical wound ,  ostomy with active stooling, acrocyanosis, comatose in [place].  Plan to start PO feeds.  04/13/21- patient improved slighlty and weaned off all vasopressor.  Met with husband and reviwed care plan.   04/14/21 - patient has mild neurologic recovery with improved GCS score.  Plan to transfuse blood today and repeat abdominal imaging.  04/15/21- patient has left abd fluid collection. I spoke with patient regarding goals of care.  He wants procedure done and to continue medical management for now.  Reviewed procedure with IR - radiologist thinks it will be helpful to remove infected fluid from abdominal cavity.   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 Fib Diverticulitis     Interim History / Subjective:  Severe shock On CRRT On pressors +multiorgan failure   Antimicrobials:   Antibiotics Given (last 72 hours)     Date/Time Action Medication Dose Rate   04/12/21 1404 New Bag/Given   piperacillin-tazobactam  (ZOSYN) IVPB 3.375 g 3.375 g 100 mL/hr   04/12/21 1808 New Bag/Given   meropenem (MERREM) 1 g in sodium chloride 0.9 % 100 mL IVPB 1 g 200 mL/hr   04/12/21 2209 New Bag/Given   meropenem (MERREM) 1 g in sodium chloride 0.9 % 100 mL IVPB 1 g 200 mL/hr   04/13/21 0539 New Bag/Given   meropenem (MERREM) 1 g in sodium chloride 0.9 % 100 mL IVPB 1 g 200 mL/hr   04/13/21 1308 New Bag/Given   meropenem (MERREM) 1 g in sodium chloride 0.9 % 100 mL IVPB 1 g 200 mL/hr   04/13/21 2125 New Bag/Given   meropenem (MERREM) 1 g in sodium chloride 0.9 % 100 mL IVPB 1 g 200 mL/hr   04/14/21 0529 New Bag/Given   meropenem (MERREM) 1 g in sodium chloride 0.9 % 100 mL IVPB 1 g 200 mL/hr   04/14/21 1309 New Bag/Given   meropenem (MERREM) 1 g in sodium chloride 0.9 % 100 mL IVPB 1 g 200 mL/hr   04/14/21 2210 New Bag/Given   meropenem (MERREM) 1 g in sodium chloride 0.9 % 100 mL IVPB 1 g 200 mL/hr   04/15/21 0604 New Bag/Given   meropenem (MERREM) 1 g in sodium chloride 0.9 % 100 mL IVPB 1 g 200 mL/hr        Objective   Blood pressure (!) 128/59, pulse 70, temperature (!) 92.5 F (33.6 C), resp. rate 20, height _0  (1.651 m), weight 81.7 kg, SpO2 94 %. CVP:  [6 mmHg-86 mmHg] 13 mmHg  Vent  Mode: PRVC FiO2 (%):  [24 %] 24 % Set Rate:  [20 bmp] 20 bmp Vt Set:  [450 mL] 450 mL PEEP:  [5 cmH20] 5 cmH20 Plateau Pressure:  [16 cmH20-24 cmH20] 16 cmH20   Intake/Output Summary (Last 24 hours) at 04/15/2021 0826 Last data filed at 04/15/2021 0800 Gross per 24 hour  Intake 4653.02 ml  Output 5437.7 ml  Net -784.68 ml    Filed Weights   04/12/21 0318 04/13/21 0500 04/14/21 0417  Weight: 79.1 kg 81.7 kg 81.7 kg      REVIEW OF SYSTEMS  PATIENT IS UNABLE TO PROVIDE COMPLETE REVIEW OF SYSTEMS DUE TO SEVERE CRITICAL ILLNESS AND TOXIC METABOLIC ENCEPHALOPATHY on mechanical ventilation with sedation    PHYSICAL EXAMINATION:  GENERAL:critically ill appearing, +resp distress HEAD: Normocephalic,  atraumatic.  EYES: Pupils equal, round, reactive to light.  No scleral icterus.  MOUTH: Moist mucosal membrane. NECK: Supple. +ETT PULMONARY: +rhonchi, CARDIOVASCULAR: S1 and S2. Regular rate and rhythm. No murmurs, rubs, or gallops.  GASTROINTESTINAL:distended +drains MUSCULOSKELETAL: No swelling, clubbing, or edema.  NEUROLOGIC: GCS4T SKIN:intact,warm,dry   Labs/imaging that I havepersonally reviewed  (right click and "Reselect all SmartList Selections" daily)       ASSESSMENT AND PLAN SYNOPSIS   75 yo female with severe bowel perforation SEPSIS PRESENT ON ADMISSION and perforated ABD with ABD abscess with severe septic shock s/p ex lap with progressive multiorgan failure    SEPTIC SHOCK           Present on admission - due to intraabdominal infection with resistant Klebsiella and large area of infection with perforated viscus s/p surgery with removal of large amount of pus SOURCE-ABD bowel perf -off vasopressors now -plan to repeat CT imaging of abdomen/pelvis today  -ID on case - patient on Merem and diflucan - appreciate input    Severe ACUTE Hypoxic and Hypercapnic Respiratory Failure -continue Mechanical Ventilator support -continue Bronchodilator Therapy -Wean Fio2 and PEEP as tolerated -VAP/VENT bundle implementation  Vent Mode: PRVC FiO2 (%):  [24 %] 24 % Set Rate:  [20 bmp] 20 bmp Vt Set:  [450 mL] 450 mL PEEP:  [5 cmH20] 5 cmH20 Plateau Pressure:  [16 cmH20-24 cmH20] 16 cmH20 -perform SBT if patient is appropriate    CARDIAC FAILURE-acute MAT, Afib -oxygen as needed -Lasix as tolerated follow up cardiac enzymes as indicated -stopped AMIOdarone today - patient is sinus rhythm with occasional PVCs and PACs - will monitor electorlytes and replete as needed     CARDIAC ICU monitoring   ACUTE KIDNEY INJURY/Renal Failure -continue Foley Catheter-assess need -Avoid nephrotoxic agents -Follow urine output, BMP -Ensure adequate renal perfusion,  optimize oxygenation -Renal dose medications On CRRT   Intake/Output Summary (Last 24 hours) at 04/15/2021 0826 Last data filed at 04/15/2021 0800 Gross per 24 hour  Intake 4653.02 ml  Output 5437.7 ml  Net -784.68 ml      NEUROLOGY Acute toxic metabolic encephalopathy, need for sedation Goal RASS -2 to -3   ENDO - ICU hypoglycemic\Hyperglycemia protocol -check FSBS per protocol   GI GI PROPHYLAXIS as indicated  NUTRITIONAL STATUS DIET-->TPN Constipation protocol as indicated   ELECTROLYTES -follow labs as needed -replace as needed -pharmacy consultation and following   Best practice (right click and "Reselect all SmartList Selections" daily)  Diet:  NPO Pain/Anxiety/Delirium protocol (if indicated): Yes (RASS goal -2) VAP protocol (if indicated): Yes DVT prophylaxis: Subcutaneous Heparin GI prophylaxis: PPI Glucose control:  SSI Yes Central venous access:  Yes, and it is still needed Arterial  line:  Yes, and it is still needed Foley:  Yes, and it is still needed Mobility:  bed rest  PT consulted: N/A Code Status:  DNR Disposition: ICU   Labs   CBC: Recent Labs  Lab 04/09/21 0521 04/10/21 0312 04/11/21 0404 04/12/21 0403 04/13/21 0432 04/14/21 0450 04/14/21 1344 04/15/21 0454  WBC 19.3* 21.8* 38.9* 46.8* 33.4* 23.4*  --  14.4*  NEUTROABS 18.5* 20.4*  --  42.6*  --   --   --  13.1*  HGB 8.3* 8.0* 10.1* 9.2* 7.6* 6.8* 7.8* 8.3*  HCT 24.2* 23.8* 31.2* 28.5* 23.4* 20.3* 23.4* 24.0*  MCV 82.9 83.2 86.9 86.4 86.7 84.2  --  83.6  PLT 131* 120* 97* 72* 59* 44*  --  41*     Basic Metabolic Panel: Recent Labs  Lab 04/11/21 0404 04/11/21 1616 04/12/21 0403 04/12/21 1704 04/13/21 0432 04/13/21 1640 04/14/21 0450 04/14/21 1344 04/15/21 0454  NA 134*  135   < > 133*   < > 130* 135 133* 134* 136  135  K 3.9  3.9   < > 4.1   < > 4.6 4.0 4.2 4.0 4.0  4.0  CL 101  101   < > 102   < > 104 104 104 103 105  105  CO2 29  28   < > 26   < > _0 GLUCOSE 211*  210*   < > 276*   < > 204* 209* 199* 194* 185*  184*  BUN 17  17   < > 15   < > 21 22 27* 28* 32*  31*  CREATININE 0.78  0.78   < > 0.68   < > 0.83 0.63 0.59 0.54 0.54  0.47  CALCIUM 7.4*  7.3*   < > 7.2*   < > 7.5* 7.5* 7.8* 8.1* 8.1*  8.0*  MG 2.3  --  2.3  --  2.3  --  2.4  --  2.4  PHOS 2.2*  2.2*   < > 2.9   < > 2.9  2.9 1.8* 2.2*  2.3* 2.4* 2.7  2.7   < > = values in this interval not displayed.    GFR: Estimated Creatinine Clearance: 64.2 mL/min (by C-G formula based on SCr of 0.54 mg/dL). Recent Labs  Lab 04/09/21 0521 04/09/21 1124 04/10/21 0312 04/12/21 0403 04/13/21 0432 04/14/21 0450 04/15/21 0454  PROCALCITON 11.60  --   --   --   --   --   --   WBC 19.3*  --    < > 46.8* 33.4* 23.4* 14.4*  LATICACIDVEN  --  1.3  --   --   --   --   --    < > = values in this interval not displayed.     Liver Function Tests: Recent Labs  Lab 04/09/21 0521 04/09/21 1601 04/11/21 0404 04/11/21 1616 04/12/21 0536 04/12/21 1704 04/13/21 0432 04/13/21 1640 04/14/21 0450 04/14/21 1344 04/15/21 0454  AST 38  --  16  --  14*  --   --   --   --   --  20  ALT 31  --  19  --  14  --   --   --   --   --  16  ALKPHOS 44  --  63  --  69  --   --   --   --   --  99  BILITOT 1.3*  --  0.7  --  0.7  --   --   --   --   --  0.9  PROT 4.3*  --  4.5*  --  4.2*  --   --   --   --   --  4.9*  ALBUMIN 2.0*  2.0*   < > 1.7*  1.8*   < > 1.7*   < > 1.7* 2.1* 2.1* 2.5* 2.3*  2.3*   < > = values in this interval not displayed.    No results for input(s): LIPASE, AMYLASE in the last 168 hours.  No results for input(s): AMMONIA in the last 168 hours.  ABG    Component Value Date/Time   PHART 7.38 04/09/2021 0341   PCO2ART 40 04/09/2021 0341   PO2ART 115 (H) 04/09/2021 0341   HCO3 23.7 04/09/2021 0341   ACIDBASEDEF 1.3 04/09/2021 0341   O2SAT 98.4 04/09/2021 0341      Coagulation Profile: Recent Labs  Lab 04/12/21 0536 04/14/21 0450  04/14/21 1344  INR 1.4* 1.4* 1.3*     Cardiac Enzymes: No results for input(s): CKTOTAL, CKMB, CKMBINDEX, TROPONINI in the last 168 hours.  HbA1C: Hgb A1c MFr Bld  Date/Time Value Ref Range Status  05/04/2021 04:51 PM 5.7 (H) 4.8 - 5.6 % Final    Comment:    (NOTE)         Prediabetes: 5.7 - 6.4         Diabetes: >6.4         Glycemic control for adults with diabetes: <7.0     CBG: Recent Labs  Lab 04/14/21 1909 04/14/21 1952 04/14/21 2352 04/15/21 0405 04/15/21 0735  GLUCAP 176* 170* 194* 173* 167*     Allergies Allergies  Allergen Reactions   Amiodarone     Other reaction(s): Other (See Comments) Nausea, diarrhea, weight loss, anxiety, jitteriness   Levofloxacin Rash    Red palms, lips, forearms    Meloxicam Shortness Of Breath    Other reaction(s): Respiratory Distress   Amlodipine     Other reaction(s): Other (See Comments) Caused severe gingival hyperplasia   Amlodipine Besy-Benazepril Hcl     Severe gingival hyperplasia  Other reaction(s): Other (See Comments) Other reaction(s): Other   Beta Adrenergic Blockers     States severe fatigue  Other reaction(s): Other (See Comments) fatigue   Heparin     HIT workup pending        DVT/GI PRX  assessed I Assessed the need for Labs I Assessed the need for Foley I Assessed the need for Central Venous Line Family Discussion when available I Assessed the need for Mobilization I made an Assessment of medications to be adjusted accordingly Safety Risk assessment completed  CASE DISCUSSED IN MULTIDISCIPLINARY ROUNDS WITH ICU TEAM     Critical Care Time devoted to patient care services described in this note is 33 minutes.  Critical care was necessary to treat or prevent imminent or life-threatening deterioration.   PATIENT WITH VERY POOR PROGNOSIS I ANTICIPATE PROLONGED ICU LOS  Patient with Multiorgan failure and at high risk for cardiac arrest and death.     Ottie Glazier, M.D.   [facility] [place]

## 2021-04-15 NOTE — Procedures (Signed)
Interventional Radiology Procedure Note  Procedure: Placement of 58F drain into left abdominal fluid collection.  Mildly turbid serosanguinous ascites aspirated.  Tube left to JP drain.   Complications: None  Estimated Blood Loss: None  Recommendations: - Fluid sent for cx - Tube to JP   Signed,  Criselda Peaches, MD

## 2021-04-15 NOTE — Progress Notes (Addendum)
Pt responds to pain. BP fluctuating too high or too low, MD aware, prn pain and sedation medications administered. Labetalol not effective for long. Standing orders for hypoglycemia administered and effective. Amiodarone continuous and bolus administered for a-fib with 140-150's rate post IR trip. No WUA not performed due to IR trip and pt instability with blood pressures and agitation.

## 2021-04-15 NOTE — Progress Notes (Signed)
Pt turned q2 hr this shift, tolerating tube feeds at 30 ml and hour, colostomy producing dark brown liquid stool. Anuric currently, little to no drainage from JP drains, amount is too small to accurately measure, under 25 ml's. No drainage from wound vac. She did not have any obvious panic episodes, but her blood pressure is difficult to control. 2-4 mg of versed has kept it under 180 according to art line. Notified NP of disparity between art line and cuff pressure, was instructed to use cuff pressure and that if pressure stays over 180, to notify NP and she will order Labetalol. No issues with CRRT, will continue to monitor.

## 2021-04-15 NOTE — Progress Notes (Signed)
Pharmacy Antibiotic Note  Lindsay [name] is a 75 y.o. female w/ PMH of HTN, DM, CHF, atrial fibrillation, diverticular disease, sigmoid stricture admitted on 04/30/2021 post emergent ex lap for perforated viscus, peritonitis, severe sepsis. Pharmacy has been consulted for meropenem and fluconazole dosing (previously on anidulafungin).  Today, 04/15/2021 Day # 9 antibacterial and antifungal coverage WBC 14.4 (trending down) No fevers but rather HYPOthermic Renal - transitioning off CRRT >> HD No OR culture 7/6 JP drain = K pneumoniae (Amp R) 7/3 Blood cx: NG-F Remains VDRF and off pressors ID consulted 7/7  Plan: Adjust fluconazole 200 mg IV q24h Monitor LFTs / DDIs / avoid QT prolonging agents Adjust meropenem to 500 mg IV q24h  Height: 5\' 5"  (165.1 cm) Weight: 81.7 kg (180 lb 1.9 oz) IBW/kg (Calculated) : 57  Temp (24hrs), Avg:92.6 F (33.7 C), Min:92.3 F (33.5 C), Max:93.92 F (34.4 C)  Recent Labs  Lab 04/09/21 1124 04/09/21 1601 04/11/21 0404 04/11/21 1616 04/12/21 0403 04/12/21 1704 04/13/21 0432 04/13/21 1640 04/14/21 0450 04/14/21 1344 04/15/21 0454  WBC  --    < > 38.9*  --  46.8*  --  33.4*  --  23.4*  --  14.4*  CREATININE  --    < > 0.78  0.78   < > 0.68   < > 0.83 0.63 0.59 0.54 0.54  0.47  LATICACIDVEN 1.3  --   --   --   --   --   --   --   --   --   --    < > = values in this interval not displayed.     Estimated Creatinine Clearance: 64.2 mL/min (by C-G formula based on SCr of 0.54 mg/dL).    Allergies  Allergen Reactions   Amiodarone     Other reaction(s): Other (See Comments) Nausea, diarrhea, weight loss, anxiety, jitteriness   Levofloxacin Rash    Red palms, lips, forearms    Meloxicam Shortness Of Breath    Other reaction(s): Respiratory Distress   Amlodipine     Other reaction(s): Other (See Comments) Caused severe gingival hyperplasia   Amlodipine Besy-Benazepril Hcl     Severe gingival hyperplasia  Other reaction(s): Other (See  Comments) Other reaction(s): Other   Beta Adrenergic Blockers     States severe fatigue  Other reaction(s): Other (See Comments) fatigue   Heparin     HIT workup pending    Antimicrobials this admission: Anidulafungin 7/3 >> 7/7 Fluconazole 7/8 >> Zosyn 7/3 >> 7/8 Meropenem 7/8 >>  Microbiology results: 07/06 JP drain: K pneumo (Amp R) 07/03 BCx: NG - final 07/03 MRSA PCR: negative 07/03 SARS CoV-2: negative 07/03 influenza A/B: negative  Thank you for allowing pharmacy to be a part of this patient's care.  Benita Gutter  04/15/2021 9:18 AM

## 2021-04-16 ENCOUNTER — Inpatient Hospital Stay: Payer: Medicare Other

## 2021-04-16 LAB — PHOSPHORUS: Phosphorus: 4.4 mg/dL (ref 2.5–4.6)

## 2021-04-16 LAB — CBC
HCT: 26.4 % — ABNORMAL LOW (ref 36.0–46.0)
Hemoglobin: 8.7 g/dL — ABNORMAL LOW (ref 12.0–15.0)
MCH: 28.7 pg (ref 26.0–34.0)
MCHC: 33 g/dL (ref 30.0–36.0)
MCV: 87.1 fL (ref 80.0–100.0)
Platelets: 78 10*3/uL — ABNORMAL LOW (ref 150–400)
RBC: 3.03 MIL/uL — ABNORMAL LOW (ref 3.87–5.11)
RDW: 21.9 % — ABNORMAL HIGH (ref 11.5–15.5)
WBC: 22.7 10*3/uL — ABNORMAL HIGH (ref 4.0–10.5)
nRBC: 0.3 % — ABNORMAL HIGH (ref 0.0–0.2)

## 2021-04-16 LAB — BASIC METABOLIC PANEL
Anion gap: 8 (ref 5–15)
BUN: 76 mg/dL — ABNORMAL HIGH (ref 8–23)
CO2: 23 mmol/L (ref 22–32)
Calcium: 8.1 mg/dL — ABNORMAL LOW (ref 8.9–10.3)
Chloride: 105 mmol/L (ref 98–111)
Creatinine, Ser: 1.54 mg/dL — ABNORMAL HIGH (ref 0.44–1.00)
GFR, Estimated: 35 mL/min — ABNORMAL LOW (ref >60)
Glucose, Bld: 123 mg/dL — ABNORMAL HIGH (ref 70–99)
Potassium: 4.8 mmol/L (ref 3.5–5.1)
Sodium: 136 mmol/L (ref 135–145)

## 2021-04-16 LAB — LACTIC ACID, PLASMA: Lactic Acid, Venous: 1.1 mmol/L (ref 0.5–1.9)

## 2021-04-16 LAB — APTT: aPTT: 88 seconds — ABNORMAL HIGH (ref 24–36)

## 2021-04-16 LAB — GLUCOSE, CAPILLARY
Glucose-Capillary: 138 mg/dL — ABNORMAL HIGH (ref 70–99)
Glucose-Capillary: 134 mg/dL — ABNORMAL HIGH (ref 70–99)
Glucose-Capillary: 61 mg/dL — ABNORMAL LOW (ref 70–99)
Glucose-Capillary: 115 mg/dL — ABNORMAL HIGH (ref 70–99)
Glucose-Capillary: 96 mg/dL (ref 70–99)
Glucose-Capillary: 136 mg/dL — ABNORMAL HIGH (ref 70–99)
Glucose-Capillary: 138 mg/dL — ABNORMAL HIGH (ref 70–99)

## 2021-04-16 LAB — MAGNESIUM: Magnesium: 2.5 mg/dL — ABNORMAL HIGH (ref 1.7–2.4)

## 2021-04-16 LAB — PROCALCITONIN: Procalcitonin: 2.6 ng/mL

## 2021-04-16 LAB — PROTIME-INR
INR: 4.1 (ref 0.8–1.2)
Prothrombin Time: 39.8 seconds — ABNORMAL HIGH (ref 11.4–15.2)

## 2021-04-16 LAB — HEPARIN INDUCED PLATELET AB (HIT ANTIBODY): Heparin Induced Plt Ab: 0.935 OD — ABNORMAL HIGH (ref 0.000–0.400)

## 2021-04-16 MED ORDER — INSULIN ASPART 100 UNIT/ML IJ SOLN
0.0000 [IU] | INTRAMUSCULAR | Status: DC
  Administered 2021-04-17 (×3): 1 [IU] via SUBCUTANEOUS
  Administered 2021-04-17: 2 [IU] via SUBCUTANEOUS
  Administered 2021-04-17: 1 [IU] via SUBCUTANEOUS
  Administered 2021-04-17: 2 [IU] via SUBCUTANEOUS
  Administered 2021-04-17 – 2021-04-20 (×2): 1 [IU] via SUBCUTANEOUS
  Filled 2021-04-16 (×8): qty 1

## 2021-04-16 MED ORDER — ALBUMIN HUMAN 25 % IV SOLN
12.5000 g | Freq: Once | INTRAVENOUS | Status: AC
  Administered 2021-04-16: 12.5 g via INTRAVENOUS
  Filled 2021-04-16: qty 50

## 2021-04-16 MED ORDER — ARGATROBAN 50 MG/50ML IV SOLN
0.4000 ug/kg/min | INTRAVENOUS | Status: DC
  Administered 2021-04-16: 0.45 ug/kg/min via INTRAVENOUS
  Administered 2021-04-17: 0.4 ug/kg/min via INTRAVENOUS
  Filled 2021-04-16: qty 50

## 2021-04-16 MED ORDER — DEXTROSE 50 % IV SOLN
25.0000 mL | Freq: Once | INTRAVENOUS | Status: AC
  Administered 2021-04-16: 25 mL via INTRAVENOUS
  Filled 2021-04-16: qty 50

## 2021-04-16 MED ORDER — HEPARIN SODIUM (PORCINE) 1000 UNIT/ML IJ SOLN
INTRAMUSCULAR | Status: AC
  Filled 2021-04-16: qty 1

## 2021-04-16 MED ORDER — INSULIN ASPART 100 UNIT/ML IJ SOLN: 2.0000 [IU] | INTRAMUSCULAR | Status: DC

## 2021-04-16 NOTE — Progress Notes (Signed)
NAME:  Lindsay [name], MRN:  [id number], DOB:  [birth year], LOS: 9 ADMISSION DATE:  04/28/2021    Chief complaint:  Septic Shock   Abbreviated history:   75 yo F admitted with abdominal pain and sepsis with perforated viscus.  She was taken to OR s/o GI surgery with resection and ostomy removal of 1.5L of pus.  Drainage grew resistant Klebsiella.   Patient admitted to [facility] for septic shock due to intraabdominal infection.      Events:  04/05/2021 admitted post emergent ex lap for perforated viscus, peritonitis, severe sepsis 04/19/2021 piperacillin tazobactam and Eraxis initiated 7/4 remains critically ill, severe multiorgan failure 7/5 remains critically ill, severe shock, on CRRT 7/6 remains on vent, pressors and CRRT 7/7 remains on CRRT, on pressors, on vent 04/12/21- patient is critically ill on levophed, TPN, mechanical ventilation, dialysis, abdominal surgical wound with 2 JP drains actively draining, midline surgical wound ,  ostomy with active stooling, acrocyanosis, comatose in [place].  Plan to start PO feeds.  04/13/21- patient improved slighlty and weaned off all vasopressor.  Met with husband and reviwed care plan.   04/14/21 - patient has mild neurologic recovery with improved GCS score.  Plan to transfuse blood today and repeat abdominal imaging.  04/15/21- patient has left abd fluid collection. I spoke with patient regarding goals of care.  He wants procedure done and to continue medical management for now.  Reviewed procedure with IR - radiologist thinks it will be helpful to remove infected fluid from abdominal cavity.   She is off all vasorpressors  04/16/21- patient had full HD today removed 1.8 L she did well.  I met with husband and reviewed care plan.  Patient remains severely critically ill.  He is appreciative of care and thankful to entire medical team.    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 Fib Diverticulitis     Interim History / Subjective:   Severe shock On CRRT On pressors +multiorgan failure   Antimicrobials:   Antibiotics Given (last 72 hours)     Date/Time Action Medication Dose Rate   04/13/21 2125 New Bag/Given   meropenem (MERREM) 1 g in sodium chloride 0.9 % 100 mL IVPB 1 g 200 mL/hr   04/14/21 0529 New Bag/Given   meropenem (MERREM) 1 g in sodium chloride 0.9 % 100 mL IVPB 1 g 200 mL/hr   04/14/21 1309 New Bag/Given   meropenem (MERREM) 1 g in sodium chloride 0.9 % 100 mL IVPB 1 g 200 mL/hr   04/14/21 2210 New Bag/Given   meropenem (MERREM) 1 g in sodium chloride 0.9 % 100 mL IVPB 1 g 200 mL/hr   04/15/21 0604 New Bag/Given   meropenem (MERREM) 1 g in sodium chloride 0.9 % 100 mL IVPB 1 g 200 mL/hr   04/15/21 1830 New Bag/Given   meropenem (MERREM) 500 mg in sodium chloride 0.9 % 100 mL IVPB 500 mg 200 mL/hr        Objective   Blood pressure (!) 95/38, pulse 90, temperature 99.9 F (37.7 C), temperature source Rectal, resp. rate 20, height _0  (1.651 m), weight 81.7 kg, SpO2 98 %. CVP:  [3 mmHg-43 mmHg] 4 mmHg  Vent Mode: PRVC FiO2 (%):  [40 %-50 %] 40 % Set Rate:  [20 bmp] 20 bmp Vt Set:  [450 mL] 450 mL PEEP:  [5 cmH20] 5 cmH20 Pressure Support:  [5 cmH20] 5 cmH20   Intake/Output Summary (Last 24 hours) at 04/16/2021 1702  Last data filed at 04/16/2021 1600 Gross per 24 hour  Intake 4548.3 ml  Output 400 ml  Net 4148.3 ml    Filed Weights   04/12/21 0318 04/13/21 0500 04/14/21 0417  Weight: 79.1 kg 81.7 kg 81.7 kg      REVIEW OF SYSTEMS  PATIENT IS UNABLE TO PROVIDE COMPLETE REVIEW OF SYSTEMS DUE TO SEVERE CRITICAL ILLNESS AND TOXIC METABOLIC ENCEPHALOPATHY on mechanical ventilation with sedation    PHYSICAL EXAMINATION:  GENERAL:critically ill appearing, +resp distress HEAD: Normocephalic, atraumatic.  EYES: Pupils equal, round, reactive to light.  No scleral icterus.  MOUTH: Moist mucosal membrane. NECK: Supple. +ETT PULMONARY: +rhonchi, CARDIOVASCULAR: S1 and S2. Regular  rate and rhythm. No murmurs, rubs, or gallops.  GASTROINTESTINAL:distended +drains MUSCULOSKELETAL: No swelling, clubbing, or edema.  NEUROLOGIC: GCS4T SKIN:intact,warm,dry   Labs/imaging that I havepersonally reviewed  (right click and "Reselect all SmartList Selections" daily)       ASSESSMENT AND PLAN SYNOPSIS   75 yo female with severe bowel perforation SEPSIS PRESENT ON ADMISSION and perforated ABD with ABD abscess with severe septic shock s/p ex lap with progressive multiorgan failure    SEPTIC SHOCK           Present on admission - due to intraabdominal infection with resistant Klebsiella and large area of infection with perforated viscus s/p surgery with removal of large amount of pus SOURCE-ABD bowel perf -off vasopressors now -plan to repeat CT imaging of abdomen/pelvis today  -ID on case - patient on Merem and diflucan - appreciate input    Severe ACUTE Hypoxic and Hypercapnic Respiratory Failure -continue Mechanical Ventilator support -continue Bronchodilator Therapy -Wean Fio2 and PEEP as tolerated -VAP/VENT bundle implementation  Vent Mode: PRVC FiO2 (%):  [40 %-50 %] 40 % Set Rate:  [20 bmp] 20 bmp Vt Set:  [450 mL] 450 mL PEEP:  [5 cmH20] 5 cmH20 Pressure Support:  [5 cmH20] 5 cmH20 -perform SBT if patient is appropriate    CARDIAC FAILURE-acute MAT, Afib -oxygen as needed -Lasix as tolerated follow up cardiac enzymes as indicated -stopped AMIOdarone today - patient is sinus rhythm with occasional PVCs and PACs - will monitor electorlytes and replete as needed     CARDIAC ICU monitoring   ACUTE KIDNEY INJURY/Renal Failure -continue Foley Catheter-assess need -Avoid nephrotoxic agents -Follow urine output, BMP -Ensure adequate renal perfusion, optimize oxygenation -Renal dose medications On CRRT   Intake/Output Summary (Last 24 hours) at 04/16/2021 1702 Last data filed at 04/16/2021 1600 Gross per 24 hour  Intake 4548.3 ml  Output  400 ml  Net 4148.3 ml      NEUROLOGY Acute toxic metabolic encephalopathy, need for sedation Goal RASS -2 to -3   ENDO - ICU hypoglycemic\Hyperglycemia protocol -check FSBS per protocol   GI GI PROPHYLAXIS as indicated  NUTRITIONAL STATUS DIET-->TPN Constipation protocol as indicated   ELECTROLYTES -follow labs as needed -replace as needed -pharmacy consultation and following   Best practice (right click and "Reselect all SmartList Selections" daily)  Diet:  NPO Pain/Anxiety/Delirium protocol (if indicated): Yes (RASS goal -2) VAP protocol (if indicated): Yes DVT prophylaxis: Subcutaneous Heparin GI prophylaxis: PPI Glucose control:  SSI Yes Central venous access:  Yes, and it is still needed Arterial line:  Yes, and it is still needed Foley:  Yes, and it is still needed Mobility:  bed rest  PT consulted: N/A Code Status:  DNR Disposition: ICU   Labs   CBC: Recent Labs  Lab 04/10/21 0312 04/11/21 0404 04/12/21 0403 04/13/21  1840 04/14/21 0450 04/14/21 1344 04/15/21 0454 04/15/21 1101 04/16/21 0501  WBC 21.8*   < > 46.8* 33.4* 23.4*  --  14.4* 18.9* 22.7*  NEUTROABS 20.4*  --  42.6*  --   --   --  13.1*  --   --   HGB 8.0*   < > 9.2* 7.6* 6.8* 7.8* 8.3* 8.8* 8.7*  HCT 23.8*   < > 28.5* 23.4* 20.3* 23.4* 24.0* 26.1* 26.4*  MCV 83.2   < > 86.4 86.7 84.2  --  83.6 83.9 87.1  PLT 120*   < > 72* 59* 44*  --  41* 52* 78*   < > = values in this interval not displayed.     Basic Metabolic Panel: Recent Labs  Lab 04/13/21 0432 04/13/21 1640 04/14/21 0450 04/14/21 1344 04/15/21 0454 04/15/21 1101 04/16/21 0501  NA 130*   < > 133* 134* 136  135 135 136  K 4.6   < > 4.2 4.0 4.0  4.0 4.0 4.8  CL 104   < > 104 103 105  105 104 105  CO2 24   < > _0 GLUCOSE 204*   < > 199* 194* 185*  184* 175* 123*  BUN 21   < > 27* 28* 32*  31* 39* 76*  CREATININE 0.83   < > 0.59 0.54 0.54  0.47 0.65 1.54*  CALCIUM 7.5*   < > 7.8* 8.1* 8.1*   8.0* 8.2* 8.1*  MG 2.3  --  2.4  --  2.4 2.4 2.5*  PHOS 2.9  2.9   < > 2.2*  2.3* 2.4* 2.7  2.7 2.8 4.4   < > = values in this interval not displayed.    GFR: Estimated Creatinine Clearance: 33.3 mL/min (A) (by C-G formula based on SCr of 1.54 mg/dL (H)). Recent Labs  Lab 04/14/21 0450 04/15/21 0454 04/15/21 1101 04/16/21 0501  PROCALCITON  --   --   --  2.60  WBC 23.4* 14.4* 18.9* 22.7*  LATICACIDVEN  --   --   --  1.1     Liver Function Tests: Recent Labs  Lab 04/11/21 0404 04/11/21 1616 04/12/21 0536 04/12/21 1704 04/13/21 1640 04/14/21 0450 04/14/21 1344 04/15/21 0454 04/15/21 1101  AST 16  --  14*  --   --   --   --  20 22  ALT 19  --  14  --   --   --   --  16 17  ALKPHOS 63  --  69  --   --   --   --  99 105  BILITOT 0.7  --  0.7  --   --   --   --  0.9 0.8  PROT 4.5*  --  4.2*  --   --   --   --  4.9* 5.0*  ALBUMIN 1.7*  1.8*   < > 1.7*   < > 2.1* 2.1* 2.5* 2.3*  2.3* 2.2*   < > = values in this interval not displayed.    No results for input(s): LIPASE, AMYLASE in the last 168 hours.  No results for input(s): AMMONIA in the last 168 hours.  ABG    Component Value Date/Time   PHART 7.38 04/09/2021 0341   PCO2ART 40 04/09/2021 0341   PO2ART 115 (H) 04/09/2021 0341   HCO3 23.7 04/09/2021 0341   ACIDBASEDEF 1.3 04/09/2021 0341   O2SAT 98.4 04/09/2021 0341  Coagulation Profile: Recent Labs  Lab 04/12/21 0536 04/14/21 0450 04/14/21 1344 04/16/21 0501  INR 1.4* 1.4* 1.3* 4.1*     Cardiac Enzymes: No results for input(s): CKTOTAL, CKMB, CKMBINDEX, TROPONINI in the last 168 hours.  HbA1C: Hgb A1c MFr Bld  Date/Time Value Ref Range Status  04/18/2021 04:51 PM 5.7 (H) 4.8 - 5.6 % Final    Comment:    (NOTE)         Prediabetes: 5.7 - 6.4         Diabetes: >6.4         Glycemic control for adults with diabetes: <7.0     CBG: Recent Labs  Lab 04/16/21 0327 04/16/21 0737 04/16/21 1104 04/16/21 1137 04/16/21 1551  GLUCAP  138* 134* 61* 115* 96     Allergies Allergies  Allergen Reactions   Amiodarone     Other reaction(s): Other (See Comments) Nausea, diarrhea, weight loss, anxiety, jitteriness   Levofloxacin Rash    Red palms, lips, forearms    Meloxicam Shortness Of Breath    Other reaction(s): Respiratory Distress   Amlodipine     Other reaction(s): Other (See Comments) Caused severe gingival hyperplasia   Amlodipine Besy-Benazepril Hcl     Severe gingival hyperplasia  Other reaction(s): Other (See Comments) Other reaction(s): Other   Beta Adrenergic Blockers     States severe fatigue  Other reaction(s): Other (See Comments) fatigue   Heparin     HIT workup pending        DVT/GI PRX  assessed I Assessed the need for Labs I Assessed the need for Foley I Assessed the need for Central Venous Line Family Discussion when available I Assessed the need for Mobilization I made an Assessment of medications to be adjusted accordingly Safety Risk assessment completed  CASE DISCUSSED IN MULTIDISCIPLINARY ROUNDS WITH ICU TEAM     Critical Care Time devoted to patient care services described in this note is 33 minutes.  Critical care was necessary to treat or prevent imminent or life-threatening deterioration.   PATIENT WITH VERY POOR PROGNOSIS I ANTICIPATE PROLONGED ICU LOS  Patient with Multiorgan failure and at high risk for cardiac arrest and death.     Ottie Glazier, M.D.  [facility] [address]

## 2021-04-16 NOTE — Progress Notes (Signed)
Pharmacy Antibiotic Note  Vona Whiters is a 75 y.o. female w/ PMH of HTN, DM, CHF, atrial fibrillation, diverticular disease, sigmoid stricture admitted on 04/17/2021 post emergent ex lap for perforated viscus, peritonitis, severe sepsis. Pharmacy has been consulted for meropenem and fluconazole dosing (previously on anidulafungin).  Today, 04/16/2021 Day # 10 antibacterial and antifungal coverage IR placed abdominal drain into fluid collection 7/11 WBC 22.7 (trending up) Persistently febrile, vasopressors re-started Renal - transitioned off CRRT >> HD. Per nephrology, may need to re-start CRRT given instability 7/12 Tracheal aspirate: pending 7/11 Abdominal drain: pending 7/6 JP drain = K pneumoniae (Amp R) 7/3 Blood cx: NG-F Remains VDRF and back on pressors ID consulted 7/7  Plan: Continue fluconazole 200 mg IV q24h Monitor LFTs / DDIs / avoid QT prolonging agents (amiodarone re-started 7/11, low dose, watch for bradycardia) Continue meropenem to 500 mg IV q24h  Continue to follow cultures. Continue to follow RRT plan per nephrology and adjust antimicrobial dosing as indicated.  Height: 5\' 5"  (165.1 cm) Weight: 81.7 kg (180 lb 1.9 oz) IBW/kg (Calculated) : 57  Temp (24hrs), Avg:101.2 F (38.4 C), Min:98.06 F (36.7 C), Max:101.8 F (38.8 C)  Recent Labs  Lab 04/13/21 0432 04/13/21 1640 04/14/21 0450 04/14/21 1344 04/15/21 0454 04/15/21 1101 04/16/21 0501  WBC 33.4*  --  23.4*  --  14.4* 18.9* 22.7*  CREATININE 0.83   < > 0.59 0.54 0.54  0.47 0.65 1.54*  LATICACIDVEN  --   --   --   --   --   --  1.1   < > = values in this interval not displayed.     Estimated Creatinine Clearance: 33.3 mL/min (A) (by C-G formula based on SCr of 1.54 mg/dL (H)).    Allergies  Allergen Reactions   Amiodarone     Other reaction(s): Other (See Comments) Nausea, diarrhea, weight loss, anxiety, jitteriness   Levofloxacin Rash    Red palms, lips, forearms    Meloxicam Shortness Of  Breath    Other reaction(s): Respiratory Distress   Amlodipine     Other reaction(s): Other (See Comments) Caused severe gingival hyperplasia   Amlodipine Besy-Benazepril Hcl     Severe gingival hyperplasia  Other reaction(s): Other (See Comments) Other reaction(s): Other   Beta Adrenergic Blockers     States severe fatigue  Other reaction(s): Other (See Comments) fatigue   Heparin     HIT workup pending    Antimicrobials this admission: Anidulafungin 7/3 >> 7/7 Fluconazole 7/8 >> Zosyn 7/3 >> 7/8 Meropenem 7/8 >>  Microbiology results: 0712 Tracheal aspirate: pending 07/11 Abdominal drain: pending 07/06 JP drain: K pneumo (Amp R) 07/03 BCx: NG - final 07/03 MRSA PCR: negative 07/03 SARS CoV-2: negative 07/03 influenza A/B: negative  Thank you for allowing pharmacy to be a part of this patient's care.  Benita Gutter  04/16/2021 11:43 AM

## 2021-04-16 NOTE — Progress Notes (Signed)
PHARMACY - TOTAL PARENTERAL NUTRITION CONSULT NOTE   Indication:  extensive bowel surgery, poor PO intake PTA  Patient Measurements: Height: 5\' 5"  (165.1 cm) Weight: 81.7 kg (180 lb 1.9 oz) IBW/kg (Calculated) : 57 TPN AdjBW (KG): 63.5 Body mass index is 29.97 kg/m.  Assessment: 75 year old female with minimal PO intake 5 days prior to admission. Imaging showed bowel perforation; patient was taken to the OR for exploratory laparotomy with low anterior resection and colostomy placement. Patient is intubated and sedated in the ICU. Required vasopressors, which have been titrated down. Episode of atrial fibrillation with RVR 7/4, started on amiodarone infusion. Patient has h/o afib s/p ablation on Xarelto; unable to anticoagulate at this time s/t recent surgery. AKI with decreased UOP prior to hospitalization; started on CRRT 7/4 >> transitioned to [facility] 7/11.  Glucose / Insulin: on sensitive SSI + Novolog 4u q4h 24 hr glucose range 59-175 24 hr insulin requirements 37u Some hypoglycemic readings Electrolytes: No deficiencies Renal: CRRT 7/4 - 7/11, transitioning to HD 7/11 Intake / Output: net (+) 18.5 L GI Imaging: 7/3 CT abdomen/pelvis: Bowel perforation with marked inflammatory changes and pneumoperitoneum throughout the abdomen and pelvis. Bowel ischemia not excluded 7/10 CT abdomen/pelvis: Small to moderate amount of fluid within the abdomen and pelvis, primarily anteriorly and on the LEFT, which may be loculated and probably represent infected fluid. Other scattered areas of ill-defined fluid within the abdomen and pelvis, some adjacent to small bowel loops. No gross pneumoperitoneum. 2 cm hypodensity along the posterolateral spleen which may be related to operative injury versus infection. No evidence of bowel obstruction or pneumoperitoneum. GI Surgeries / Procedures:  7/3 ex lap with low anterior resection and colostomy placement  Central access: CVC right IJ TPN start date:  7/4  Nutritional Goals (per RD recommendation on 7/7): kCal: 1455 kcal/day, Protein: 110-125 g, Fluid: 1.4-1.7 L/day Goal TPN rate is 70 mL/hr (provides 117 g of protein and 1434 kcals per day)  Propofol stopped 7/8 Pivot 1.5 cal at 40 mL/hr (goal 40 mL/hr)  Current Nutrition: NPO  Plan:  --Tube feeds are at goal rate. Stop TPN today at 1800 --Continue to follow patient clinical status and ability to tolerate tube feeds --Given intermittent hypoglycemia, will decrease tube feed coverage from Novolog 4u q4h to Novolog 2u q4h  Benita Gutter 04/16/2021,10:53 AM

## 2021-04-16 NOTE — Progress Notes (Signed)
Pharmacy Heparin Induced Thrombocytopenia (HIT) Note:  Lindsay [name] is an 75 y.o. female being evaluated for HIT. Patient has not received heparin other than 2 doses intracatheter for CRRT on 7/6 and 7/8. Platelets were 461 early 7/3 morning when patient presented and drifted down to 266 later that same day. Continued to decline since admission. Patient has been on CRRT since 7/4.  HIT labs were ordered on 7/10 when platelets dropped to 44.   CALCULATE SCORE:  4Ts (see the HIT Algorithm) Score  Thrombocytopenia 1  Timing 1  Thrombosis 1  Other causes of thrombocytopenia 0  Total 3 - low probability of HIT    Per surgeon's note, concern for DIC or HIT. With limited exposure to heparin during CRRT, question whether thrombocytopenia likely multifactorial in setting of continued CRRT and possible DIC.   Plan (Discussed with provider) Labs ordered:  HIT antibody 0.935, SRA pending Heparin allergy:  Heparin allergy documented or updated. Anticoagulation plans:  Begin alternative anticoagulation with argatroban per consult from provider   Goal aPTT: 50-90 seconds  7/11 1101 aPTT = 75 7/11 2137 aPTT = 73  7/12 0501 aPTT = 88  HIT antibody 0.935, SRA pending  Discussed with CCMD risk vs benefit of restarting argatroban. No clinical evidence of bleeding. Concern with elevated INR but expect some interference from argatroban falsely elevating INR. Until SRA returns to provide additional information, will restart argatroban per discussion. Restart argatroban at previously therapeutic rate of 0.45 mcg/kg/min. Check aPTT 7/13 at 0000. CBC daily while on argatroban.   Tawnya Crook, PharmD 04/16/2021 6:05 PM

## 2021-04-16 NOTE — Plan of Care (Signed)
  Problem: Health Behavior/Discharge Planning: Goal: Ability to manage health-related needs will improve Outcome: Not Progressing   Problem: Clinical Measurements: Goal: Ability to maintain clinical measurements within normal limits will improve Outcome: Not Progressing Goal: Will remain free from infection Outcome: Not Progressing Goal: Diagnostic test results will improve Outcome: Not Progressing Goal: Respiratory complications will improve Outcome: Not Progressing Goal: Cardiovascular complication will be avoided Outcome: Not Progressing   Problem: Activity: Goal: Risk for activity intolerance will decrease Outcome: Not Progressing   Problem: Nutrition: Goal: Adequate nutrition will be maintained Outcome: Not Progressing   Problem: Pain Managment: Goal: General experience of comfort will improve Outcome: Not Progressing   Problem: Skin Integrity: Goal: Risk for impaired skin integrity will decrease Outcome: Not Progressing

## 2021-04-16 NOTE — Progress Notes (Signed)
Pharmacy Heparin Induced Thrombocytopenia (HIT) Note:  Lindsay [name] is an 75 y.o. female being evaluated for HIT. Patient has not received heparin other than 2 doses intracatheter for CRRT on 7/6 and 7/8. Platelets were 461 early 7/3 morning when patient presented and drifted down to 266 later that same day. Continued to decline since admission. Patient has been on CRRT since 7/4.  HIT labs were ordered on 7/10 when platelets dropped to 44.   CALCULATE SCORE:  4Ts (see the HIT Algorithm) Score  Thrombocytopenia 1  Timing 1  Thrombosis 1  Other causes of thrombocytopenia 0  Total 3 - low probability of HIT    Per surgeon's note, concern for DIC or HIT. With limited exposure to heparin during CRRT, question whether thrombocytopenia likely multifactorial in setting of continued CRRT and possible DIC.   Plan (Discussed with provider) Labs ordered:  HIT antibody, SRA pending Heparin allergy:  Heparin allergy documented or updated. Anticoagulation plans:  Begin alternative anticoagulation with argatroban per consult from provider   Goal aPTT: 50-90 seconds  7/11 1101 aPTT = 75 7/11 2137 aPTT = 73  7/12 0501 aPTT = 88  Therapeutic x 3 on argatroban at 0.45 mcg/kg/min  Discussed with CCM, will hold argatroban for now. Multiple etiologies could explain thrombocytopenia. Patient is high bleed risk. Risk > benefits Continue to monitor for signs of thrombosis Continue to follow CBC Continue to follow HIT antibody, Burnis Kingfisher  04/16/2021 10:50 AM

## 2021-04-16 NOTE — Progress Notes (Signed)
At arrival to bedside patient's BP readings were soft. ICU RN and ICU MD were at bedside pre Hd and were aware. ICU team verbalized plan to administer vasoactive medications and albumin to aid fluid removal during HD. At initiation of HD, MAP dropped to under 60, Uf was turned off and Dr. Juleen China was called promptly. Verbal instructions were then received to maintain a MAP of 60 or greater with the aid of vasoactive medications given by ICU RN. When MAP did recover to within parameters, the UF was turned back on. Dr. Juleen China was made aware at bedside that we will not be able to achieve his UF order of 2L net due to initial drop in Map and due to new MAP parameters given. Ok to pull as much as tolerated within parameters as per Dr. Juleen China.

## 2021-04-16 NOTE — Progress Notes (Signed)
[facility]  ROUNDING NOTE   Subjective:   IR placed intraabdominal drains yesterday due to purulent fluid collections.   Seen and examined on hemodialysis treatment. Requiring phenylephrine for blood pressure support.     HEMODIALYSIS FLOWSHEET:  Blood Flow Rate (mL/min): 300 mL/min Arterial Pressure (mmHg): -100 mmHg Venous Pressure (mmHg): 100 mmHg Transmembrane Pressure (mmHg): 60 mmHg Ultrafiltration Rate (mL/min): 830 mL/min Dialysate Flow Rate (mL/min): 500 ml/min Conductivity: Machine : 13.5 Conductivity: Machine : 13.5 Dialysis Fluid Bolus: Normal Saline Bolus Amount (mL): 0 mL   Objective:  Vital signs in last 24 hours:  Temp:  [97.3 F (36.3 C)-101.8 F (38.8 C)] 100.9 F (38.3 C) (07/12 1100) Pulse Rate:  [25-153] 100 (07/12 1100) Resp:  [17-31] 20 (07/12 1100) BP: (64-200)/(34-163) 137/51 (07/12 1100) SpO2:  [88 %-97 %] 97 % (07/12 1100) Arterial Line BP: (64-232)/(30-73) 154/54 (07/12 1000) FiO2 (%):  [30 %-50 %] 50 % (07/12 0755)  Weight change:  Filed Weights   04/12/21 0318 04/13/21 0500 04/14/21 0417  Weight: 79.1 kg 81.7 kg 81.7 kg    Intake/Output: I/O last 3 completed shifts: In: 2956.3 [I.V.:1972.4; NG/GT:684; IV Piggyback:299.9] Out: 2947 [Urine:37; Drains:215; AQTMA:2633; Stool:300]   Intake/Output this shift:  Total I/O In: 3410.4 [I.V.:2520.4; NG/GT:640; IV Piggyback:250] Out: -   Physical Exam: General: Critically ill  Head: ETT  Eyes: Anicteric  Neck: Supple  Lungs:  PRVC fiO2 24%  Heart: irregular  Abdomen:  +midline incision  Extremities: + dependent edema upper extremities  Neurologic: Intubated, sedated  Skin: No acute rash  Access: Left IJ temporary dialysis catheter    Basic Metabolic Panel: Recent Labs  Lab 04/13/21 0432 04/13/21 1640 04/14/21 0450 04/14/21 1344 04/15/21 0454 04/15/21 1101 04/16/21 0501  NA 130*   < > 133* 134* 136  135 135 136  K 4.6   < > 4.2 4.0 4.0  4.0 4.0 4.8  CL 104    < > 104 103 105  105 104 105  CO2 24   < > '26 25 27  27 25 23  ' GLUCOSE 204*   < > 199* 194* 185*  184* 175* 123*  BUN 21   < > 27* 28* 32*  31* 39* 76*  CREATININE 0.83   < > 0.59 0.54 0.54  0.47 0.65 1.54*  CALCIUM 7.5*   < > 7.8* 8.1* 8.1*  8.0* 8.2* 8.1*  MG 2.3  --  2.4  --  2.4 2.4 2.5*  PHOS 2.9  2.9   < > 2.2*  2.3* 2.4* 2.7  2.7 2.8 4.4   < > = values in this interval not displayed.     Liver Function Tests: Recent Labs  Lab 04/11/21 0404 04/11/21 1616 04/12/21 0536 04/12/21 1704 04/13/21 1640 04/14/21 0450 04/14/21 1344 04/15/21 0454 04/15/21 1101  AST 16  --  14*  --   --   --   --  20 22  ALT 19  --  14  --   --   --   --  16 17  ALKPHOS 63  --  69  --   --   --   --  99 105  BILITOT 0.7  --  0.7  --   --   --   --  0.9 0.8  PROT 4.5*  --  4.2*  --   --   --   --  4.9* 5.0*  ALBUMIN 1.7*  1.8*   < > 1.7*   < >  2.1* 2.1* 2.5* 2.3*  2.3* 2.2*   < > = values in this interval not displayed.    No results for input(s): LIPASE, AMYLASE in the last 168 hours.  No results for input(s): AMMONIA in the last 168 hours.  CBC: Recent Labs  Lab 04/10/21 0312 04/11/21 0404 04/12/21 0403 04/13/21 0432 04/14/21 0450 04/14/21 1344 04/15/21 0454 04/15/21 1101 04/16/21 0501  WBC 21.8*   < > 46.8* 33.4* 23.4*  --  14.4* 18.9* 22.7*  NEUTROABS 20.4*  --  42.6*  --   --   --  13.1*  --   --   HGB 8.0*   < > 9.2* 7.6* 6.8* 7.8* 8.3* 8.8* 8.7*  HCT 23.8*   < > 28.5* 23.4* 20.3* 23.4* 24.0* 26.1* 26.4*  MCV 83.2   < > 86.4 86.7 84.2  --  83.6 83.9 87.1  PLT 120*   < > 72* 59* 44*  --  41* 52* 78*   < > = values in this interval not displayed.     Cardiac Enzymes: No results for input(s): CKTOTAL, CKMB, CKMBINDEX, TROPONINI in the last 168 hours.  BNP: Invalid input(s): POCBNP  CBG: Recent Labs  Lab 04/15/21 1947 04/15/21 2340 04/16/21 0327 04/16/21 0737 04/16/21 1104  GLUCAP 115* 129* 138* 134* 3*     Microbiology: Results for orders placed  or performed during the hospital encounter of 04/23/2021  Resp Panel by RT-PCR (Flu A&B, Covid) Nasopharyngeal Swab     Status: None   Collection Time: 04/26/2021  4:40 AM   Specimen: Nasopharyngeal Swab; Nasopharyngeal(NP) swabs in vial transport medium  Result Value Ref Range Status   SARS Coronavirus 2 by RT PCR NEGATIVE NEGATIVE Final    Comment: (NOTE) SARS-CoV-2 target nucleic acids are NOT DETECTED.  The SARS-CoV-2 RNA is generally detectable in upper respiratory specimens during the acute phase of infection. The lowest concentration of SARS-CoV-2 viral copies this assay can detect is 138 copies/mL. A negative result does not preclude SARS-Cov-2 infection and should not be used as the sole basis for treatment or other patient management decisions. A negative result may occur with  improper specimen collection/handling, submission of specimen other than nasopharyngeal swab, presence of viral mutation(s) within the areas targeted by this assay, and inadequate number of viral copies(<138 copies/mL). A negative result must be combined with clinical observations, patient history, and epidemiological information. The expected result is Negative.  Fact Sheet for Patients:  EntrepreneurPulse.com.au  Fact Sheet for Healthcare Providers:  IncredibleEmployment.be  This test is no t yet approved or cleared by the [place] FDA and  has been authorized for detection and/or diagnosis of SARS-CoV-2 by FDA under an Emergency Use Authorization (EUA). This EUA will remain  in effect (meaning this test can be used) for the duration of the COVID-19 declaration under Section 564(b)(1) of the Act, 21 U.S.C.section 360bbb-3(b)(1), unless the authorization is terminated  or revoked sooner.       Influenza A by PCR NEGATIVE NEGATIVE Final   Influenza B by PCR NEGATIVE NEGATIVE Final    Comment: (NOTE) The Xpert Xpress SARS-CoV-2/FLU/RSV plus assay is intended  as an aid in the diagnosis of influenza from Nasopharyngeal swab specimens and should not be used as a sole basis for treatment. Nasal washings and aspirates are unacceptable for Xpert Xpress SARS-CoV-2/FLU/RSV testing.  Fact Sheet for Patients: EntrepreneurPulse.com.au  Fact Sheet for Healthcare Providers: IncredibleEmployment.be  This test is not yet approved or cleared by the [place] and  has been authorized for detection and/or diagnosis of SARS-CoV-2 by FDA under an Emergency Use Authorization (EUA). This EUA will remain in effect (meaning this test can be used) for the duration of the COVID-19 declaration under Section 564(b)(1) of the Act, 21 U.S.C. section 360bbb-3(b)(1), unless the authorization is terminated or revoked.  Performed at [facility], [address] [place], [place] [address]   Blood culture (routine x 2)     Status: None   Collection Time: 04/16/2021  5:33 AM   Specimen: BLOOD  Result Value Ref Range Status   Specimen Description BLOOD LEFT ANTECUBITAL  Final   Special Requests   Final    BOTTLES DRAWN AEROBIC AND ANAEROBIC Blood Culture adequate volume   Culture   Final    NO GROWTH 5 DAYS Performed at [facility], [address] [place], [place] [address]    Report Status 04/12/2021 FINAL  Final  Blood culture (routine x 2)     Status: None   Collection Time: 04/05/2021  7:40 AM   Specimen: BLOOD  Result Value Ref Range Status   Specimen Description BLOOD BLOOD RIGHT WRIST  Final   Special Requests   Final    BOTTLES DRAWN AEROBIC AND ANAEROBIC Blood Culture adequate volume   Culture   Final    NO GROWTH 5 DAYS Performed at [facility], [address] [place], [place] [address]    Report Status 04/12/2021 FINAL  Final  MRSA Next Gen by PCR, Nasal     Status: None   Collection Time: 04/17/2021 12:50 PM   Specimen: Nasal Mucosa; Nasal Swab  Result Value Ref Range Status    MRSA by PCR Next Gen NOT DETECTED NOT DETECTED Final    Comment: (NOTE) The GeneXpert MRSA Assay (FDA approved for NASAL specimens only), is one component of a comprehensive MRSA colonization surveillance program. It is not intended to diagnose MRSA infection nor to guide or monitor treatment for MRSA infections. Test performance is not FDA approved in patients less than 33 years old. Performed at [facility], [address] [place], [place] [address]   Body fluid culture w Gram Stain     Status: None   Collection Time: 04/10/21  4:30 PM   Specimen: JP Drain; Body Fluid  Result Value Ref Range Status   Specimen Description   Final    JP DRAINAGE Performed at [facility], [address] [place], [place] [address]    Special Requests   Final    Normal Performed at [facility], [address] [place], [place] [address]    Gram Stain   Final    ABUNDANT WBC PRESENT, PREDOMINANTLY PMN NO ORGANISMS SEEN    Culture   Final    RARE KLEBSIELLA PNEUMONIAE CRITICAL RESULT CALLED TO, READ BACK BY AND VERIFIED WITH: RN E.JENON AT 0945 ON 04/11/2021 BY T.SAAD. Performed at [facility], [place] [address], [place], [place] [address]    Report Status 04/14/2021 FINAL  Final   Organism ID, Bacteria KLEBSIELLA PNEUMONIAE  Final      Susceptibility   Klebsiella pneumoniae - MIC*    AMPICILLIN RESISTANT Resistant     CEFAZOLIN <=4 SENSITIVE Sensitive     CEFEPIME <=0.12 SENSITIVE Sensitive     CEFTAZIDIME <=1 SENSITIVE Sensitive     CEFTRIAXONE <=0.25 SENSITIVE Sensitive     CIPROFLOXACIN <=0.25 SENSITIVE Sensitive     GENTAMICIN <=1 SENSITIVE Sensitive     IMIPENEM <=  0.25 SENSITIVE Sensitive     TRIMETH/SULFA <=20 SENSITIVE Sensitive     AMPICILLIN/SULBACTAM <=2 SENSITIVE Sensitive     PIP/TAZO <=4 SENSITIVE Sensitive     * RARE KLEBSIELLA PNEUMONIAE  Aerobic/Anaerobic Culture w Gram Stain (surgical/deep wound)     Status: None (Preliminary  result)   Collection Time: 04/15/21  4:21 PM   Specimen: Abdomen; Abdominal Fluid  Result Value Ref Range Status   Specimen Description   Final    ABDOMEN Performed at [facility], [address] [place], [place] [address]    Special Requests   Final    Normal Performed at [facility], [address] [place], [place] [address]    Gram Stain   Final    RARE WBC PRESENT,BOTH PMN AND MONONUCLEAR NO ORGANISMS SEEN Performed at [facility], [place] [address] [place], [place] [address]    Culture PENDING  Incomplete   Report Status PENDING  Incomplete    Coagulation Studies: Recent Labs    04/14/21 0450 04/14/21 1344 04/16/21 0501  LABPROT 16.8* 16.3* 39.8*  INR 1.4* 1.3* 4.1*     Urinalysis: No results for input(s): COLORURINE, LABSPEC, PHURINE, GLUCOSEU, HGBUR, BILIRUBINUR, KETONESUR, PROTEINUR, UROBILINOGEN, NITRITE, LEUKOCYTESUR in the last 72 hours.  Invalid input(s): APPERANCEUR     Imaging: CT ABDOMEN PELVIS W CONTRAST  Result Date: 04/14/2021 CLINICAL DATA:  75 year old female with recent bowel perforation and partial colectomy and colostomy. Continued sepsis. Small splenic laceration during surgery. EXAM: CT ABDOMEN AND PELVIS WITH CONTRAST TECHNIQUE: Multidetector CT imaging of the abdomen and pelvis was performed using the standard protocol following bolus administration of intravenous contrast. CONTRAST:  25m OMNIPAQUE IOHEXOL 300 MG/ML  SOLN COMPARISON:  04/09/2021 CT FINDINGS: Lower chest: Moderate bilateral pleural effusions and bilateral LOWER lung atelectasis noted. Airspace opacities within the lingula and LEFT LOWER lobe are likely represent pneumonia. Cardiomegaly and pacemaker/AICD again noted. Hepatobiliary: Hepatic cysts are again noted without other hepatic abnormality. The patient is status post cholecystectomy. No biliary dilatation identified. Pancreas: Unremarkable Spleen: A 2 cm hypodensity along the posterolateral spleen  is noted which may be related to operative injury versus infection. Adrenals/Urinary Tract: The kidneys and adrenal glands are unremarkable. A Foley catheter is present within the bladder. Stomach/Bowel: LEFT sided ostomy is noted. No dilated bowel loops/bowel obstruction noted. There is no evidence of pneumoperitoneum. Mild circumferential small bowel wall enhancement/possible thickening may be reactive. Vascular/Lymphatic: Aortic atherosclerosis. No enlarged abdominal or pelvic lymph nodes. Reproductive: Uterus and bilateral adnexa are unremarkable. Other: Surgical drains within the abdomen are noted. There is a small to moderate amount of fluid, primarily anteriorly and on the LEFT, which may be loculated and likely represents infected fluid. Other scattered areas of ill-defined fluid within the abdomen and pelvis noted, some adjacent to small bowel loops. No gross pneumoperitoneum is identified. Diffuse subcutaneous edema is noted. Musculoskeletal: No acute or suspicious bony abnormalities identified. IMPRESSION: 1. Small to moderate amount of fluid within the abdomen and pelvis, primarily anteriorly and on the LEFT, which may be loculated and probably represent infected fluid. Other scattered areas of ill-defined fluid within the abdomen and pelvis, some adjacent to small bowel loops. No gross pneumoperitoneum. 2. Airspace opacities within the lingula and LEFT LOWER lobe likely represent pneumonia. 3. Moderate bilateral pleural effusions and bilateral LOWER lung atelectasis. 4. 2 cm hypodensity along the posterolateral spleen which may be related to operative injury versus infection. 5. Mild small bowel wall thickening/enhancement which may be reactive. No  evidence of bowel obstruction or pneumoperitoneum. 6. Aortic Atherosclerosis (ICD10-I70.0). Electronically Signed   By: Margarette Canada M.D.   On: 04/14/2021 16:14   DG Chest Port 1 View  Result Date: 04/15/2021 CLINICAL DATA:  ETT placement. EXAM: PORTABLE  CHEST 1 VIEW COMPARISON:  04/08/2021 FINDINGS: 1124 hours. Endotracheal tube tip is 4.8 cm above the base of the carina. The NG tube passes into the stomach although the distal tip position is not included on the film. Left IJ central line tip overlies the lower right atrium given leftward patient rotation. Right IJ central line tip also noted region of lower right atrium. Interval progression of vascular congestion with worsening bibasilar atelectasis or infiltrate. Small bilateral pleural effusions noted. Surgical drain identified left upper abdomen. IMPRESSION: 1. Interval progression of vascular congestion with worsening bibasilar atelectasis or infiltrate. 2. Small bilateral pleural effusions. 3. Stable support apparatus. Electronically Signed   By: Misty Stanley M.D.   On: 04/15/2021 11:46   CT IMAGE GUIDED DRAINAGE BY PERCUTANEOUS CATHETER  Result Date: 04/15/2021 INDICATION: 75 year old female status post perforated colon status post exploratory laparotomy. Residual abdominal fluid resembling ascites with possible mild loculation seen on CT imaging. Drain placement requested. EXAM: CT-guided drain placement MEDICATIONS: The patient is currently admitted to the hospital and receiving intravenous antibiotics. The antibiotics were administered within an appropriate time frame prior to the initiation of the procedure. ANESTHESIA/SEDATION: None. COMPLICATIONS: None immediate. PROCEDURE: Informed written consent was obtained from the patient after a thorough discussion of the procedural risks, benefits and alternatives. All questions were addressed. Maximal Sterile Barrier Technique was utilized including caps, mask, sterile gowns, sterile gloves, sterile drape, hand hygiene and skin antiseptic. A timeout was performed prior to the initiation of the procedure. A planning axial CT scan was performed. The fluid layering along the left pericolic gutter was localized. The skin was marked. The overlying skin was  then sterilely prepped and draped in standard fashion using chlorhexidine skin prep. Local anesthesia was attained by infiltration with 1% lidocaine. A small dermatotomy was made. Under intermittent CT guidance, an 18 gauge trocar needle was advanced into the fluid. A 0.035 wire was then advanced into the fluid. The needle was removed. The subcutaneous tract was dilated to 10 [place]. A Cook 10 [place] all-purpose drainage catheter was advanced over the wire and formed in the fluid. Aspiration yields mildly turbid serosanguineous ascites. Several 100 cc of fluid was aspirated. Follow-up CT imaging demonstrates a well-positioned drainage catheter in the expected location. The catheter was secured to the skin with 0 Prolene suture and connected to JP bulb suction. IMPRESSION: Successful placement of 10 French drainage catheter. Evacuation fluid is serosanguineous and mildly turbid consistent with ascites. Samples were sent for culture. Electronically Signed   By: Jacqulynn Cadet M.D.   On: 04/15/2021 16:55     Medications:    amiodarone 15 mg/hr (04/16/21 1000)   argatroban Stopped (04/16/21 1109)   dexmedetomidine (PRECEDEX) IV infusion 1.2 mcg/kg/hr (04/16/21 1000)   fentaNYL infusion INTRAVENOUS 300 mcg/hr (04/16/21 1000)   fluconazole (DIFLUCAN) IV Stopped (04/15/21 2025)   meropenem (MERREM) IV Stopped (04/15/21 1900)   phenylephrine (NEO-SYNEPHRINE) Adult infusion 150 mcg/min (04/16/21 0910)   TPN ADULT (ION) 35 mL/hr at 04/16/21 1000    sodium chloride   Intravenous Once   chlorhexidine gluconate (MEDLINE KIT)  15 mL Mouth Rinse BID   Chlorhexidine Gluconate Cloth  6 each Topical Q0600   dextrose  25 mL Intravenous Once   feeding supplement (PIVOT  1.5 CAL)  1,000 mL Per Tube Q24H   feeding supplement (PROSource TF)  45 mL Per Tube TID   free water  30 mL Per Tube Q4H   heparin sodium (porcine)       insulin aspart  0-20 Units Subcutaneous Q4H   insulin aspart  4 Units Subcutaneous Q4H    mouth rinse  15 mL Mouth Rinse 10 times per day   multivitamin  15 mL Per Tube Daily   nutrition supplement (JUVEN)  1 packet Per Tube BID BM   pantoprazole (PROTONIX) IV  40 mg Intravenous Daily   acetaminophen **OR** acetaminophen, fentaNYL, midazolam, polyvinyl alcohol  Assessment/ Plan:   Ms. Lindsay [name] is a 75 y.o. white female with hypertrophic cardiomyopathy, ventricular tachycardia status post AICD placement, atrial fibrillation on rivaroxaban, diabetes mellitus type II, hypertension, history of diverticulitis, osteopenia, hyperlipidemia, GERD who was admitted to [facility] on 04/10/2021 for Hyponatremia [E87.1] Bowel perforation ([place]) [K63.1] Acute renal failure, unspecified acute renal failure type ([place]ov) [N17.9] She was found to have bowel perforation with significant pneumoperitoneum. ExLap by Dr. Celine Ahr on 7/3 .    1.  Acute kidney injury with baseline normal creatinine with Metabolic acidosis and hyponatremia.-  Anuric. No signs of renal recovery.  Intermittent hemodialysis treatment today. If continues to need vasopressors, will transition back to CRRT.   2. Bowel perforation with purulent peritoneum. Status post ExLap. Now with wound vac. Klebsiella pneumoniae. Appreciate surgery and infectious disease input  3.  Acute respiratory failure: requiring intubation and mechanical ventilation.  Appreciate critical care input.   4. Septic shock with hypotension. Requiring vasopressors. Currently on phenylephrine. Off stress dose steroids Empiric meropenem and fluconazole.   5. Anemia with renal failure: status post transfusions. Hemoglobin 8.7.     LOS: 9 Lindsay [name] 7/12/202211:11 AM

## 2021-04-16 NOTE — Progress Notes (Signed)
This patient has a contra indication/ allergy documented in her records to Heparin. Suspected HIT. CVC will be locked with normal saline only and capped, clamped post HD. Dr. Juleen China is aware.

## 2021-04-16 NOTE — Progress Notes (Addendum)
Very difficult to maintain normal blood pressure, multiple versed pushes administered, Versed will lower blood pressure quickly but will drop too much, with ordered dose, then blood pressure will swing back up quickly. Placed cool damp washcloth over eyes because the patient does not seem able to close eyes completely and her eyes are becoming reddened and dry. Turned q2 hours, pt has actually started producing urine again, approximately 25 ml's of urine produced. Has temp of 101.3, attempted tylenol, but it is not effective.

## 2021-04-16 NOTE — Progress Notes (Signed)
Pharmacy Heparin Induced Thrombocytopenia (HIT) Note:  Lindsay [name] is an 75 y.o. female being evaluated for HIT. Patient has not received heparin other than 2 doses intracatheter for CRRT on 7/6 and 7/8. Platelets were 461 early 7/3 morning when patient presented and drifted down to 266 later that same day. Continued to decline since admission. Patient has been on CRRT since 7/4.  HIT labs were ordered on 7/10 when platelets dropped to 44.   CALCULATE SCORE:  4Ts (see the HIT Algorithm) Score  Thrombocytopenia 1  Timing 1  Thrombosis 1  Other causes of thrombocytopenia 0  Total 3 - low probability of HIT    Per surgeon's note, concern for DIC or HIT. With limited exposure to heparin during CRRT, question whether thrombocytopenia likely multifactorial in setting of continued CRRT and possible DIC.   Plan (Discussed with provider) Labs ordered:  HIT antibody, SRA pending Heparin allergy:  Heparin allergy documented or updated. Anticoagulation plans:  Begin alternative anticoagulation with argatroban per consult from provider   Goal aPTT: 50-90 seconds  7/11 1101 aPTT = 75 7/11 2137 aPTT = 73  7/12 0501 aPTT = 88  aPTT therapeutic x 3, continue argatroban at 0.45 mcg/kg/min Due to aPTT trending up, will recheck aPTT in 12 hrs Daily CBC while on argatroban Continue to follow HIT antibody, SRA   Renda Rolls, PharmD, [facility] 04/16/2021 7:13 AM

## 2021-04-16 NOTE — Progress Notes (Addendum)
Daily Progress Note   Patient Name: Lindsay [name]       Date: 04/16/2021 DOB: 19-Mar-1946  Age: 75 y.o. MRN#: [id number] Attending Physician: Ottie Glazier, MD Primary Care Physician: Rusty Aus, MD Admit Date: 05/05/2021  Reason for Consultation/Follow-up: Establishing goals of care  Subjective: Patient is resting in bed on ventilator with washcloth over eyes. Spoke with husband.  He states he was poised to make a decision regarding care moving forward yesterday, but states he was pleasantly surprised by optimism and conversation regarding continuing all care at this time. He states he would like to speak with the teams involved regarding their thoughts on care and prognosis. He states from this point, he would like to do whatever CCM believes should be done for his wife. Discussed the importance of ongoing discussions with the medical teams, and continuing to keep a focus on what his wife would want, and what QOL and overall status would be acceptable to her. He discusses conversation regarding her ICD, and he would like to continue with her ICD activated. Would recommend reversing her code status to full code if ICD will remain activated. Patient's eyes do not remain closed. Recommend lubricating her eyes and taping them shut to protect corneas, or consulting ophthalmology for consideration of tarsorrhaphy.    Length of Stay: 9  Current Medications: Scheduled Meds:   sodium chloride   Intravenous Once   chlorhexidine gluconate (MEDLINE KIT)  15 mL Mouth Rinse BID   Chlorhexidine Gluconate Cloth  6 each Topical Q0600   feeding supplement (PIVOT 1.5 CAL)  1,000 mL Per Tube Q24H   feeding supplement (PROSource TF)  45 mL Per Tube TID   free water  30 mL Per Tube Q4H   heparin sodium  (porcine)       insulin aspart  0-20 Units Subcutaneous Q4H   insulin aspart  2 Units Subcutaneous Q4H   mouth rinse  15 mL Mouth Rinse 10 times per day   multivitamin  15 mL Per Tube Daily   nutrition supplement (JUVEN)  1 packet Per Tube BID BM   pantoprazole (PROTONIX) IV  40 mg Intravenous Daily    Continuous Infusions:  amiodarone 10 mg/hr (04/16/21 1300)   dexmedetomidine (PRECEDEX) IV infusion 1.2 mcg/kg/hr (04/16/21 1306)   fentaNYL infusion INTRAVENOUS 300 mcg/hr (04/16/21 1300)  fluconazole (DIFLUCAN) IV Stopped (04/15/21 2025)   meropenem (MERREM) IV Stopped (04/15/21 1900)   phenylephrine (NEO-SYNEPHRINE) Adult infusion 40 mcg/min (04/16/21 1349)   TPN ADULT (ION) 35 mL/hr at 04/16/21 1300    PRN Meds: acetaminophen **OR** acetaminophen, fentaNYL, midazolam, polyvinyl alcohol  Physical Exam Constitutional:      Comments: On ventilator.             Vital Signs: BP (!) 117/45   Pulse 100   Temp (!) 101.3 F (38.5 C)   Resp 20   Ht _0  (1.651 m)   Wt 81.7 kg   SpO2 100%   BMI 29.97 kg/m  SpO2: SpO2: 100 % O2 Device: O2 Device: Ventilator O2 Flow Rate:    Intake/output summary:  Intake/Output Summary (Last 24 hours) at 04/16/2021 1406 Last data filed at 04/16/2021 1306 Gross per 24 hour  Intake 4091.33 ml  Output 497 ml  Net 3594.33 ml   LBM: Last BM Date: 04/16/21 Baseline Weight: Weight: 63.5 kg Most recent weight: Weight: 81.7 kg    Patient Active Problem List   Diagnosis Date Noted   Pressure injury of skin 04/15/2021   Coagulopathy ([place]) 04/08/2021   Acute respiratory failure ([place]) 04/08/2021   On mechanically assisted ventilation ([place]) 04/08/2021   Acute blood loss anemia 04/08/2021   Peritonitis ([place]) 04/23/2021   Severe sepsis ([place]) 04/23/2021   Acute renal failure (ARF) ([place]) 04/18/2021   Lactic acidosis 04/25/2021   Volume depletion 04/29/2021   Bowel perforation ([place])    Defibrillator discharge 05/09/2020   Atrial fibrillation  with rapid ventricular response ([place]) 05/09/2020   Anticoagulated 12/30/2019   ICD (implantable cardioverter-defibrillator), dual, in situ 07/26/2019   Gastro-esophageal reflux disease with esophagitis 12/30/2017    Palliative Care Assessment & Plan     Recommendations/Plan: Husband would like to work with CCM on goals of care, and planning moving forward.  Recommend reversing her code status to full code if her ICD will remain activated.  As her eyes will not close, would recommend eye lubrication and taping her eyes closed to protect corneas, or contacting ophthalmology for consideration of tarsorrhaphy.   Code Status:    Code Status Orders  (From admission, onward)           Start     Ordered   04/08/21 1540  Do not attempt resuscitation (DNR)  Continuous       Question Answer Comment  In the event of cardiac or respiratory ARREST Do not call a "code blue"   In the event of cardiac or respiratory ARREST Do not perform Intubation, CPR, defibrillation or ACLS   In the event of cardiac or respiratory ARREST Use medication by any route, position, wound care, and other measures to relive pain and suffering. May use oxygen, suction and manual treatment of airway obstruction as needed for comfort.      04/08/21 1539           Code Status History     Date Active Date Inactive Code Status Order ID Comments User Context   04/30/2021 1348 04/08/2021 1539 Full Code [id number]  Tyler Pita, MD Inpatient   05/09/2020 0112 05/09/2020 2020 Full Code [id number]  Athena Masse, MD ED       Prognosis:  Very poor   Care plan was discussed with CCM   Thank you for allowing the Palliative Medicine Team to assist in the care of this patient.       Total Time 35  min Prolonged Time Billed  no       Greater than 50%  of this time was spent counseling and coordinating care related to the above assessment and plan.  Asencion Gowda, NP  Please contact Palliative Medicine Team  phone at [phone] for questions and concerns.

## 2021-04-16 NOTE — Progress Notes (Signed)
[facility] Hospital Day(s): 9.   Post op day(s): 9 Days Post-Op.   Interval History:  Patient seen and examined Remains intubated' sedated Amiodarone at 30 ml/hr Off vasopressors Leukocytosis worse this morning; 22.7 PLT 78K; HIT pending Renal function slightly worse; sCr - 1.56; UO - 37 ccs Lactic acid level normal at 1.1 Surgical drains             - RUQ: 25 ccs out; serosanguinous             - RLQ: 0 ccs out; serosanguinous Percutaneous drain in left abdomen by IR (07/11); 140 ccs out; NGTD on Cx Colostomy retracted, stool in bag Wound vac to midline wound; WOC to follow  On TPN (70 ml/hr) + TF (40 ml/hr) On Meropenem; ID following  Vital signs in last 24 hours: [min-max] current  Temp:  [92.66 F (33.7 C)-101.8 F (38.8 C)] 101.3 F (38.5 C) (07/12 3875) Pulse Rate:  [25-153] 98 (07/12 0652) Resp:  [17-31] 20 (07/12 0652) BP: (64-200)/(34-163) 106/49 (07/12 0650) SpO2:  [88 %-97 %] 96 % (07/12 0652) Arterial Line BP: (64-232)/(30-73) 111/38 (07/12 0652) FiO2 (%):  [24 %-50 %] 50 % (07/12 0435)     Height: 5\' 5"  (165.1 cm) Weight: 81.7 kg BMI (Calculated): 29.97   Intake/Output last 2 shifts:  07/11 0701 - 07/12 0700 In: 1502 [I.V.:699.7; NG/GT:684; IV Piggyback:118.3] Out: 541 [Urine:37; Drains:215; Stool:50]   Physical Exam:  Constitutional: Intubated; Sedated HEENT: NGT in place; tube feedings Respiratory: on ventilator  Cardiovascular: Regular rate; few PVCs Gastrointestinal: Soft, unable to assess tenderness; non-distended, no rebound/guarding. 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 Newly placed drain in left abdomen; serous  Genitourinary: Foley in place  Integumentary: Midline wound, measuring 21 x 6 x 3 cm, Wound vac in place  Labs:  CBC Latest Ref Rng & Units 04/16/2021 04/15/2021 04/15/2021  WBC 4.0 - 10.5 K/uL 22.7(H) 18.9(H)  14.4(H)  Hemoglobin 12.0 - 15.0 g/dL 8.7(L) 8.8(L) 8.3(L)  Hematocrit 36.0 - 46.0 % 26.4(L) 26.1(L) 24.0(L)  Platelets 150 - 400 K/uL 78(L) 52(L) 41(L)   CMP Latest Ref Rng & Units 04/16/2021 04/15/2021 04/15/2021  Glucose 70 - 99 mg/dL 123(H) 175(H) 185(H)  BUN 8 - 23 mg/dL 76(H) 39(H) 32(H)  Creatinine 0.44 - 1.00 mg/dL 1.54(H) 0.65 0.54  Sodium 135 - 145 mmol/L 136 135 136  Potassium 3.5 - 5.1 mmol/L 4.8 4.0 4.0  Chloride 98 - 111 mmol/L 105 104 105  CO2 22 - 32 mmol/L 23 25 27   Calcium 8.9 - 10.3 mg/dL 8.1(L) 8.2(L) 8.1(L)  Total Protein 6.5 - 8.1 g/dL - 5.0(L) 4.9(L)  Total Bilirubin 0.3 - 1.2 mg/dL - 0.8 0.9  Alkaline Phos 38 - 126 U/L - 105 99  AST 15 - 41 U/L - 22 20  ALT 0 - 44 U/L - 17 16    Imaging studies: No new pertinent imaging studies   Assessment/Plan:  75 y.o. female with requiring ventilator support 9 Days Post-Op s/p exploratory laparotomy, hartman's procedure, salpingo-oopherectomy, and placement of wound vac for perforated diverticulitis with frankly purulent peritonitis   - Agree with palliative care involvement  - Appreciate PCCM assistance; ventilator and vasopressor support pre their service - Appreciate nephrology assistance; CRRT  - Continue TPN; at goal, okay to wean once tube feedings to goal - Continue tube feedings;. Advance to goal as tolerated - Continue IV Abx (Meropenem); ID  following; follow Cx from drain placement on 07/11 - Monitor abdominal examination; on-going colostomy function              - Midline wound care: Wound vac; MWF schedule --> WOC will take over; appreciate their assistance             - Pain control prn - Monitor leukocytosis; worse this morning - Monitor platelets; HIT pending              - Monitor renal function; worsening   All of the above findings and recommendations were discussed with the medical team.   -- Edison Simon, PA-C [facility] 04/16/2021, 7:14 AM [phone] M-F: 7am - 4pm

## 2021-04-16 NOTE — Progress Notes (Signed)
This patient had 3 hours of HD today as per orders. UF goal was 2L net as tolerated per order, unable to achieve the full UF goal as ordered due to MAP drop after initiation and verbal parameters provided at that time by Nephrologist. Net uf today was 1.9L . MAP is stable post rinse back. Report given to ICU RN. Nephrologist has been made aware as per discussion earlier in HD at bedside.

## 2021-04-17 ENCOUNTER — Inpatient Hospital Stay: Payer: Medicare Other

## 2021-04-17 DIAGNOSIS — K651 Peritoneal abscess: Secondary | ICD-10-CM | POA: Diagnosis not present

## 2021-04-17 DIAGNOSIS — K631 Perforation of intestine (nontraumatic): Secondary | ICD-10-CM | POA: Diagnosis not present

## 2021-04-17 LAB — GLUCOSE, CAPILLARY
Glucose-Capillary: 143 mg/dL — ABNORMAL HIGH (ref 70–99)
Glucose-Capillary: 183 mg/dL — ABNORMAL HIGH (ref 70–99)
Glucose-Capillary: 192 mg/dL — ABNORMAL HIGH (ref 70–99)
Glucose-Capillary: 121 mg/dL — ABNORMAL HIGH (ref 70–99)
Glucose-Capillary: 128 mg/dL — ABNORMAL HIGH (ref 70–99)
Glucose-Capillary: 135 mg/dL — ABNORMAL HIGH (ref 70–99)

## 2021-04-17 LAB — RENAL FUNCTION PANEL
Albumin: 1.8 g/dL — ABNORMAL LOW (ref 3.5–5.0)
Albumin: 2 g/dL — ABNORMAL LOW (ref 3.5–5.0)
Anion gap: 5 (ref 5–15)
Anion gap: 11 (ref 5–15)
BUN: 72 mg/dL — ABNORMAL HIGH (ref 8–23)
BUN: 69 mg/dL — ABNORMAL HIGH (ref 8–23)
CO2: 24 mmol/L (ref 22–32)
CO2: 24 mmol/L (ref 22–32)
Calcium: 7.7 mg/dL — ABNORMAL LOW (ref 8.9–10.3)
Calcium: 8.6 mg/dL — ABNORMAL LOW (ref 8.9–10.3)
Chloride: 103 mmol/L (ref 98–111)
Chloride: 100 mmol/L (ref 98–111)
Creatinine, Ser: 1.72 mg/dL — ABNORMAL HIGH (ref 0.44–1.00)
Creatinine, Ser: 1.43 mg/dL — ABNORMAL HIGH (ref 0.44–1.00)
GFR, Estimated: 31 mL/min — ABNORMAL LOW (ref >60)
GFR, Estimated: 38 mL/min — ABNORMAL LOW (ref >60)
Glucose, Bld: 180 mg/dL — ABNORMAL HIGH (ref 70–99)
Glucose, Bld: 132 mg/dL — ABNORMAL HIGH (ref 70–99)
Phosphorus: 4.9 mg/dL — ABNORMAL HIGH (ref 2.5–4.6)
Phosphorus: 4.4 mg/dL (ref 2.5–4.6)
Potassium: 4.3 mmol/L (ref 3.5–5.1)
Potassium: 4 mmol/L (ref 3.5–5.1)
Sodium: 132 mmol/L — ABNORMAL LOW (ref 135–145)
Sodium: 135 mmol/L (ref 135–145)

## 2021-04-17 LAB — CBC
HCT: 22.4 % — ABNORMAL LOW (ref 36.0–46.0)
Hemoglobin: 7.5 g/dL — ABNORMAL LOW (ref 12.0–15.0)
MCH: 28.7 pg (ref 26.0–34.0)
MCHC: 33.5 g/dL (ref 30.0–36.0)
MCV: 85.8 fL (ref 80.0–100.0)
Platelets: 61 10*3/uL — ABNORMAL LOW (ref 150–400)
RBC: 2.61 MIL/uL — ABNORMAL LOW (ref 3.87–5.11)
RDW: 21.4 % — ABNORMAL HIGH (ref 11.5–15.5)
WBC: 24.3 10*3/uL — ABNORMAL HIGH (ref 4.0–10.5)
nRBC: 0 % (ref 0.0–0.2)

## 2021-04-17 LAB — APTT
aPTT: 92 seconds — ABNORMAL HIGH (ref 24–36)
aPTT: 88 seconds — ABNORMAL HIGH (ref 24–36)

## 2021-04-17 LAB — PHOSPHORUS: Phosphorus: 5.1 mg/dL — ABNORMAL HIGH (ref 2.5–4.6)

## 2021-04-17 LAB — POTASSIUM: Potassium: 4.1 mmol/L (ref 3.5–5.1)

## 2021-04-17 LAB — PROCALCITONIN: Procalcitonin: 4.2 ng/mL

## 2021-04-17 LAB — MAGNESIUM: Magnesium: 2.1 mg/dL (ref 1.7–2.4)

## 2021-04-17 MED ORDER — ARTIFICIAL TEARS OPHTHALMIC OINT
TOPICAL_OINTMENT | OPHTHALMIC | Status: DC | PRN
  Administered 2021-04-17: 1 via OPHTHALMIC
  Filled 2021-04-17: qty 3.5

## 2021-04-17 MED ORDER — PRISMASOL BGK 0/2.5 32-2.5 MEQ/L EC SOLN
Status: DC
  Filled 2021-04-17: qty 5000

## 2021-04-17 MED ORDER — SODIUM CHLORIDE 0.9 % IV SOLN
1.0000 g | Freq: Three times a day (TID) | INTRAVENOUS | Status: DC
  Administered 2021-04-17 – 2021-04-19 (×6): 1 g via INTRAVENOUS
  Filled 2021-04-17 (×8): qty 1

## 2021-04-17 MED ORDER — FLUCONAZOLE IN SODIUM CHLORIDE 400-0.9 MG/200ML-% IV SOLN
400.0000 mg | INTRAVENOUS | Status: DC
  Administered 2021-04-17 – 2021-04-18 (×2): 400 mg via INTRAVENOUS
  Filled 2021-04-17 (×3): qty 200

## 2021-04-17 MED ORDER — CALCIUM GLUCONATE-NACL 1-0.675 GM/50ML-% IV SOLN
1.0000 g | Freq: Once | INTRAVENOUS | Status: AC
  Administered 2021-04-17: 1000 mg via INTRAVENOUS
  Filled 2021-04-17: qty 50

## 2021-04-17 MED ORDER — SODIUM CHLORIDE 0.9 % FOR CRRT
INTRAVENOUS_CENTRAL | Status: DC | PRN
  Filled 2021-04-17: qty 1000

## 2021-04-17 MED ORDER — VASOPRESSIN 20 UNITS/100 ML INFUSION FOR SHOCK
0.0000 [IU]/min | INTRAVENOUS | Status: DC
  Administered 2021-04-18: 0.03 [IU]/min via INTRAVENOUS
  Filled 2021-04-17: qty 100

## 2021-04-17 MED ORDER — RENA-VITE PO TABS
1.0000 | ORAL_TABLET | Freq: Every day | ORAL | Status: DC
  Administered 2021-04-17 – 2021-04-19 (×3): 1
  Filled 2021-04-17 (×3): qty 1

## 2021-04-17 MED ORDER — PIVOT 1.5 CAL PO LIQD
1000.0000 mL | ORAL | Status: DC
  Administered 2021-04-17 – 2021-04-18 (×2): 1000 mL
  Filled 2021-04-17: qty 1000

## 2021-04-17 MED ORDER — FLUCONAZOLE IN SODIUM CHLORIDE 400-0.9 MG/200ML-% IV SOLN
800.0000 mg | INTRAVENOUS | Status: DC
  Filled 2021-04-17: qty 400

## 2021-04-17 NOTE — Progress Notes (Signed)
Patient oozing blood around left IJ trialysis and from lips and tongue.  Patient also having multiple runs of PVC's since start of CRRT.  Dr. Lanney Gins notified.  Argatroban to be discontinued and electrolytes to be checked.

## 2021-04-17 NOTE — Progress Notes (Signed)
Wound nurse at bedside to complete dressing change to colostomy and wound vac to abdomen. Versed 2mg  IV and Fentanyl 50 mcg given prior to dressing change.

## 2021-04-17 NOTE — Consult Note (Addendum)
WOC Nurse Consult Note: Vac dressing changed to abd wound.  Pt medicated for pain prior to the procedure and tolerated without apparent discomfort. Pt is critically ill and intubated. Wound type: Surgical full thickness wound to midline abd; refer to previous notes for measurements. Wound bed: 85% red, 15% yellow adipose tissue Drainage (amount, consistency, odor) small amt serous Periwound:intact Dressing procedure/placement/frequency: one piece of black foam removed from wound, wound cleansed with NS and gently patted dry. One piece of black foam used to obliterate dead space and drape applied. Dressing attached to 152mmHg continuous negative pressure and an immediate seal is achieved. Dressing is labeled.     [place] Nurse ostomy consult note Stoma type/location: left upper quadrant colostomy Stomal assessment/size: oval and slightly below skin level, 1 inch x 1 and 1/4 inch. Red and viable. Peristomal assessment: intact Treatment options for stomal/peristomal skin: skin barrier ring  Output: mod amt small amount of dark brown pasty stool at stoma at pouch change. Ostomy pouching: Applied 1pc. flexible convex pouch.  No family members present for teaching. Extra supplies at bedside for staff nurse use. Enrolled patient in [place] program:No   Supplies at the bedside for staff nurse use.  [place] team will continue to follow for Vac dressings and pouch changes Q M/W/F. Julien Girt MSN, RN, [place], [place], [place]

## 2021-04-17 NOTE — Progress Notes (Signed)
Date of Admission:  05/03/2021     ID: Lindsay [name] is a 75 y.o. female  Active Problems:   Atrial fibrillation with rapid ventricular response (HCC)   Bowel perforation (H[facility])   Peritonitis ([place])   Severe sepsis ([place])   Acute renal failure (ARF) (H[facility])   Lactic acidosis   Volume depletion   Coagulopathy (HCC)   Acute respiratory failure (H[facility])   On mechanically assisted ventilation (HCC)   Acute blood loss anemia   Pressure injury of skin    Subjective: Remains intubated  Medications:   chlorhexidine gluconate (MEDLINE KIT)  15 mL Mouth Rinse BID   Chlorhexidine Gluconate Cloth  6 each Topical Q0600   feeding supplement (PIVOT 1.5 CAL)  1,000 mL Per Tube Q24H   feeding supplement (PROSource TF)  45 mL Per Tube TID   free water  30 mL Per Tube Q4H   insulin aspart  0-9 Units Subcutaneous Q4H   mouth rinse  15 mL Mouth Rinse 10 times per day   multivitamin  15 mL Per Tube Daily   nutrition supplement (JUVEN)  1 packet Per Tube BID BM   pantoprazole (PROTONIX) IV  40 mg Intravenous Daily    Objective: Vital signs in last 24 hours: Temp:  [95.5 F (35.3 C)-101.8 F (38.8 C)] 98 F (36.7 C) (07/13 0800) Pulse Rate:  [42-113] 97 (07/13 1100) Resp:  [16-28] 18 (07/13 1100) BP: (81-162)/(35-89) 90/37 (07/13 1100) SpO2:  [96 %-100 %] 96 % (07/13 1100) Arterial Line BP: (83-220)/(27-77) 107/36 (07/13 1105) FiO2 (%):  [28 %-50 %] 28 % (07/13 1130)  PHYSICAL EXAM:  General: Remains intuabted Back on CRRT  Lungs: b/l air entry. Heart: Tachycarda. Abdomen: wound vac, 2 JP drain, left drain Extremities: edema  Left radial arterial line Neurologic: cannot assess  Lab Results Recent Labs    04/16/21 0501 04/17/21 0150  WBC 22.7* 24.3*  HGB 8.7* 7.5*  HCT 26.4* 22.4*  NA 136 132*  K 4.8 4.3  CL 105 103  CO2 23 24  BUN 76* 72*  CREATININE 1.54* 1.72*   Liver Panel Recent Labs    04/15/21 0454 04/15/21 1101 04/17/21 0150  PROT 4.9* 5.0*  --   ALBUMIN 2.3*   2.3* 2.2* 1.8*  AST 20 22  --   ALT 16 17  --   ALKPHOS 99 105  --   BILITOT 0.9 0.8  --     Microbiology: 04/15/21 Abdominal abscess culture no organism Studies/Results: DG Chest Port 1 View  Result Date: 04/16/2021 CLINICAL DATA:  Follow-up pneumonia.  Ventilator support. EXAM: PORTABLE CHEST 1 VIEW COMPARISON:  04/15/2021 FINDINGS: Endotracheal tube tip 3.5 cm above the carina. Orogastric or nasogastric tube enters the stomach. Right internal jugular central line tip in the SVC probably extends through the right atrium to the upper IVC. Left internal jugular central line tip extends through the right atrium to the upper IVC. Pacemaker/AICD appears unchanged. Persistent infiltrate and volume loss in the mid and lower lungs, left worse than right. No worsening or new finding. IMPRESSION: Persistent infiltrate and volume loss in the mid and lower lungs, left worse than right. Lines and tubes appear similar. Left internal jugular central line and right internal jugular central line extend through the right atrium to the upper IVC. Electronically Signed   By: Nelson Chimes M.D.   On: 04/16/2021 11:11   CT IMAGE GUIDED DRAINAGE BY PERCUTANEOUS CATHETER  Result Date: 04/15/2021 INDICATION: 75 year old female status post perforated colon  status post exploratory laparotomy. Residual abdominal fluid resembling ascites with possible mild loculation seen on CT imaging. Drain placement requested. EXAM: CT-guided drain placement MEDICATIONS: The patient is currently admitted to the hospital and receiving intravenous antibiotics. The antibiotics were administered within an appropriate time frame prior to the initiation of the procedure. ANESTHESIA/SEDATION: None. COMPLICATIONS: None immediate. PROCEDURE: Informed written consent was obtained from the patient after a thorough discussion of the procedural risks, benefits and alternatives. All questions were addressed. Maximal Sterile Barrier Technique was utilized  including caps, mask, sterile gowns, sterile gloves, sterile drape, hand hygiene and skin antiseptic. A timeout was performed prior to the initiation of the procedure. A planning axial CT scan was performed. The fluid layering along the left pericolic gutter was localized. The skin was marked. The overlying skin was then sterilely prepped and draped in standard fashion using chlorhexidine skin prep. Local anesthesia was attained by infiltration with 1% lidocaine. A small dermatotomy was made. Under intermittent CT guidance, an 18 gauge trocar needle was advanced into the fluid. A 0.035 wire was then advanced into the fluid. The needle was removed. The subcutaneous tract was dilated to 10 [place]. A Cook 10 [place] all-purpose drainage catheter was advanced over the wire and formed in the fluid. Aspiration yields mildly turbid serosanguineous ascites. Several 100 cc of fluid was aspirated. Follow-up CT imaging demonstrates a well-positioned drainage catheter in the expected location. The catheter was secured to the skin with 0 Prolene suture and connected to JP bulb suction. IMPRESSION: Successful placement of 10 French drainage catheter. Evacuation fluid is serosanguineous and mildly turbid consistent with ascites. Samples were sent for culture. Electronically Signed   By: Jacqulynn Cadet M.D.   On: 04/15/2021 16:55     Assessment/Plan:  Perforated diverticulitis with multiple intraabdominal abscesses and pneumoperitoneum. Underwent exp lap with hartman procedure Repeat CT done 7/10 showed left-sided collection.  IR has placed a drain and  culture from 04/15/21 NG so far Septic shock and multi organ failure Back On CRRT Leucocytosis persist Currently on meropenem+ flucoanzole Culture from 04/10/21  had klebsiella and suscetible to unasyn- we may switch her to that or zosyn. Day 9 of antibiotic  AKI due to sepsis  Anemia  Discussed with care team

## 2021-04-17 NOTE — Progress Notes (Signed)
Dr. Lanney Gins notified of a rhythm changes and ongoing hypothermia.  12 lead EKG obtained.  Dr. Lanney Gins changed neo synephrine to vasopressin and ordered calcium gluconate. Per Dr. Lanney Gins continue with blanket warmer for hypothermia.

## 2021-04-17 NOTE — Progress Notes (Signed)
Bair hugger blanket warmer applied for rectal temperature of 93.4.

## 2021-04-17 NOTE — Progress Notes (Signed)
Pharmacy Heparin Induced Thrombocytopenia (HIT) Note:  Lindsay [name] is an 75 y.o. female being evaluated for HIT. Patient has not received heparin other than 2 doses intracatheter for CRRT on 7/6 and 7/8. Platelets were 461 early 7/3 morning when patient presented and drifted down to 266 later that same day. Continued to decline since admission. Patient has been on CRRT since 7/4.  HIT labs were ordered on 7/10 when platelets dropped to 44.   CALCULATE SCORE:  4Ts (see the HIT Algorithm) Score  Thrombocytopenia 1  Timing 1  Thrombosis 1  Other causes of thrombocytopenia 0  Total 3 - low probability of HIT    Per surgeon's note, concern for DIC or HIT. With limited exposure to heparin during CRRT, question whether thrombocytopenia likely multifactorial in setting of continued CRRT and possible DIC.   Plan (Discussed with provider) Labs ordered:  HIT antibody 0.935, SRA pending Heparin allergy:  Heparin allergy documented or updated. Anticoagulation plans:  Begin alternative anticoagulation with argatroban per consult from provider   Goal aPTT: 50-90 seconds  7/11 1101 aPTT = 75 7/11 2137 aPTT = 73  7/12 0501 aPTT = 88 7/13 0150 aPTT = 92, supratherapeutic 7/13 0947 aPTT = 88  HIT antibody 0.935, SRA pending  Continue argatroban at 0.4 mcg/kg/min Check aPTT in 4 hours CBC daily while on argatroban; H&H and platelets down-trending today   Benita Gutter 04/17/2021 8:58 AM

## 2021-04-17 NOTE — Progress Notes (Addendum)
[facility] Hospital Day(s): 10.   Post op day(s): 10 Days Post-Op.   Interval History:  Patient seen and examined Remains intubated; sedated this morning but able to follow some commands during the day per CCM She is on 8 mcg Phenylephrine  T-max 101.87F in last 24 hours  Leukocytosis continues to elevated again, now up to 24.3K Hgb trending down; 7.5 (from 8.7); may be some dilutional component  PLT 61; HIT Ab 0.935 Renal function continues to worsen again; sCr - 1.72; UO - 15 ccs On intermittent HD, but with BP issues may need CRRT Hyponatremia to 132 o/w no significant electrolyte derangements Surgical drains             - RUQ: 23 ccs out; serosanguinous             - RLQ: 15 ccs out; serosanguinous Percutaneous drain in left abdomen by IR (07/11); 70ccs out; NGTD on Cx Colostomy retracted, stool in bag; 425 ccs recorded  Wound vac to midline wound; WOC to follow  She is off TPN; only on TF (40 ml/hr) On Meropenem; ID following  Vital signs in last 24 hours: [min-max] current  Temp:  [95.5 F (35.3 C)-101.8 F (38.8 C)] 99.3 F (37.4 C) (07/13 0600) Pulse Rate:  [31-130] 57 (07/13 0600) Resp:  [16-28] 18 (07/13 0600) BP: (81-167)/(35-89) 124/59 (07/13 0600) SpO2:  [91 %-100 %] 100 % (07/13 0600) Arterial Line BP: (100-220)/(27-77) 160/44 (07/13 0600) FiO2 (%):  [40 %-50 %] 40 % (07/13 0135)     Height: 5\' 5"  (165.1 cm) Weight: 81.7 kg BMI (Calculated): 29.97   Intake/Output last 2 shifts:  07/12 0701 - 07/13 0700 In: 6493 [I.V.:4298; NG/GT:1735; IV Piggyback:449.9] Out: 772.5 [Urine:15; Emesis/NG output:125; Drains:207.5; Stool:425]   Physical Exam:  Constitutional: Intubated; Sedated HEENT: NGT in place; tube feedings Respiratory: on ventilator  Cardiovascular: tachycardic; irregular; few PVCs Gastrointestinal: Soft, unable to assess tenderness; non-distended, no rebound/guarding. 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 Newly placed drain in left abdomen; serous  Genitourinary: Foley in place  Integumentary: Midline wound, measuring 21 x 6 x 3 cm, Wound vac in place  Labs:  CBC Latest Ref Rng & Units 04/17/2021 04/16/2021 04/15/2021  WBC 4.0 - 10.5 K/uL 24.3(H) 22.7(H) 18.9(H)  Hemoglobin 12.0 - 15.0 g/dL 7.5(L) 8.7(L) 8.8(L)  Hematocrit 36.0 - 46.0 % 22.4(L) 26.4(L) 26.1(L)  Platelets 150 - 400 K/uL 61(L) 78(L) 52(L)   CMP Latest Ref Rng & Units 04/17/2021 04/16/2021 04/15/2021  Glucose 70 - 99 mg/dL 180(H) 123(H) 175(H)  BUN 8 - 23 mg/dL 72(H) 76(H) 39(H)  Creatinine 0.44 - 1.00 mg/dL 1.72(H) 1.54(H) 0.65  Sodium 135 - 145 mmol/L 132(L) 136 135  Potassium 3.5 - 5.1 mmol/L 4.3 4.8 4.0  Chloride 98 - 111 mmol/L 103 105 104  CO2 22 - 32 mmol/L 24 23 25   Calcium 8.9 - 10.3 mg/dL 7.7(L) 8.1(L) 8.2(L)  Total Protein 6.5 - 8.1 g/dL - - 5.0(L)  Total Bilirubin 0.3 - 1.2 mg/dL - - 0.8  Alkaline Phos 38 - 126 U/L - - 105  AST 15 - 41 U/L - - 22  ALT 0 - 44 U/L - - 17     Imaging studies: No new pertinent imaging studies   Assessment/Plan:  75 y.o. female requiring ventilator support and vasopressor support 10 Days Post-Op s/p exploratory laparotomy, hartman's procedure, salpingo-oopherectomy, and placement of wound vac  for perforated diverticulitis with frankly purulent peritonitis   - Agree with palliative care involvement; difficult situation with questionable return to prior level of function; continue full scope for now; DNR             - Appreciate PCCM assistance; ventilator and vasopressor support pre their service - Appreciate nephrology assistance; intermittent HD -vs- return to CRRT  - Continue tube feedings; tolerating goal; may need to add fiber supplement due to very watery output. - Continue IV Abx (Meropenem); ID following; follow Cx from drain placement on 07/11 - Monitor abdominal examination; on-going  colostomy function              - Midline wound care: Wound vac; MWF schedule --> WOC will take over; appreciate their assistance             - Pain control prn - Monitor leukocytosis; worse this morning - Monitor platelets; HIT Ab slightly elevated             - Monitor renal function; worsening  All of the above findings and recommendations were discussed with the medical team.   -- Edison Simon, PA-C [facility] 04/17/2021, 7:23 AM [phone] M-F: 7am - 4pm   I saw and evaluated the patient.  I agree with the above documentation, exam, and plan, which I have edited where appropriate. Fredirick Maudlin  12:40 PM

## 2021-04-17 NOTE — Progress Notes (Signed)
CRRT started per policy and procedure.  Patient tolerating CVVHDF without complication.

## 2021-04-17 NOTE — Progress Notes (Signed)
Pharmacy Heparin Induced Thrombocytopenia (HIT) Note:  Lindsay [name] is an 75 y.o. female being evaluated for HIT. Patient has not received heparin other than 2 doses intracatheter for CRRT on 7/6 and 7/8. Platelets were 461 early 7/3 morning when patient presented and drifted down to 266 later that same day. Continued to decline since admission. Patient has been on CRRT since 7/4.  HIT labs were ordered on 7/10 when platelets dropped to 44.   CALCULATE SCORE:  4Ts (see the HIT Algorithm) Score  Thrombocytopenia 1  Timing 1  Thrombosis 1  Other causes of thrombocytopenia 0  Total 3 - low probability of HIT    Per surgeon's note, concern for DIC or HIT. With limited exposure to heparin during CRRT, question whether thrombocytopenia likely multifactorial in setting of continued CRRT and possible DIC.   Plan (Discussed with provider) Labs ordered:  HIT antibody 0.935, SRA pending Heparin allergy:  Heparin allergy documented or updated. Anticoagulation plans:  Begin alternative anticoagulation with argatroban per consult from provider   Goal aPTT: 50-90 seconds  7/11 1101 aPTT = 75 7/11 2137 aPTT = 73  7/12 0501 aPTT = 88 7/13 0150 aPTT = 92, supratherapeutic 7/13 0947 aPTT = 88  HIT antibody 0.935, SRA pending  Patient with oozing from LIJ trialysis catheter. Worsening anemia. Hold argatroban for now Continue to monitor for bleeding / CBC   Benita Gutter 04/17/2021 1:51 PM

## 2021-04-17 NOTE — Progress Notes (Signed)
Nutrition Follow-up  DOCUMENTATION CODES:   Not applicable  INTERVENTION:   Continue Pivot 1.5 '@40ml' /hr + Pro-Source 67m TID via tube  Free water flushes 335mq4 hours to maintain tube patency   Regimen provides 1560kcal/day, 123g/day protein and 90846may free water  Juven Fruit Punch BID via tube, each serving provides 95kcal and 2.5g of protein (amino acids glutamine and arginine)  Liquid MVI daily via tube   Rena-vit daily via tube   NUTRITION DIAGNOSIS:   Increased nutrient needs related to wound healing as evidenced by estimated needs.  GOAL:   Provide needs based on ASPEN/SCCM guidelines -Met with tube feeds   MONITOR:   Vent status, Labs, Weight trends, Skin, I & O's, Other (Comment) (TPN)  ASSESSMENT:   74 11[age] female with h/o Afib, GERD, ICD, DM and hypertension who is admitted with perforated diverticulitis now s/p exploratory laparotomy, hartman's procedure, salpingo-oopherectomy and placement of wound vac for purulent peritonitis on 7/3.  Pt remains sedated and ventilated. TPN discontinued. Pt tolerating tube feeds at goal rate. Refeed labs stable. Pt restarted on CRRT. Per chart, pt is up ~27lbs from her UBW. Pt +21.0L on her I & Os.   Medications reviewed and include: insulin, MVI, juven, protonix, precedex, fentanyl, diflucan, meropenem, phenylephrine  Labs reviewed: K 4.1 wnl, P 5.1(H), Mg 2.1 wnl Wbc- 24.3(H), Hgb 7.5(L), Hct 22.4(L) Cbgs- 143, 183, 192 x 24 hrs  Patient is currently intubated on ventilator support MV: 10.1 L/min Temp (24hrs), Avg:97.6 F (36.4 C), Min:95.5 F (35.3 C), Max:101.8 F (38.8 C)  Propofol: none   MAP- >39m15m UOP- 15ml26mains- 207ml 67met Order:   Diet Order             Diet NPO time specified  Diet effective now                  EDUCATION NEEDS:   No education needs have been identified at this time  Skin: Reviewed RN Assessment (midline wound- 21cm x 6cm x 3cm), Wound VAC  Last BM:  7/7-  via ostomy  Height:   Ht Readings from Last 1 Encounters:  04/11/21 '5\' 5"'  (1.651 m)    Weight:   Wt Readings from Last 1 Encounters:  04/14/21 81.7 kg    Ideal Body Weight:  56.8 kg  BMI:  Body mass index is 29.97 kg/m.  Estimated Nutritional Needs:   Kcal:  1729kcal/day  Protein:  110-125g/day  Fluid:  1.4-1.7L/day  Rozlyn Yerby Koleen DistanceD, LDN Please refer to [facility] and/or RD on-call/weekend/after hours pager

## 2021-04-17 NOTE — Progress Notes (Signed)
[facility]  ROUNDING NOTE   Subjective:   Hemodialysis treatment yesterday. Required phenylephrine.   UOP 60m.   Tmax 101.8 Wbc 24.3 (22.7)  Objective:  Vital signs in last 24 hours:  Temp:  [95.5 F (35.3 C)-101.8 F (38.8 C)] 99.3 F (37.4 C) (07/13 0600) Pulse Rate:  [31-116] 57 (07/13 0600) Resp:  [16-28] 18 (07/13 0600) BP: (81-162)/(35-89) 124/59 (07/13 0600) SpO2:  [91 %-100 %] 99 % (07/13 0748) Arterial Line BP: (100-220)/(27-77) 160/44 (07/13 0600) FiO2 (%):  [28 %-50 %] 28 % (07/13 0748)  Weight change:  Filed Weights   04/12/21 0318 04/13/21 0500 04/14/21 0417  Weight: 79.1 kg 81.7 kg 81.7 kg    Intake/Output: I/O last 3 completed shifts: In: 687[I.V.:4298; Other:10; NG/GT:1735; IV Piggyback:549.9] Out: 872.5 [Urine:40; Emesis/NG output:125; Drains:282.5; Stool:425]   Intake/Output this shift:  No intake/output data recorded.  Physical Exam: General: Critically ill  Head: ETT  Eyes: Anicteric  Neck: Supple  Lungs:  PRVC fiO2 28%  Heart: irregular  Abdomen:  +midline incision, +JP drains  Extremities: + dependent edema and in extremities  Neurologic: Intubated, sedated  Skin: No acute rash  Access: Left IJ temporary dialysis catheter    Basic Metabolic Panel: Recent Labs  Lab 04/13/21 0432 04/13/21 1640 04/14/21 0450 04/14/21 1344 04/15/21 0454 04/15/21 1101 04/16/21 0501 04/17/21 0150  NA 130*   < > 133* 134* 136  135 135 136 132*  K 4.6   < > 4.2 4.0 4.0  4.0 4.0 4.8 4.3  CL 104   < > 104 103 105  105 104 105 103  CO2 24   < > _0 GLUCOSE 204*   < > 199* 194* 185*  184* 175* 123* 180*  BUN 21   < > 27* 28* 32*  31* 39* 76* 72*  CREATININE 0.83   < > 0.59 0.54 0.54  0.47 0.65 1.54* 1.72*  CALCIUM 7.5*   < > 7.8* 8.1* 8.1*  8.0* 8.2* 8.1* 7.7*  MG 2.3  --  2.4  --  2.4 2.4 2.5*  --   PHOS 2.9  2.9   < > 2.2*  2.3* 2.4* 2.7  2.7 2.8 4.4 4.9*   < > = values in this interval not displayed.      Liver Function Tests: Recent Labs  Lab 04/11/21 0404 04/11/21 1616 04/12/21 0536 04/12/21 1704 04/14/21 0450 04/14/21 1344 04/15/21 0454 04/15/21 1101 04/17/21 0150  AST 16  --  14*  --   --   --  20 22  --   ALT 19  --  14  --   --   --  16 17  --   ALKPHOS 63  --  69  --   --   --  99 105  --   BILITOT 0.7  --  0.7  --   --   --  0.9 0.8  --   PROT 4.5*  --  4.2*  --   --   --  4.9* 5.0*  --   ALBUMIN 1.7*  1.8*   < > 1.7*   < > 2.1* 2.5* 2.3*  2.3* 2.2* 1.8*   < > = values in this interval not displayed.    No results for input(s): LIPASE, AMYLASE in the last 168 hours.  No results for input(s): AMMONIA in the last 168 hours.  CBC: Recent Labs  Lab 04/12/21 0403 04/13/21 07846  04/14/21 0450 04/14/21 1344 04/15/21 0454 04/15/21 1101 04/16/21 0501 04/17/21 0150  WBC 46.8*   < > 23.4*  --  14.4* 18.9* 22.7* 24.3*  NEUTROABS 42.6*  --   --   --  13.1*  --   --   --   HGB 9.2*   < > 6.8* 7.8* 8.3* 8.8* 8.7* 7.5*  HCT 28.5*   < > 20.3* 23.4* 24.0* 26.1* 26.4* 22.4*  MCV 86.4   < > 84.2  --  83.6 83.9 87.1 85.8  PLT 72*   < > 44*  --  41* 52* 78* 61*   < > = values in this interval not displayed.     Cardiac Enzymes: No results for input(s): CKTOTAL, CKMB, CKMBINDEX, TROPONINI in the last 168 hours.  BNP: Invalid input(s): POCBNP  CBG: Recent Labs  Lab 04/16/21 1551 04/16/21 1932 04/16/21 2309 04/17/21 0325 04/17/21 0749  GLUCAP 96 136* 138* 143* 183*     Microbiology: Results for orders placed or performed during the hospital encounter of 04/27/2021  Resp Panel by RT-PCR (Flu A&B, Covid) Nasopharyngeal Swab     Status: None   Collection Time: 05/03/2021  4:40 AM   Specimen: Nasopharyngeal Swab; Nasopharyngeal(NP) swabs in vial transport medium  Result Value Ref Range Status   SARS Coronavirus 2 by RT PCR NEGATIVE NEGATIVE Final    Comment: (NOTE) SARS-CoV-2 target nucleic acids are NOT DETECTED.  The SARS-CoV-2 RNA is generally detectable in  upper respiratory specimens during the acute phase of infection. The lowest concentration of SARS-CoV-2 viral copies this assay can detect is 138 copies/mL. A negative result does not preclude SARS-Cov-2 infection and should not be used as the sole basis for treatment or other patient management decisions. A negative result may occur with  improper specimen collection/handling, submission of specimen other than nasopharyngeal swab, presence of viral mutation(s) within the areas targeted by this assay, and inadequate number of viral copies(<138 copies/mL). A negative result must be combined with clinical observations, patient history, and epidemiological information. The expected result is Negative.  Fact Sheet for Patients:  EntrepreneurPulse.com.au  Fact Sheet for Healthcare Providers:  IncredibleEmployment.be  This test is no t yet approved or cleared by the [place] FDA and  has been authorized for detection and/or diagnosis of SARS-CoV-2 by FDA under an Emergency Use Authorization (EUA). This EUA will remain  in effect (meaning this test can be used) for the duration of the COVID-19 declaration under Section 564(b)(1) of the Act, 21 U.S.C.section 360bbb-3(b)(1), unless the authorization is terminated  or revoked sooner.       Influenza A by PCR NEGATIVE NEGATIVE Final   Influenza B by PCR NEGATIVE NEGATIVE Final    Comment: (NOTE) The Xpert Xpress SARS-CoV-2/FLU/RSV plus assay is intended as an aid in the diagnosis of influenza from Nasopharyngeal swab specimens and should not be used as a sole basis for treatment. Nasal washings and aspirates are unacceptable for Xpert Xpress SARS-CoV-2/FLU/RSV testing.  Fact Sheet for Patients: EntrepreneurPulse.com.au  Fact Sheet for Healthcare Providers: IncredibleEmployment.be  This test is not yet approved or cleared by the [place] FDA and has been  authorized for detection and/or diagnosis of SARS-CoV-2 by FDA under an Emergency Use Authorization (EUA). This EUA will remain in effect (meaning this test can be used) for the duration of the COVID-19 declaration under Section 564(b)(1) of the Act, 21 U.S.C. section 360bbb-3(b)(1), unless the authorization is terminated or revoked.  Performed at [facility], [place] [address] [place], [place] [address]   Blood culture (routine x 2)     Status: None   Collection Time: 04/13/2021  5:33 AM   Specimen: BLOOD  Result Value Ref Range Status   Specimen Description BLOOD LEFT ANTECUBITAL  Final   Special Requests   Final    BOTTLES DRAWN AEROBIC AND ANAEROBIC Blood Culture adequate volume   Culture   Final    NO GROWTH 5 DAYS Performed at [facility], [address] [place], [place] [address]    Report Status 04/12/2021 FINAL  Final  Blood culture (routine x 2)     Status: None   Collection Time: 05/02/2021  7:40 AM   Specimen: BLOOD  Result Value Ref Range Status   Specimen Description BLOOD BLOOD RIGHT WRIST  Final   Special Requests   Final    BOTTLES DRAWN AEROBIC AND ANAEROBIC Blood Culture adequate volume   Culture   Final    NO GROWTH 5 DAYS Performed at [facility], [address], [place], [place] [address]    Report Status 04/12/2021 FINAL  Final  MRSA Next Gen by PCR, Nasal     Status: None   Collection Time: 04/21/2021 12:50 PM   Specimen: Nasal Mucosa; Nasal Swab  Result Value Ref Range Status   MRSA by PCR Next Gen NOT DETECTED NOT DETECTED Final    Comment: (NOTE) The GeneXpert MRSA Assay (FDA approved for NASAL specimens only), is one component of a comprehensive MRSA colonization surveillance program. It is not intended to diagnose MRSA infection nor to guide or monitor treatment for MRSA infections. Test performance is not FDA approved in patients less than 69 years old. Performed at [facility], [address]  [place], [place] [address]   Body fluid culture w Gram Stain     Status: None   Collection Time: 04/10/21  4:30 PM   Specimen: JP Drain; Body Fluid  Result Value Ref Range Status   Specimen Description   Final    JP DRAINAGE Performed at [facility], [address] [place], [place] [address]    Special Requests   Final    Normal Performed at [facility], [address] [place], [place] [address]    Gram Stain   Final    ABUNDANT WBC PRESENT, PREDOMINANTLY PMN NO ORGANISMS SEEN    Culture   Final    RARE KLEBSIELLA PNEUMONIAE CRITICAL RESULT CALLED TO, READ BACK BY AND VERIFIED WITH: RN E.JENON AT 0945 ON 04/11/2021 BY T.SAAD. Performed at [facility], [place] [address], [place],  [address]    Report Status 04/14/2021 FINAL  Final   Organism ID, Bacteria KLEBSIELLA PNEUMONIAE  Final      Susceptibility   Klebsiella pneumoniae - MIC*    AMPICILLIN RESISTANT Resistant     CEFAZOLIN <=4 SENSITIVE Sensitive     CEFEPIME <=0.12 SENSITIVE Sensitive     CEFTAZIDIME <=1 SENSITIVE Sensitive     CEFTRIAXONE <=0.25 SENSITIVE Sensitive     CIPROFLOXACIN <=0.25 SENSITIVE Sensitive     GENTAMICIN <=1 SENSITIVE Sensitive     IMIPENEM <=0.25 SENSITIVE Sensitive     TRIMETH/SULFA <=20 SENSITIVE Sensitive     AMPICILLIN/SULBACTAM <=2 SENSITIVE Sensitive     PIP/TAZO <=4 SENSITIVE Sensitive     * RARE KLEBSIELLA PNEUMONIAE  Aerobic/Anaerobic Culture w Gram Stain (surgical/deep wound)     Status: None (Preliminary result)   Collection Time: 04/15/21  4:21 PM  Specimen: Abdomen; Abdominal Fluid  Result Value Ref Range Status   Specimen Description   Final    ABDOMEN Performed at [facility], [address] [place], [place] [address]    Special Requests   Final    Normal Performed at [facility], [address] [place], [place] [address]    Gram Stain   Final    RARE WBC PRESENT,BOTH PMN AND MONONUCLEAR NO ORGANISMS SEEN Performed  at [facility], [place] [address], [place], [place] [address]    Culture PENDING  Incomplete   Report Status PENDING  Incomplete    Coagulation Studies: Recent Labs    04/14/21 1344 04/16/21 0501  LABPROT 16.3* 39.8*  INR 1.3* 4.1*     Urinalysis: No results for input(s): COLORURINE, LABSPEC, PHURINE, GLUCOSEU, HGBUR, BILIRUBINUR, KETONESUR, PROTEINUR, UROBILINOGEN, NITRITE, LEUKOCYTESUR in the last 72 hours.  Invalid input(s): APPERANCEUR     Imaging: DG Chest Port 1 View  Result Date: 04/16/2021 CLINICAL DATA:  Follow-up pneumonia.  Ventilator support. EXAM: PORTABLE CHEST 1 VIEW COMPARISON:  04/15/2021 FINDINGS: Endotracheal tube tip 3.5 cm above the carina. Orogastric or nasogastric tube enters the stomach. Right internal jugular central line tip in the SVC probably extends through the right atrium to the upper IVC. Left internal jugular central line tip extends through the right atrium to the upper IVC. Pacemaker/AICD appears unchanged. Persistent infiltrate and volume loss in the mid and lower lungs, left worse than right. No worsening or new finding. IMPRESSION: Persistent infiltrate and volume loss in the mid and lower lungs, left worse than right. Lines and tubes appear similar. Left internal jugular central line and right internal jugular central line extend through the right atrium to the upper IVC. Electronically Signed   By: Nelson Chimes M.D.   On: 04/16/2021 11:11   DG Chest Port 1 View  Result Date: 04/15/2021 CLINICAL DATA:  ETT placement. EXAM: PORTABLE CHEST 1 VIEW COMPARISON:  04/08/2021 FINDINGS: 1124 hours. Endotracheal tube tip is 4.8 cm above the base of the carina. The NG tube passes into the stomach although the distal tip position is not included on the film. Left IJ central line tip overlies the lower right atrium given leftward patient rotation. Right IJ central line tip also noted region of lower right atrium. Interval progression of vascular congestion  with worsening bibasilar atelectasis or infiltrate. Small bilateral pleural effusions noted. Surgical drain identified left upper abdomen. IMPRESSION: 1. Interval progression of vascular congestion with worsening bibasilar atelectasis or infiltrate. 2. Small bilateral pleural effusions. 3. Stable support apparatus. Electronically Signed   By: Misty Stanley M.D.   On: 04/15/2021 11:46   CT IMAGE GUIDED DRAINAGE BY PERCUTANEOUS CATHETER  Result Date: 04/15/2021 INDICATION: 75 year old female status post perforated colon status post exploratory laparotomy. Residual abdominal fluid resembling ascites with possible mild loculation seen on CT imaging. Drain placement requested. EXAM: CT-guided drain placement MEDICATIONS: The patient is currently admitted to the hospital and receiving intravenous antibiotics. The antibiotics were administered within an appropriate time frame prior to the initiation of the procedure. ANESTHESIA/SEDATION: None. COMPLICATIONS: None immediate. PROCEDURE: Informed written consent was obtained from the patient after a thorough discussion of the procedural risks, benefits and alternatives. All questions were addressed. Maximal Sterile Barrier Technique was utilized including caps, mask, sterile gowns, sterile gloves, sterile drape, hand hygiene and skin antiseptic. A timeout was performed prior to the initiation of the procedure. A planning axial CT scan was performed. The fluid layering along  the left pericolic gutter was localized. The skin was marked. The overlying skin was then sterilely prepped and draped in standard fashion using chlorhexidine skin prep. Local anesthesia was attained by infiltration with 1% lidocaine. A small dermatotomy was made. Under intermittent CT guidance, an 18 gauge trocar needle was advanced into the fluid. A 0.035 wire was then advanced into the fluid. The needle was removed. The subcutaneous tract was dilated to 10 [place]. A Cook 10 [place] all-purpose  drainage catheter was advanced over the wire and formed in the fluid. Aspiration yields mildly turbid serosanguineous ascites. Several 100 cc of fluid was aspirated. Follow-up CT imaging demonstrates a well-positioned drainage catheter in the expected location. The catheter was secured to the skin with 0 Prolene suture and connected to JP bulb suction. IMPRESSION: Successful placement of 10 French drainage catheter. Evacuation fluid is serosanguineous and mildly turbid consistent with ascites. Samples were sent for culture. Electronically Signed   By: Jacqulynn Cadet M.D.   On: 04/15/2021 16:55     Medications:    amiodarone Stopped (04/16/21 1719)   argatroban 0.4 mcg/kg/min (04/17/21 0600)   dexmedetomidine (PRECEDEX) IV infusion 1.2 mcg/kg/hr (04/17/21 0600)   fentaNYL infusion INTRAVENOUS 350 mcg/hr (04/17/21 0600)   fluconazole (DIFLUCAN) IV Stopped (04/16/21 1842)   meropenem (MERREM) IV Stopped (04/16/21 1737)   phenylephrine (NEO-SYNEPHRINE) Adult infusion 10 mcg/min (04/17/21 0600)    chlorhexidine gluconate (MEDLINE KIT)  15 mL Mouth Rinse BID   Chlorhexidine Gluconate Cloth  6 each Topical Q0600   feeding supplement (PIVOT 1.5 CAL)  1,000 mL Per Tube Q24H   feeding supplement (PROSource TF)  45 mL Per Tube TID   free water  30 mL Per Tube Q4H   insulin aspart  0-9 Units Subcutaneous Q4H   mouth rinse  15 mL Mouth Rinse 10 times per day   multivitamin  15 mL Per Tube Daily   nutrition supplement (JUVEN)  1 packet Per Tube BID BM   pantoprazole (PROTONIX) IV  40 mg Intravenous Daily   acetaminophen **OR** acetaminophen, fentaNYL, midazolam, polyvinyl alcohol  Assessment/ Plan:   Ms. Lindsay [name] is a 75 y.o. white female with hypertrophic cardiomyopathy, ventricular tachycardia status post AICD placement, atrial fibrillation on rivaroxaban, diabetes mellitus type II, hypertension, history of diverticulitis, osteopenia, hyperlipidemia, GERD who was admitted to [facility] on 05/03/2021  for Hyponatremia [E87.1] Bowel perforation ([place]) [K63.1] Acute renal failure, unspecified acute renal failure type ([place]) [N17.9] She was found to have bowel perforation with significant pneumoperitoneum. ExLap by Dr. Celine Ahr on 7/3 .    1.  Acute kidney injury with baseline normal creatinine with Metabolic acidosis and hyponatremia.-  Anuric. No signs of renal recovery.  - restart CRRT: continuous hemodialfiltration. Orders prepared.   2. Bowel perforation with purulent peritoneum. Status post ExLap. Now with wound vac. Klebsiella pneumoniae. Appreciate surgery and infectious disease input.   3.  Acute respiratory failure: requiring intubation and mechanical ventilation.  Appreciate critical care input.   4. Septic shock with hypotension. Requiring vasopressors. Currently on phenylephrine. Off stress dose steroids Empiric meropenem and fluconazole.   5. Anemia with renal failure: status post transfusions. Hemoglobin 7.5  6. 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 Placed on argatroban gtt.  - Consider hematology consultation     LOS: [place] 7/13/20228:46 AM

## 2021-04-17 NOTE — Progress Notes (Signed)
Pharmacy Heparin Induced Thrombocytopenia (HIT) Note:  Lindsay [name] is an 75 y.o. female being evaluated for HIT. Patient has not received heparin other than 2 doses intracatheter for CRRT on 7/6 and 7/8. Platelets were 461 early 7/3 morning when patient presented and drifted down to 266 later that same day. Continued to decline since admission. Patient has been on CRRT since 7/4.  HIT labs were ordered on 7/10 when platelets dropped to 44.   CALCULATE SCORE:  4Ts (see the HIT Algorithm) Score  Thrombocytopenia 1  Timing 1  Thrombosis 1  Other causes of thrombocytopenia 0  Total 3 - low probability of HIT    Per surgeon's note, concern for DIC or HIT. With limited exposure to heparin during CRRT, question whether thrombocytopenia likely multifactorial in setting of continued CRRT and possible DIC.   Plan (Discussed with provider) Labs ordered:  HIT antibody 0.935, SRA pending Heparin allergy:  Heparin allergy documented or updated. Anticoagulation plans:  Begin alternative anticoagulation with argatroban per consult from provider   Goal aPTT: 50-90 seconds  7/11 1101 aPTT = 75 7/11 2137 aPTT = 73  7/12 0501 aPTT = 88 7/13 0150 aPTT = 92, supratherapeutic  HIT antibody 0.935, SRA pending  Decrease argatroban rate to 0.4 mcg/kg/min. Check aPTT in 4 hours after rate change. CBC daily while on argatroban.   Lonell Face, PharmD 04/17/2021 3:08 AM

## 2021-04-17 NOTE — Progress Notes (Signed)
Pharmacy Antibiotic Note  Lindsay [name] is a 75 y.o. female w/ PMH of HTN, DM, CHF, atrial fibrillation, diverticular disease, sigmoid stricture admitted on 04/10/2021 post emergent ex lap for perforated viscus, peritonitis, severe sepsis. Pharmacy has been consulted for meropenem and fluconazole dosing (previously on anidulafungin).  Today, 04/17/2021 Day # 11 antibacterial and antifungal coverage IR placed abdominal drain into fluid collection 7/11 WBC 24.3 (trending up) Febrile yesterday, now hypothermic Renal - transitioned off CRRT >> HD. CRRT re-started 7/13 7/12 Tracheal aspirate: pending 7/11 Abdominal drain: pending 7/6 JP drain = K pneumoniae (Amp R) 7/3 Blood cx: NG-F Remains VDRF and back on pressors but weaning ID consulted 7/7  Plan: Adjust fluconazole to 800 mg IV q24h Monitor LFTs / DDIs / avoid QT prolonging agents (amiodarone stopped 7/13) Adjust meropenem to 1 g IV q8h  Continue to follow cultures. Continue to follow RRT plan per nephrology and adjust antimicrobial dosing as indicated.  Height: 5\' 5"  (165.1 cm) Weight: 81.7 kg (180 lb 1.9 oz) IBW/kg (Calculated) : 57  Temp (24hrs), Avg:99 F (37.2 C), Min:95.5 F (35.3 C), Max:101.8 F (38.8 C)  Recent Labs  Lab 04/14/21 0450 04/14/21 1344 04/15/21 0454 04/15/21 1101 04/16/21 0501 04/17/21 0150  WBC 23.4*  --  14.4* 18.9* 22.7* 24.3*  CREATININE 0.59 0.54 0.54  0.47 0.65 1.54* 1.72*  LATICACIDVEN  --   --   --   --  1.1  --      Estimated Creatinine Clearance: 29.8 mL/min (A) (by C-G formula based on SCr of 1.72 mg/dL (H)).    Allergies  Allergen Reactions   Amiodarone     Other reaction(s): Other (See Comments) Nausea, diarrhea, weight loss, anxiety, jitteriness   Levofloxacin Rash    Red palms, lips, forearms    Meloxicam Shortness Of Breath    Other reaction(s): Respiratory Distress   Amlodipine     Other reaction(s): Other (See Comments) Caused severe gingival hyperplasia   Amlodipine  Besy-Benazepril Hcl     Severe gingival hyperplasia  Other reaction(s): Other (See Comments) Other reaction(s): Other   Beta Adrenergic Blockers     States severe fatigue  Other reaction(s): Other (See Comments) fatigue   Heparin     HIT workup pending    Antimicrobials this admission: Anidulafungin 7/3 >> 7/7 Fluconazole 7/8 >> Zosyn 7/3 >> 7/8 Meropenem 7/8 >>  Microbiology results: 0712 Tracheal aspirate: pending 07/11 Abdominal drain: pending 07/06 JP drain: K pneumo (Amp R) 07/03 BCx: NG - final 07/03 MRSA PCR: negative 07/03 SARS CoV-2: negative 07/03 influenza A/B: negative  Thank you for allowing pharmacy to be a part of this patient's care.  Benita Gutter  04/17/2021 8:59 AM

## 2021-04-18 DIAGNOSIS — T8189XA Other complications of procedures, not elsewhere classified, initial encounter: Secondary | ICD-10-CM

## 2021-04-18 DIAGNOSIS — J9601 Acute respiratory failure with hypoxia: Secondary | ICD-10-CM | POA: Diagnosis not present

## 2021-04-18 DIAGNOSIS — I97191 Other postprocedural cardiac functional disturbances following other surgery: Secondary | ICD-10-CM

## 2021-04-18 DIAGNOSIS — D6489 Other specified anemias: Secondary | ICD-10-CM

## 2021-04-18 DIAGNOSIS — D72829 Elevated white blood cell count, unspecified: Secondary | ICD-10-CM

## 2021-04-18 DIAGNOSIS — Z66 Do not resuscitate: Secondary | ICD-10-CM

## 2021-04-18 DIAGNOSIS — N990 Postprocedural (acute) (chronic) kidney failure: Secondary | ICD-10-CM

## 2021-04-18 DIAGNOSIS — R000 Tachycardia, unspecified: Secondary | ICD-10-CM

## 2021-04-18 DIAGNOSIS — Z515 Encounter for palliative care: Secondary | ICD-10-CM

## 2021-04-18 LAB — RENAL FUNCTION PANEL
Albumin: 1.9 g/dL — ABNORMAL LOW (ref 3.5–5.0)
Albumin: 2.7 g/dL — ABNORMAL LOW (ref 3.5–5.0)
Anion gap: 6 (ref 5–15)
Anion gap: 9 (ref 5–15)
BUN: 46 mg/dL — ABNORMAL HIGH (ref 8–23)
BUN: 35 mg/dL — ABNORMAL HIGH (ref 8–23)
CO2: 25 mmol/L (ref 22–32)
CO2: 26 mmol/L (ref 22–32)
Calcium: 8.8 mg/dL — ABNORMAL LOW (ref 8.9–10.3)
Calcium: 9.7 mg/dL (ref 8.9–10.3)
Chloride: 104 mmol/L (ref 98–111)
Chloride: 101 mmol/L (ref 98–111)
Creatinine, Ser: 0.85 mg/dL (ref 0.44–1.00)
Creatinine, Ser: 0.66 mg/dL (ref 0.44–1.00)
GFR, Estimated: >60 mL/min (ref >60)
GFR, Estimated: >60 mL/min (ref >60)
Glucose, Bld: 147 mg/dL — ABNORMAL HIGH (ref 70–99)
Glucose, Bld: 127 mg/dL — ABNORMAL HIGH (ref 70–99)
Phosphorus: 3.2 mg/dL (ref 2.5–4.6)
Phosphorus: 2.8 mg/dL (ref 2.5–4.6)
Potassium: 3.7 mmol/L (ref 3.5–5.1)
Potassium: 3.1 mmol/L — ABNORMAL LOW (ref 3.5–5.1)
Sodium: 135 mmol/L (ref 135–145)
Sodium: 136 mmol/L (ref 135–145)

## 2021-04-18 LAB — PROCALCITONIN: Procalcitonin: 2.16 ng/mL

## 2021-04-18 LAB — SEROTONIN RELEASE ASSAY (SRA)
SRA .2 IU/mL UFH Ser-aCnc: <1 % (ref 0–20)
SRA 100IU/mL UFH Ser-aCnc: <1 % (ref 0–20)

## 2021-04-18 LAB — GLUCOSE, CAPILLARY
Glucose-Capillary: 97 mg/dL (ref 70–99)
Glucose-Capillary: 112 mg/dL — ABNORMAL HIGH (ref 70–99)
Glucose-Capillary: 101 mg/dL — ABNORMAL HIGH (ref 70–99)
Glucose-Capillary: 82 mg/dL (ref 70–99)
Glucose-Capillary: 72 mg/dL (ref 70–99)
Glucose-Capillary: 45 mg/dL — ABNORMAL LOW (ref 70–99)

## 2021-04-18 LAB — CBC
HCT: 24.2 % — ABNORMAL LOW (ref 36.0–46.0)
Hemoglobin: 8 g/dL — ABNORMAL LOW (ref 12.0–15.0)
MCH: 28.3 pg (ref 26.0–34.0)
MCHC: 33.1 g/dL (ref 30.0–36.0)
MCV: 85.5 fL (ref 80.0–100.0)
Platelets: 55 10*3/uL — ABNORMAL LOW (ref 150–400)
RBC: 2.83 MIL/uL — ABNORMAL LOW (ref 3.87–5.11)
RDW: 21.8 % — ABNORMAL HIGH (ref 11.5–15.5)
WBC: 10.6 10*3/uL — ABNORMAL HIGH (ref 4.0–10.5)
nRBC: 0 % (ref 0.0–0.2)

## 2021-04-18 LAB — MAGNESIUM: Magnesium: 1.9 mg/dL (ref 1.7–2.4)

## 2021-04-18 LAB — FIBRINOGEN: Fibrinogen: 606 mg/dL — ABNORMAL HIGH (ref 210–475)

## 2021-04-18 MED ORDER — DEXTROSE 50 % IV SOLN: 25.0000 mL | Freq: Once | INTRAVENOUS | Status: DC

## 2021-04-18 MED ORDER — ALBUMIN HUMAN 25 % IV SOLN
25.0000 g | Freq: Every day | INTRAVENOUS | Status: AC
  Administered 2021-04-18 – 2021-04-20 (×3): 25 g via INTRAVENOUS
  Filled 2021-04-18 (×3): qty 100

## 2021-04-18 MED ORDER — LACTATED RINGERS IV BOLUS
1000.0000 mL | Freq: Once | INTRAVENOUS | Status: AC
  Administered 2021-04-18: 1000 mL via INTRAVENOUS

## 2021-04-18 MED ORDER — DEXTROSE 50 % IV SOLN
INTRAVENOUS | Status: AC
  Filled 2021-04-18: qty 50

## 2021-04-18 MED ORDER — METOPROLOL TARTRATE 5 MG/5ML IV SOLN
5.0000 mg | INTRAVENOUS | Status: DC | PRN
  Administered 2021-04-18 (×2): 5 mg via INTRAVENOUS
  Filled 2021-04-18 (×2): qty 5

## 2021-04-18 MED ORDER — DEXTROSE 5 % IV SOLN: INTRAVENOUS | Status: DC

## 2021-04-18 MED ORDER — DEXTROSE 50 % IV SOLN
50.0000 mL | Freq: Once | INTRAVENOUS | Status: AC
  Administered 2021-04-18: 50 mL via INTRAVENOUS

## 2021-04-18 NOTE — Progress Notes (Signed)
[facility]  ROUNDING NOTE   Subjective:   Restarted CRRT Tmin 91.2  Phenylephrine and vasopressine gtt. Blood pressure readings fluctuating.   Family from [place] coming in tomorrow. Anticipating transition to comfort care tomorrow.   Objective:  Vital signs in last 24 hours:  Temp:  [91.22 F (32.9 C)-97 F (36.1 C)] 91.76 F (33.2 C) (07/14 0827) Pulse Rate:  [67-113] 67 (07/14 0800) Resp:  [16-32] 20 (07/14 0827) BP: (87-178)/(37-82) 171/51 (07/14 0800) SpO2:  [96 %-100 %] 100 % (07/14 0800) Arterial Line BP: (83-232)/(32-74) 126/39 (07/14 0827) FiO2 (%):  [25 %-28 %] 25 % (07/14 0759) Weight:  [82 kg] 82 kg (07/14 0331)  Weight change:  Filed Weights   04/13/21 0500 04/14/21 0417 04/18/21 0331  Weight: 81.7 kg 81.7 kg 82 kg    Intake/Output: I/O last 3 completed shifts: In: 4543.7 [I.V.:2447.1; Other:10; RS/WN:4627; IV Piggyback:742.6] Out: 3833.5 [Urine:76; Drains:372.5; OJJKK:9381; Stool:905]   Intake/Output this shift:  Total I/O In: 187.1 [I.V.:117.1; NG/GT:70] Out: 91 [Other:91]  Physical Exam: General: Critically ill  Head: ETT  Eyes: Anicteric  Neck: Supple  Lungs:  PRVC fiO2 25%  Heart: irregular  Abdomen:  +midline incision, +JP drains  Extremities: + dependent edema and in extremities  Neurologic: Intubated, sedated  Skin: No acute rash  Access: Left IJ temporary dialysis catheter    Basic Metabolic Panel: Recent Labs  Lab 04/15/21 0454 04/15/21 1101 04/16/21 0501 04/17/21 0150 04/17/21 1245 04/17/21 1551 04/18/21 0338  NA 136  135 135 136 132*  --  135 135  K 4.0  4.0 4.0 4.8 4.3 4.1 4.0 3.7  CL 105  105 104 105 103  --  100 104  CO2 _0 --  24 25  GLUCOSE 185*  184* 175* 123* 180*  --  132* 147*  BUN 32*  31* 39* 76* 72*  --  69* 46*  CREATININE 0.54  0.47 0.65 1.54* 1.72*  --  1.43* 0.85  CALCIUM 8.1*  8.0* 8.2* 8.1* 7.7*  --  8.6* 8.8*  MG 2.4 2.4 2.5*  --  2.1  --  1.9  PHOS 2.7   2.7 2.8 4.4 4.9* 5.1* 4.4 3.2     Liver Function Tests: Recent Labs  Lab 04/12/21 0536 04/12/21 1704 04/15/21 0454 04/15/21 1101 04/17/21 0150 04/17/21 1551 04/18/21 0338  AST 14*  --  20 22  --   --   --   ALT 14  --  16 17  --   --   --   ALKPHOS 69  --  99 105  --   --   --   BILITOT 0.7  --  0.9 0.8  --   --   --   PROT 4.2*  --  4.9* 5.0*  --   --   --   ALBUMIN 1.7*   < > 2.3*  2.3* 2.2* 1.8* 2.0* 1.9*   < > = values in this interval not displayed.    No results for input(s): LIPASE, AMYLASE in the last 168 hours.  No results for input(s): AMMONIA in the last 168 hours.  CBC: Recent Labs  Lab 04/12/21 0403 04/13/21 0432 04/15/21 0454 04/15/21 1101 04/16/21 0501 04/17/21 0150 04/18/21 0338  WBC 46.8*   < > 14.4* 18.9* 22.7* 24.3* 10.6*  NEUTROABS 42.6*  --  13.1*  --   --   --   --   HGB 9.2*   < > 8.3*  8.8* 8.7* 7.5* 8.0*  HCT 28.5*   < > 24.0* 26.1* 26.4* 22.4* 24.2*  MCV 86.4   < > 83.6 83.9 87.1 85.8 85.5  PLT 72*   < > 41* 52* 78* 61* 55*   < > = values in this interval not displayed.     Cardiac Enzymes: No results for input(s): CKTOTAL, CKMB, CKMBINDEX, TROPONINI in the last 168 hours.  BNP: Invalid input(s): POCBNP  CBG: Recent Labs  Lab 04/17/21 1533 04/17/21 1924 04/17/21 2322 04/18/21 0330 04/18/21 0735  GLUCAP 121* 128* 135* 97 112*     Microbiology: Results for orders placed or performed during the hospital encounter of 05/04/2021  Resp Panel by RT-PCR (Flu A&B, Covid) Nasopharyngeal Swab     Status: None   Collection Time: 04/30/2021  4:40 AM   Specimen: Nasopharyngeal Swab; Nasopharyngeal(NP) swabs in vial transport medium  Result Value Ref Range Status   SARS Coronavirus 2 by RT PCR NEGATIVE NEGATIVE Final    Comment: (NOTE) SARS-CoV-2 target nucleic acids are NOT DETECTED.  The SARS-CoV-2 RNA is generally detectable in upper respiratory specimens during the acute phase of infection. The lowest concentration of SARS-CoV-2  viral copies this assay can detect is 138 copies/mL. A negative result does not preclude SARS-Cov-2 infection and should not be used as the sole basis for treatment or other patient management decisions. A negative result may occur with  improper specimen collection/handling, submission of specimen other than nasopharyngeal swab, presence of viral mutation(s) within the areas targeted by this assay, and inadequate number of viral copies(<138 copies/mL). A negative result must be combined with clinical observations, patient history, and epidemiological information. The expected result is Negative.  Fact Sheet for Patients:  EntrepreneurPulse.com.au  Fact Sheet for Healthcare Providers:  IncredibleEmployment.be  This test is no t yet approved or cleared by the [place] FDA and  has been authorized for detection and/or diagnosis of SARS-CoV-2 by FDA under an Emergency Use Authorization (EUA). This EUA will remain  in effect (meaning this test can be used) for the duration of the COVID-19 declaration under Section 564(b)(1) of the Act, 21 U.S.C.section 360bbb-3(b)(1), unless the authorization is terminated  or revoked sooner.       Influenza A by PCR NEGATIVE NEGATIVE Final   Influenza B by PCR NEGATIVE NEGATIVE Final    Comment: (NOTE) The Xpert Xpress SARS-CoV-2/FLU/RSV plus assay is intended as an aid in the diagnosis of influenza from Nasopharyngeal swab specimens and should not be used as a sole basis for treatment. Nasal washings and aspirates are unacceptable for Xpert Xpress SARS-CoV-2/FLU/RSV testing.  Fact Sheet for Patients: EntrepreneurPulse.com.au  Fact Sheet for Healthcare Providers: IncredibleEmployment.be  This test is not yet approved or cleared by the [place] FDA and has been authorized for detection and/or diagnosis of SARS-CoV-2 by FDA under an Emergency Use Authorization  (EUA). This EUA will remain in effect (meaning this test can be used) for the duration of the COVID-19 declaration under Section 564(b)(1) of the Act, 21 U.S.C. section 360bbb-3(b)(1), unless the authorization is terminated or revoked.  Performed at [facility], [address] [place], [place] [address]   Blood culture (routine x 2)     Status: None   Collection Time: 04/18/2021  5:33 AM   Specimen: BLOOD  Result Value Ref Range Status   Specimen Description BLOOD LEFT ANTECUBITAL  Final   Special Requests   Final    BOTTLES DRAWN AEROBIC AND ANAEROBIC Blood Culture adequate volume  Culture   Final    NO GROWTH 5 DAYS Performed at [facility], [address] [place], [place] [address]    Report Status 04/12/2021 FINAL  Final  Blood culture (routine x 2)     Status: None   Collection Time: 04/12/2021  7:40 AM   Specimen: BLOOD  Result Value Ref Range Status   Specimen Description BLOOD BLOOD RIGHT WRIST  Final   Special Requests   Final    BOTTLES DRAWN AEROBIC AND ANAEROBIC Blood Culture adequate volume   Culture   Final    NO GROWTH 5 DAYS Performed at [facility], [address] [place], [place] [address]    Report Status 04/12/2021 FINAL  Final  MRSA Next Gen by PCR, Nasal     Status: None   Collection Time: 04/05/2021 12:50 PM   Specimen: Nasal Mucosa; Nasal Swab  Result Value Ref Range Status   MRSA by PCR Next Gen NOT DETECTED NOT DETECTED Final    Comment: (NOTE) The GeneXpert MRSA Assay (FDA approved for NASAL specimens only), is one component of a comprehensive MRSA colonization surveillance program. It is not intended to diagnose MRSA infection nor to guide or monitor treatment for MRSA infections. Test performance is not FDA approved in patients less than 60 years old. Performed at [facility], [address], [place], [place] [address]   Body fluid culture w Gram Stain     Status: None   Collection Time: 04/10/21   4:30 PM   Specimen: JP Drain; Body Fluid  Result Value Ref Range Status   Specimen Description   Final    JP DRAINAGE Performed at [facility], [address] [place], [place] [address]    Special Requests   Final    Normal Performed at [facility], [address] [place], [place] [address]    Gram Stain   Final    ABUNDANT WBC PRESENT, PREDOMINANTLY PMN NO ORGANISMS SEEN    Culture   Final    RARE KLEBSIELLA PNEUMONIAE CRITICAL RESULT CALLED TO, READ BACK BY AND VERIFIED WITH: RN E.JENON AT 0945 ON 04/11/2021 BY T.SAAD. Performed at [facility], [place] [address] [place], [place] [address]    Report Status 04/14/2021 FINAL  Final   Organism ID, Bacteria KLEBSIELLA PNEUMONIAE  Final      Susceptibility   Klebsiella pneumoniae - MIC*    AMPICILLIN RESISTANT Resistant     CEFAZOLIN <=4 SENSITIVE Sensitive     CEFEPIME <=0.12 SENSITIVE Sensitive     CEFTAZIDIME <=1 SENSITIVE Sensitive     CEFTRIAXONE <=0.25 SENSITIVE Sensitive     CIPROFLOXACIN <=0.25 SENSITIVE Sensitive     GENTAMICIN <=1 SENSITIVE Sensitive     IMIPENEM <=0.25 SENSITIVE Sensitive     TRIMETH/SULFA <=20 SENSITIVE Sensitive     AMPICILLIN/SULBACTAM <=2 SENSITIVE Sensitive     PIP/TAZO <=4 SENSITIVE Sensitive     * RARE KLEBSIELLA PNEUMONIAE  Aerobic/Anaerobic Culture w Gram Stain (surgical/deep wound)     Status: None (Preliminary result)   Collection Time: 04/15/21  4:21 PM   Specimen: Abdomen; Abdominal Fluid  Result Value Ref Range Status   Specimen Description   Final    ABDOMEN Performed at [facility], [address], [place], [place] [address]    Special Requests   Final    Normal Performed at [facility], [address] [place],  [address]    Gram Stain   Final  RARE WBC PRESENT,BOTH PMN AND MONONUCLEAR NO ORGANISMS SEEN    Culture   Final    NO GROWTH 2 DAYS NO ANAEROBES ISOLATED; CULTURE IN PROGRESS FOR 5 DAYS Performed at [facility] [address], [place], [place] [address]    Report Status PENDING  Incomplete  Culture, Respiratory w Gram Stain     Status: None (Preliminary result)   Collection Time: 04/16/21  8:08 AM   Specimen: Tracheal Aspirate; Respiratory  Result Value Ref Range Status   Specimen Description   Final    TRACHEAL ASPIRATE Performed at [facility], [address] [place], [place] [address]    Special Requests   Final    NONE Performed at [facility], [address] [place], [place] [address]    Gram Stain   Final    ABUNDANT WBC PRESENT,BOTH PMN AND MONONUCLEAR RARE BUDDING YEAST SEEN    Culture   Final    CULTURE REINCUBATED FOR BETTER GROWTH Performed at [facility], [place] [address], [place], [place] [address]    Report Status PENDING  Incomplete    Coagulation Studies: Recent Labs    04/16/21 0501  LABPROT 39.8*  INR 4.1*     Urinalysis: No results for input(s): COLORURINE, LABSPEC, PHURINE, GLUCOSEU, HGBUR, BILIRUBINUR, KETONESUR, PROTEINUR, UROBILINOGEN, NITRITE, LEUKOCYTESUR in the last 72 hours.  Invalid input(s): APPERANCEUR     Imaging: US Venous Img Lower Bilateral (DVT)  Result Date: 04/17/2021 CLINICAL DATA:  Edema EXAM: BILATERAL LOWER EXTREMITY VENOUS DOPPLER ULTRASOUND TECHNIQUE: Gray-scale sonography with compression, as well as color and duplex ultrasound, were performed to evaluate the deep venous system(s) from the level of the common femoral vein through the popliteal and proximal calf veins. COMPARISON:  None. FINDINGS: VENOUS Normal compressibility of the common femoral, superficial femoral, and popliteal veins, as well as the visualized calf veins. Visualized portions of profunda femoral vein and great saphenous vein unremarkable. No filling defects to suggest DVT on grayscale or color Doppler imaging. Doppler waveforms show normal direction of venous flow, normal respiratory phasicity and response to augmentation.  OTHER None. Limitations: none IMPRESSION: No femoropopliteal DVT nor evidence of DVT within the visualized calf veins. If clinical symptoms are inconsistent or if there are persistent or worsening symptoms, further imaging (possibly involving the iliac veins) may be warranted. Electronically Signed   By: Lucrezia Europe M.D.   On: 04/17/2021 14:05   DG Chest Port 1 View  Result Date: 04/16/2021 CLINICAL DATA:  Follow-up pneumonia.  Ventilator support. EXAM: PORTABLE CHEST 1 VIEW COMPARISON:  04/15/2021 FINDINGS: Endotracheal tube tip 3.5 cm above the carina. Orogastric or nasogastric tube enters the stomach. Right internal jugular central line tip in the SVC probably extends through the right atrium to the upper IVC. Left internal jugular central line tip extends through the right atrium to the upper IVC. Pacemaker/AICD appears unchanged. Persistent infiltrate and volume loss in the mid and lower lungs, left worse than right. No worsening or new finding. IMPRESSION: Persistent infiltrate and volume loss in the mid and lower lungs, left worse than right. Lines and tubes appear similar. Left internal jugular central line and right internal jugular central line extend through the right atrium to the upper IVC. Electronically Signed   By: Nelson Chimes M.D.   On: 04/16/2021 11:11     Medications:    dexmedetomidine (PRECEDEX) IV infusion 1.2 mcg/kg/hr (04/18/21 2703)   fentaNYL infusion INTRAVENOUS 350 mcg/hr (04/18/21 0600)   fluconazole (  DIFLUCAN) IV Stopped (04/17/21 1940)   And   fluconazole (DIFLUCAN) IV Stopped (04/17/21 2155)   meropenem (MERREM) IV Stopped (04/18/21 0556)   phenylephrine (NEO-SYNEPHRINE) Adult infusion Stopped (04/17/21 1707)   prismasol BGK 2/2.5 dialysis solution 2,000 mL/hr at 04/18/21 0737   prismasol BGK 2/2.5 replacement solution 500 mL/hr at 04/18/21 0738   prismasol BGK 2/2.5 replacement solution 500 mL/hr at 04/18/21 0737   vasopressin Stopped (04/18/21 0710)     chlorhexidine gluconate (MEDLINE KIT)  15 mL Mouth Rinse BID   Chlorhexidine Gluconate Cloth  6 each Topical Q0600   feeding supplement (PIVOT 1.5 CAL)  1,000 mL Per Tube Q24H   feeding supplement (PROSource TF)  45 mL Per Tube TID   free water  30 mL Per Tube Q4H   insulin aspart  0-9 Units Subcutaneous Q4H   mouth rinse  15 mL Mouth Rinse 10 times per day   multivitamin  1 tablet Per Tube QHS   multivitamin  15 mL Per Tube Daily   nutrition supplement (JUVEN)  1 packet Per Tube BID BM   pantoprazole (PROTONIX) IV  40 mg Intravenous Daily   acetaminophen **OR** acetaminophen, artificial tears, fentaNYL, midazolam, polyvinyl alcohol, sodium chloride  Assessment/ Plan:   Ms. Lindsay [name] is a 75 y.o. white female with hypertrophic cardiomyopathy, ventricular tachycardia status post AICD placement, atrial fibrillation on rivaroxaban, diabetes mellitus type II, hypertension, history of diverticulitis, osteopenia, hyperlipidemia, GERD who was admitted to [facility] on 04/24/2021 for Hyponatremia [E87.1] Bowel perforation ([place]) [K63.1] Acute renal failure, unspecified acute renal failure type ([place]) [N17.9] She was found to have bowel perforation with significant pneumoperitoneum. ExLap by Dr. Celine Ahr on 7/3 .    1.  Acute kidney injury with baseline normal creatinine with Metabolic acidosis and hyponatremia.-  Anuric. No signs of renal recovery.  - Continue continuous hemodialfiltration.    2. Bowel perforation with purulent peritoneum. Status post ExLap.   Appreciate surgery and infectious disease input.   3.  Acute respiratory failure: requiring intubation and mechanical ventilation.  Appreciate critical care input.   4. Septic shock with hypotension. Requiring vasopressors. Currently on phenylephrine and vasopressin. Off stress dose steroids Empiric meropenem and fluconazole.   5. Anemia with renal failure: status post transfusions. Hemoglobin 8  6. 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   - taken off argatroban due to heavy bleeding.      LOS: 11 Pang Robers 7/14/20229:10 AM

## 2021-04-18 NOTE — Progress Notes (Signed)
NAME:  Lindsay [name], MRN:  [id number], DOB:  08-27-1946, LOS: 58 ADMISSION DATE:  05/03/2021    Chief complaint:  Septic Shock   Abbreviated history:   75 yo F admitted with abdominal pain and sepsis with perforated viscus.  She was taken to OR s/o GI surgery with resection and ostomy removal of 1.5L of pus.  Drainage grew resistant Klebsiella.   Patient admitted to [facility] for septic shock due to intraabdominal infection.      Events:  04/17/2021 admitted post emergent ex lap for perforated viscus, peritonitis, severe sepsis 04/08/2021 piperacillin tazobactam and Eraxis initiated 7/4 remains critically ill, severe multiorgan failure 7/5 remains critically ill, severe shock, on CRRT 7/6 remains on vent, pressors and CRRT 7/7 remains on CRRT, on pressors, on vent 04/12/21- patient is critically ill on levophed, TPN, mechanical ventilation, dialysis, abdominal surgical wound with 2 JP drains actively draining, midline surgical wound ,  ostomy with active stooling, acrocyanosis, comatose in [place].  Plan to start PO feeds.  04/13/21- patient improved slighlty and weaned off all vasopressor.  Met with husband and reviwed care plan.   04/14/21 - patient has mild neurologic recovery with improved GCS score.  Plan to transfuse blood today and repeat abdominal imaging.  04/15/21- patient has left abd fluid collection. I spoke with patient regarding goals of care.  He wants procedure done and to continue medical management for now.  Reviewed procedure with IR - radiologist thinks it will be helpful to remove infected fluid from abdominal cavity.   She is off all vasorpressors  04/16/21- patient had full HD today removed 1.8 L she did well.  I met with husband and reviewed care plan.  Patient remains severely critically ill.  He is appreciative of care and thankful to entire medical team.  04/18/21- I met with family today.  I discussed in detail with husband (he takes numerous central acting medications and  has delayed sleep phase cycle with chronic pain syndrome and possible dementia) he does not have capacity to make decisions but Niece is listed as alternate POA and she is available to meet.  She wishes to continue full scope of therapy.    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 Fib Diverticulitis     Interim History / Subjective:  Severe shock On CRRT On pressors +multiorgan failure   Antimicrobials:   Antibiotics Given (last 72 hours)     Date/Time Action Medication Dose Rate   04/15/21 1830 New Bag/Given   meropenem (MERREM) 500 mg in sodium chloride 0.9 % 100 mL IVPB 500 mg 200 mL/hr   04/16/21 1707 New Bag/Given   meropenem (MERREM) 500 mg in sodium chloride 0.9 % 100 mL IVPB 500 mg 200 mL/hr   04/17/21 1423 New Bag/Given   meropenem (MERREM) 1 g in sodium chloride 0.9 % 100 mL IVPB 1 g 200 mL/hr   04/17/21 2205 New Bag/Given   meropenem (MERREM) 1 g in sodium chloride 0.9 % 100 mL IVPB 1 g 200 mL/hr   04/18/21 0526 New Bag/Given   meropenem (MERREM) 1 g in sodium chloride 0.9 % 100 mL IVPB 1 g 200 mL/hr   04/18/21 1344 New Bag/Given   meropenem (MERREM) 1 g in sodium chloride 0.9 % 100 mL IVPB 1 g 200 mL/hr        Objective   Blood pressure (!) 176/69, pulse 77, temperature (!) 92.48 F (33.6 C), resp. rate (!) 24, height '5\' 5"'  (1.651 m),  weight 82 kg, SpO2 100 %.    Vent Mode: PRVC FiO2 (%):  [28 %-40 %] 40 % Set Rate:  [20 bmp] 20 bmp Vt Set:  [450 mL] 450 mL PEEP:  [5 cmH20] 5 cmH20 Plateau Pressure:  [22 cmH20] 22 cmH20   Intake/Output Summary (Last 24 hours) at 04/18/2021 1444 Last data filed at 04/18/2021 1400 Gross per 24 hour  Intake 2872.03 ml  Output 3799 ml  Net -926.97 ml    Filed Weights   04/13/21 0500 04/14/21 0417 04/18/21 0331  Weight: 81.7 kg 81.7 kg 82 kg      REVIEW OF SYSTEMS  PATIENT IS UNABLE TO PROVIDE COMPLETE REVIEW OF SYSTEMS DUE TO SEVERE CRITICAL ILLNESS AND TOXIC METABOLIC ENCEPHALOPATHY on mechanical  ventilation with sedation    PHYSICAL EXAMINATION:  GENERAL:critically ill appearing, +resp distress HEAD: Normocephalic, atraumatic.  EYES: Pupils equal, round, reactive to light.  No scleral icterus.  MOUTH: Moist mucosal membrane. NECK: Supple. +ETT PULMONARY: +rhonchi, CARDIOVASCULAR: S1 and S2. Regular rate and rhythm. No murmurs, rubs, or gallops.  GASTROINTESTINAL:distended +drains MUSCULOSKELETAL: No swelling, clubbing, or edema.  NEUROLOGIC: GCS4T SKIN:intact,warm,dry   Labs/imaging that I havepersonally reviewed  (right click and "Reselect all SmartList Selections" daily)       ASSESSMENT AND PLAN SYNOPSIS   75 yo female with severe bowel perforation SEPSIS PRESENT ON ADMISSION and perforated ABD with ABD abscess with severe septic shock s/p ex lap with progressive multiorgan failure    SEPTIC SHOCK           Present on admission - due to intraabdominal infection with resistant Klebsiella and large area of infection with perforated viscus s/p surgery with removal of large amount of pus SOURCE-ABD bowel perf -off vasopressors now -plan to repeat CT imaging of abdomen/pelvis today  -ID on case - patient on Merem and diflucan - appreciate input    Severe ACUTE Hypoxic and Hypercapnic Respiratory Failure -continue Mechanical Ventilator support -continue Bronchodilator Therapy -Wean Fio2 and PEEP as tolerated -VAP/VENT bundle implementation  Vent Mode: PRVC FiO2 (%):  [28 %-40 %] 40 % Set Rate:  [20 bmp] 20 bmp Vt Set:  [450 mL] 450 mL PEEP:  [5 cmH20] 5 cmH20 Plateau Pressure:  [22 cmH20] 22 cmH20 -perform SBT if patient is appropriate    CARDIAC FAILURE-acute MAT, Afib -oxygen as needed -Lasix as tolerated follow up cardiac enzymes as indicated -stopped AMIOdarone today - patient is sinus rhythm with occasional PVCs and PACs - will monitor electorlytes and replete as needed     CARDIAC ICU monitoring   ACUTE KIDNEY INJURY/Renal  Failure -continue Foley Catheter-assess need -Avoid nephrotoxic agents -Follow urine output, BMP -Ensure adequate renal perfusion, optimize oxygenation -Renal dose medications On CRRT   Intake/Output Summary (Last 24 hours) at 04/18/2021 1444 Last data filed at 04/18/2021 1400 Gross per 24 hour  Intake 2872.03 ml  Output 3799 ml  Net -926.97 ml      NEUROLOGY Acute toxic metabolic encephalopathy, need for sedation Goal RASS -2 to -3   ENDO - ICU hypoglycemic\Hyperglycemia protocol -check FSBS per protocol   GI GI PROPHYLAXIS as indicated  NUTRITIONAL STATUS DIET-->TPN Constipation protocol as indicated   ELECTROLYTES -follow labs as needed -replace as needed -pharmacy consultation and following   Best practice (right click and "Reselect all SmartList Selections" daily)  Diet:  NPO Pain/Anxiety/Delirium protocol (if indicated): Yes (RASS goal -2) VAP protocol (if indicated): Yes DVT prophylaxis: Subcutaneous Heparin GI prophylaxis: PPI Glucose control:  SSI Yes Central  venous access:  Yes, and it is still needed Arterial line:  Yes, and it is still needed Foley:  Yes, and it is still needed Mobility:  bed rest  PT consulted: N/A Code Status:  DNR Disposition: ICU   Labs   CBC: Recent Labs  Lab 04/12/21 0403 04/13/21 0432 04/15/21 0454 04/15/21 1101 04/16/21 0501 04/17/21 0150 04/18/21 0338  WBC 46.8*   < > 14.4* 18.9* 22.7* 24.3* 10.6*  NEUTROABS 42.6*  --  13.1*  --   --   --   --   HGB 9.2*   < > 8.3* 8.8* 8.7* 7.5* 8.0*  HCT 28.5*   < > 24.0* 26.1* 26.4* 22.4* 24.2*  MCV 86.4   < > 83.6 83.9 87.1 85.8 85.5  PLT 72*   < > 41* 52* 78* 61* 55*   < > = values in this interval not displayed.     Basic Metabolic Panel: Recent Labs  Lab 04/15/21 0454 04/15/21 1101 04/16/21 0501 04/17/21 0150 04/17/21 1245 04/17/21 1551 04/18/21 0338  NA 136  135 135 136 132*  --  135 135  K 4.0  4.0 4.0 4.8 4.3 4.1 4.0 3.7  CL 105  105 104 105  103  --  100 104  CO2 '27  27 25 23 24  ' --  24 25  GLUCOSE 185*  184* 175* 123* 180*  --  132* 147*  BUN 32*  31* 39* 76* 72*  --  69* 46*  CREATININE 0.54  0.47 0.65 1.54* 1.72*  --  1.43* 0.85  CALCIUM 8.1*  8.0* 8.2* 8.1* 7.7*  --  8.6* 8.8*  MG 2.4 2.4 2.5*  --  2.1  --  1.9  PHOS 2.7  2.7 2.8 4.4 4.9* 5.1* 4.4 3.2    GFR: Estimated Creatinine Clearance: 60.5 mL/min (by C-G formula based on SCr of 0.85 mg/dL). Recent Labs  Lab 04/15/21 1101 04/16/21 0501 04/17/21 0150 04/18/21 0338  PROCALCITON  --  2.60 4.20 2.16  WBC 18.9* 22.7* 24.3* 10.6*  LATICACIDVEN  --  1.1  --   --      Liver Function Tests: Recent Labs  Lab 04/12/21 0536 04/12/21 1704 04/15/21 0454 04/15/21 1101 04/17/21 0150 04/17/21 1551 04/18/21 0338  AST 14*  --  20 22  --   --   --   ALT 14  --  16 17  --   --   --   ALKPHOS 69  --  99 105  --   --   --   BILITOT 0.7  --  0.9 0.8  --   --   --   PROT 4.2*  --  4.9* 5.0*  --   --   --   ALBUMIN 1.7*   < > 2.3*  2.3* 2.2* 1.8* 2.0* 1.9*   < > = values in this interval not displayed.    No results for input(s): LIPASE, AMYLASE in the last 168 hours.  No results for input(s): AMMONIA in the last 168 hours.  ABG    Component Value Date/Time   PHART 7.38 04/09/2021 0341   PCO2ART 40 04/09/2021 0341   PO2ART 115 (H) 04/09/2021 0341   HCO3 23.7 04/09/2021 0341   ACIDBASEDEF 1.3 04/09/2021 0341   O2SAT 98.4 04/09/2021 0341      Coagulation Profile: Recent Labs  Lab 04/12/21 0536 04/14/21 0450 04/14/21 1344 04/16/21 0501  INR 1.4* 1.4* 1.3* 4.1*     Cardiac Enzymes: No results  for input(s): CKTOTAL, CKMB, CKMBINDEX, TROPONINI in the last 168 hours.  HbA1C: Hgb A1c MFr Bld  Date/Time Value Ref Range Status  04/18/2021 04:51 PM 5.7 (H) 4.8 - 5.6 % Final    Comment:    (NOTE)         Prediabetes: 5.7 - 6.4         Diabetes: >6.4         Glycemic control for adults with diabetes: <7.0     CBG: Recent Labs  Lab  04/17/21 1924 04/17/21 2322 04/18/21 0330 04/18/21 0735 04/18/21 1141  GLUCAP 128* 135* 97 112* 101*     Allergies Allergies  Allergen Reactions   Amiodarone     Other reaction(s): Other (See Comments) Nausea, diarrhea, weight loss, anxiety, jitteriness   Levofloxacin Rash    Red palms, lips, forearms    Meloxicam Shortness Of Breath    Other reaction(s): Respiratory Distress   Amlodipine     Other reaction(s): Other (See Comments) Caused severe gingival hyperplasia   Amlodipine Besy-Benazepril Hcl     Severe gingival hyperplasia  Other reaction(s): Other (See Comments) Other reaction(s): Other   Beta Adrenergic Blockers     States severe fatigue  Other reaction(s): Other (See Comments) fatigue   Heparin     HIT workup pending        DVT/GI PRX  assessed I Assessed the need for Labs I Assessed the need for Foley I Assessed the need for Central Venous Line Family Discussion when available I Assessed the need for Mobilization I made an Assessment of medications to be adjusted accordingly Safety Risk assessment completed  CASE DISCUSSED IN MULTIDISCIPLINARY ROUNDS WITH ICU TEAM     Critical Care Time devoted to patient care services described in this note is 109 minutes.  Critical care was necessary to treat or prevent imminent or life-threatening deterioration.   PATIENT WITH VERY POOR PROGNOSIS I ANTICIPATE PROLONGED ICU LOS  Patient with Multiorgan failure and at high risk for cardiac arrest and death.     Ottie Glazier, M.D.  Pulmonary & Gem

## 2021-04-18 NOTE — Procedures (Signed)
Arterial Catheter Insertion Procedure Note  Mattison Golay  [id number]  [birth year]  Date:04/18/21  Time:9:25 AM    Provider Performing: Bradly Bienenstock    Procedure: Insertion of Arterial Line [phone]) with US guidance ([id number])   Indication(s) Blood pressure monitoring and/or need for frequent ABGs  Consent Unable to obtain consent due to emergent nature of procedure.  Anesthesia None   Time Out Verified patient identification, verified procedure, site/side was marked, verified correct patient position, special equipment/implants available, medications/allergies/relevant history reviewed, required imaging and test results available.   Sterile Technique Maximal sterile technique including full sterile barrier drape, hand hygiene, sterile gown, sterile gloves, mask, hair covering, sterile ultrasound probe cover (if used).   Procedure Description Area of catheter insertion was cleaned with chlorhexidine and draped in sterile fashion. With real-time ultrasound guidance an arterial catheter was placed into the left femoral artery.  Appropriate arterial tracings confirmed on monitor.     Complications/Tolerance None; patient tolerated the procedure well.   EBL Minimal   Specimen(s) None   BIOPATCH applied to the insertion site.     Darel Hong, AGACNP-BC [facility] Prefer epic messenger for cross cover needs If after hours, please call E-link

## 2021-04-18 NOTE — Progress Notes (Addendum)
[place] Hospital Day(s): 11.   Post op day(s): 11 Days Post-Op.   Interval History:  Patient seen and examined Remains intubated; sedated  Off vasopressors Leukocytosis down to 10.6K; no fevers in last 24 hours Hgb improved; 8.0 PLT 55; HIT Ab 0.935 Renal function continues to worsen again; sCr - 0.85; UO - 61 ccs Back on CRRT Surgical drains             - RUQ: 10 ccs out; serosanguinous             - RLQ: 10 ccs out; serosanguinous Percutaneous drain in left abdomen by IR (07/11); 80 ccs out; NGTD on Cx Colostomy retracted, stool in bag; 680 ccs recorded  Wound vac to midline wound; WOC to follow  She is off TPN; only on TF (40 ml/hr) On Meropenem; ID following  Discussion with PCCM staff, family planning on transitioning to comfort measures tomorrow   Vital signs in last 24 hours: [min-max] current  Temp:  [91.22 F (32.9 C)-98 F (36.7 C)] 91.76 F (33.2 C) (07/14 0400) Pulse Rate:  [69-113] 79 (07/14 0400) Resp:  [16-27] 20 (07/14 0400) BP: (87-178)/(37-82) 143/59 (07/14 0400) SpO2:  [96 %-100 %] 97 % (07/14 0400) Arterial Line BP: (83-213)/(32-69) 179/51 (07/14 0400) FiO2 (%):  [28 %] 28 % (07/14 0400) Weight:  [82 kg] 82 kg (07/14 0331)     Height: 5\' 5"  (165.1 cm) Weight: 82 kg BMI (Calculated): 30.08   Intake/Output last 2 shifts:  07/13 0701 - 07/14 0700 In: 3011.1 [I.V.:1529.5; NG/GT:739; IV Piggyback:742.6] Out: 3329 [Urine:61; Drains:250; Stool:680]   Physical Exam:  Constitutional: Intubated; Sedated HEENT: NGT in place; tube feedings Respiratory: on ventilator  Cardiovascular: tachycardic; irregular; few PVCs Gastrointestinal: Soft, unable to assess tenderness; non-distended, no rebound/guarding. 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 Newly placed drain in left abdomen; serous  Genitourinary: Foley in place  Integumentary:  Midline wound, measuring 21 x 6 x 3 cm, Wound vac in place  Labs:  CBC Latest Ref Rng & Units 04/18/2021 04/17/2021 04/16/2021  WBC 4.0 - 10.5 K/uL 10.6(H) 24.3(H) 22.7(H)  Hemoglobin 12.0 - 15.0 g/dL 8.0(L) 7.5(L) 8.7(L)  Hematocrit 36.0 - 46.0 % 24.2(L) 22.4(L) 26.4(L)  Platelets 150 - 400 K/uL 55(L) 61(L) 78(L)   CMP Latest Ref Rng & Units 04/18/2021 04/17/2021 04/17/2021  Glucose 70 - 99 mg/dL 147(H) 132(H) -  BUN 8 - 23 mg/dL 46(H) 69(H) -  Creatinine 0.44 - 1.00 mg/dL 0.85 1.43(H) -  Sodium 135 - 145 mmol/L 135 135 -  Potassium 3.5 - 5.1 mmol/L 3.7 4.0 4.1  Chloride 98 - 111 mmol/L 104 100 -  CO2 22 - 32 mmol/L 25 24 -  Calcium 8.9 - 10.3 mg/dL 8.8(L) 8.6(L) -  Total Protein 6.5 - 8.1 g/dL - - -  Total Bilirubin 0.3 - 1.2 mg/dL - - -  Alkaline Phos 38 - 126 U/L - - -  AST 15 - 41 U/L - - -  ALT 0 - 44 U/L - - -     Imaging studies: No new pertinent imaging studies   Assessment/Plan:  75 y.o. female requiring ventilator support 11 Days Post-Op s/p exploratory laparotomy, hartman's procedure, salpingo-oopherectomy, and placement of wound vac for perforated diverticulitis with frankly purulent peritonitis    - Sounds like family is planning on transitioning to comfort measures only tomorrow (07/15), we will follow peripherally as to  not disturb family. We will be readily available to answer questions or if decisions change.    - Agree with palliative care involvement; difficult situation with questionable return to prior level of function; DNR             - Appreciate PCCM assistance; ventilator and vasopressor support pre their service - Appreciate nephrology assistance; intermittent HD -vs- return to CRRT  - Continue tube feedings; tolerating goal; may need to add fiber supplement due to very watery output. - Continue IV Abx (Meropenem); ID following; follow Cx from drain placement on 07/11 - Monitor abdominal examination; on-going colostomy function              - Midline  wound care: Wound vac; MWF schedule --> WOC will take over; appreciate their assistance             - Pain control prn - Monitor leukocytosis; improved - Monitor platelets; HIT +Ab slightly elevated             - Monitor renal function; worsening  All of the above findings and recommendations were discussed with the medical team.   -- Edison Simon, PA-C [facility] 04/18/2021, 7:28 AM [phone] M-F: 7am - 4pm  I saw and evaluated the patient.  I agree with the above documentation, exam, and plan, which I have edited where appropriate. Fredirick Maudlin  7:27 AM

## 2021-04-18 NOTE — Progress Notes (Signed)
Patient lab work at 1600 revealed a potassium of 3.1. Contacted Dr. Holley Raring to address this. Per verbal orders from Dr. Holley Raring via phone, change patient CRRT bath solution from 2K to 4K. Electronic orders will need to be updated by MD. Will continue to assess patient.   Cameron Ali, RN

## 2021-04-18 NOTE — TOC Progression Note (Addendum)
Transition of Care [facility]) - Progression Note    Patient Details  Name: Lindsay [name] MRN: [id number] Date of Birth: Feb 02, 1946  Transition of Care [facility]) CM/SW [place], [place] Phone Number: [phone] 04/18/2021, 9:21 AM  Clinical Narrative:     Patient remains in critical condition. Plan is for family to arrive from [place] w/ possible transition to comfort care on 04/19/2021.   Expected Discharge Plan: Home/Self Care Barriers to Discharge: Continued Medical Work up  Expected Discharge Plan and Services Expected Discharge Plan: Home/Self Care In-house Referral: Clinical Social Work     Living arrangements for the past 2 months: Single Family Home                           HH Arranged: NA [place] Agency: NA         Social Determinants of Health (SDOH) Interventions    Readmission Risk Interventions Readmission Risk Prevention Plan 04/12/2021  Transportation Screening Complete  PCP or Specialist Appt within 5-7 Days Not Complete  Not Complete comments To be arranged at discharge  [place] Screening Complete  Medication Review (RN CM) Complete

## 2021-04-19 DIAGNOSIS — J9601 Acute respiratory failure with hypoxia: Secondary | ICD-10-CM | POA: Diagnosis not present

## 2021-04-19 DIAGNOSIS — K651 Peritoneal abscess: Secondary | ICD-10-CM | POA: Diagnosis not present

## 2021-04-19 DIAGNOSIS — D62 Acute posthemorrhagic anemia: Secondary | ICD-10-CM | POA: Diagnosis not present

## 2021-04-19 DIAGNOSIS — K631 Perforation of intestine (nontraumatic): Secondary | ICD-10-CM | POA: Diagnosis not present

## 2021-04-19 DIAGNOSIS — N179 Acute kidney failure, unspecified: Secondary | ICD-10-CM | POA: Diagnosis not present

## 2021-04-19 LAB — RENAL FUNCTION PANEL
Albumin: 2.3 g/dL — ABNORMAL LOW (ref 3.5–5.0)
Albumin: 2.7 g/dL — ABNORMAL LOW (ref 3.5–5.0)
Anion gap: 6 (ref 5–15)
Anion gap: 6 (ref 5–15)
BUN: 24 mg/dL — ABNORMAL HIGH (ref 8–23)
BUN: 20 mg/dL (ref 8–23)
CO2: 26 mmol/L (ref 22–32)
CO2: 27 mmol/L (ref 22–32)
Calcium: 8.5 mg/dL — ABNORMAL LOW (ref 8.9–10.3)
Calcium: 8.5 mg/dL — ABNORMAL LOW (ref 8.9–10.3)
Chloride: 104 mmol/L (ref 98–111)
Chloride: 102 mmol/L (ref 98–111)
Creatinine, Ser: 0.65 mg/dL (ref 0.44–1.00)
Creatinine, Ser: 0.69 mg/dL (ref 0.44–1.00)
GFR, Estimated: >60 mL/min (ref >60)
GFR, Estimated: >60 mL/min (ref >60)
Glucose, Bld: 140 mg/dL — ABNORMAL HIGH (ref 70–99)
Glucose, Bld: 142 mg/dL — ABNORMAL HIGH (ref 70–99)
Phosphorus: 2 mg/dL — ABNORMAL LOW (ref 2.5–4.6)
Phosphorus: 2 mg/dL — ABNORMAL LOW (ref 2.5–4.6)
Potassium: 3.5 mmol/L (ref 3.5–5.1)
Potassium: 3.7 mmol/L (ref 3.5–5.1)
Sodium: 136 mmol/L (ref 135–145)
Sodium: 135 mmol/L (ref 135–145)

## 2021-04-19 LAB — CBC
HCT: 25.9 % — ABNORMAL LOW (ref 36.0–46.0)
Hemoglobin: 8.6 g/dL — ABNORMAL LOW (ref 12.0–15.0)
MCH: 28.9 pg (ref 26.0–34.0)
MCHC: 33.2 g/dL (ref 30.0–36.0)
MCV: 86.9 fL (ref 80.0–100.0)
Platelets: 79 10*3/uL — ABNORMAL LOW (ref 150–400)
RBC: 2.98 MIL/uL — ABNORMAL LOW (ref 3.87–5.11)
RDW: 22 % — ABNORMAL HIGH (ref 11.5–15.5)
WBC: 9.5 10*3/uL (ref 4.0–10.5)
nRBC: 0.2 % (ref 0.0–0.2)

## 2021-04-19 LAB — GLUCOSE, CAPILLARY
Glucose-Capillary: 106 mg/dL — ABNORMAL HIGH (ref 70–99)
Glucose-Capillary: 122 mg/dL — ABNORMAL HIGH (ref 70–99)
Glucose-Capillary: 129 mg/dL — ABNORMAL HIGH (ref 70–99)
Glucose-Capillary: 130 mg/dL — ABNORMAL HIGH (ref 70–99)
Glucose-Capillary: 151 mg/dL — ABNORMAL HIGH (ref 70–99)
Glucose-Capillary: 166 mg/dL — ABNORMAL HIGH (ref 70–99)
Glucose-Capillary: 234 mg/dL — ABNORMAL HIGH (ref 70–99)
Glucose-Capillary: 307 mg/dL — ABNORMAL HIGH (ref 70–99)
Glucose-Capillary: 58 mg/dL — ABNORMAL LOW (ref 70–99)
Glucose-Capillary: 99 mg/dL (ref 70–99)

## 2021-04-19 LAB — CULTURE, RESPIRATORY W GRAM STAIN

## 2021-04-19 LAB — FIBRINOGEN: Fibrinogen: 521 mg/dL — ABNORMAL HIGH (ref 210–475)

## 2021-04-19 LAB — MAGNESIUM: Magnesium: 2.1 mg/dL (ref 1.7–2.4)

## 2021-04-19 MED ORDER — K PHOS MONO-SOD PHOS DI & MONO 155-852-130 MG PO TABS
500.0000 mg | ORAL_TABLET | Freq: Two times a day (BID) | ORAL | Status: AC
  Administered 2021-04-19 (×2): 500 mg
  Filled 2021-04-19 (×2): qty 2

## 2021-04-19 MED ORDER — SODIUM CHLORIDE 0.9 % IV SOLN
3.0000 g | Freq: Three times a day (TID) | INTRAVENOUS | Status: DC
  Administered 2021-04-19 – 2021-04-20 (×4): 3 g via INTRAVENOUS
  Filled 2021-04-19 (×2): qty 8
  Filled 2021-04-19 (×4): qty 3
  Filled 2021-04-19: qty 8

## 2021-04-19 MED ORDER — PIVOT 1.5 CAL PO LIQD
1000.0000 mL | ORAL | Status: DC
  Administered 2021-04-19 – 2021-04-20 (×2): 1000 mL
  Filled 2021-04-19: qty 1000

## 2021-04-19 MED ORDER — SODIUM CHLORIDE 0.9 % IV SOLN
INTRAVENOUS | Status: DC | PRN
  Administered 2021-04-19: 500 mL via INTRAVENOUS

## 2021-04-19 NOTE — Consult Note (Signed)
[place] Nurse Consult Note: Reason for Consult: NPWT dressing change. I am assisted in today's dressing change by Bedside RN and Student Nurse. Their assistance is appreciated. Wound type:Surgical Pressure Injury POA: N/A Measurement: Per last measurement Wound bed: pale red, thin, dry layer of adipose tissue on side walls of wound. Base is friable, sutures evident at portion of wound bed just distal to umbilicus Drainage (amount, consistency, odor) small, serous Periwound:intact Dressing procedure/placement/frequency: one piece of black foam removed from wound, wound cleansed with NS and gently patted dry. One piece of black foam used to obliterate dead space and drape applied. Dressing attached to 155mmHg continuous negative pressure and an immediate seal is achieved. Dressing is labeled. Cannister is changed. Patient is intubated and on CRRT. She tolerated the procedure well.  [place] Nurse ostomy consult note Stoma type/location: left upper quadrant colostomy Stomal assessment/size: oval and slightly below skin level, 1 inch x 1 and 1/4 inch. Deep red. Peristomal assessment: intact Treatment options for stomal/peristomal skin: skin barrier ring Output: small amount of dark brown pasty stool at stoma at pouch change. Ostomy pouching: 1pc. Pouching system, easily compressible convex.  Education provided: None today Enrolled patient in [place] program:No  Supplies at bedside for an additional VAC and pouch change.  Next scheduled visit is Monday, 7/18.  [place] nursing team will follow, will remain available to this patient, the nursing and medical teams.  Thanks, Maudie Flakes, MSN, RN, [place], Arther Abbott  Pager# [phone]

## 2021-04-19 NOTE — Progress Notes (Signed)
ID Remains intubated BP (!) 155/68   Pulse (!) 44   Temp (!) 93.56 F (34.2 C)   Resp 20   Ht 5\' 5"  (1.651 m)   Wt 83.3 kg   SpO2 97%   BMI 30.56 kg/m   On CRRT No pressors Rt IJ  triple lumen Left IJ dialysis cath Chest b/l air entry Hss1s2 Abd  soft- wound vac covering the surgical incision 2 JP drains on the rt One rain on the left Colostomy Foley cath Femoral A line left  Labs CBC Latest Ref Rng & Units 04/19/2021 04/18/2021 04/17/2021  WBC 4.0 - 10.5 K/uL 9.5 10.6(H) 24.3(H)  Hemoglobin 12.0 - 15.0 g/dL 8.6(L) 8.0(L) 7.5(L)  Hematocrit 36.0 - 46.0 % 25.9(L) 24.2(L) 22.4(L)  Platelets 150 - 400 K/uL 79(L) 55(L) 61(L)    CMP Latest Ref Rng & Units 04/19/2021 04/18/2021 04/18/2021  Glucose 70 - 99 mg/dL 140(H) 127(H) 147(H)  BUN 8 - 23 mg/dL 24(H) 35(H) 46(H)  Creatinine 0.44 - 1.00 mg/dL 0.65 0.66 0.85  Sodium 135 - 145 mmol/L 136 136 135  Potassium 3.5 - 5.1 mmol/L 3.5 3.1(L) 3.7  Chloride 98 - 111 mmol/L 104 101 104  CO2 22 - 32 mmol/L 26 26 25   Calcium 8.9 - 10.3 mg/dL 8.5(L) 9.7 8.8(L)  Total Protein 6.5 - 8.1 g/dL - - -  Total Bilirubin 0.3 - 1.2 mg/dL - - -  Alkaline Phos 38 - 126 U/L - - -  AST 15 - 41 U/L - - -  ALT 0 - 44 U/L - - -     Micro 04/13/2021- BC NG 04/10/21- JP drain culture rare kleb 04/15/21 Abdominal fluid culture NG  Antibioitcs Zosyn 7/3>>7/8 Meropenem 7/8>>7/15 Eraxis 7/3>.7/8 Fluconazole 7/8>>7/14   Impression/recommendation Perforated diverticulitis with pneumoperitoneum, peritonitis, intraabdominal abscess on presentation. S/p exp lap and hartmans procedure with colostomy on 04/16/2021 IR placed drain on 7/11 due to persistent intraabdominal fluid and leucocytosis- culture from that is neg- prior culture had klebsillea ( sensitive one) Leucocytosis has resolved- Day 12 of broad spectrum antibiotic ( PIP tazo>. Meropenem = antifungal) Pt seems to have attained good source control So will de-escalate meropenem to Unasyn and we may be able  to DC antibiotics on Monday. DC antifungal  AKI- on CRRT  Anemia  Discussed the management with the care team ID will follow her peripherally this weekend- call if needed

## 2021-04-19 NOTE — Progress Notes (Signed)
Chaplain Maggie made a follow up visit with patient and her husband, Lindsay [name]. Space was made for storytelling and empathetic listening. When Chaplain invited family member to join in a traditional prayer at the bedside he encouraged the Chaplain to pray while while he remained sitting alongside as a witness to the moment. Chaplain prayed the Lord's prayer at bedside. Patient's husband expressed his appreciation for the visit and the prayer. He continued to share how difficult this moment is for him, but he trusts he is making wise decisions. On Call Chaplain is available for continued support as needed.

## 2021-04-19 NOTE — Progress Notes (Signed)
Chaplain Maggie made visit for prayer and silent ministry of presence at bedside. Prayer of mercy and blessing of peace offered on patient's behalf. Continued prayer and support available as needed per on call chaplain.

## 2021-04-19 NOTE — TOC Progression Note (Signed)
Transition of Care [facility]) - Progression Note    Patient Details  Name: Takyla Kuchera MRN: [id number] Date of Birth: 1946-05-23  Transition of Care [facility]) CM/SW [place], [place] Phone Number: [phone] 04/19/2021, 1:29 PM  Clinical Narrative:     Attending met with patient's husband Langill,Jim [phone] [facility]) and husband's son Christia [place]. Mr. Roa was oriented X4 and understood the patient current medical condition. Patient remains DNR, and end of live visitation policy has been initiated.  Expected Discharge Plan: Home/Self Care Barriers to Discharge: Continued Medical Work up  Expected Discharge Plan and Services Expected Discharge Plan: Home/Self Care In-house Referral: Clinical Social Work     Living arrangements for the past 2 months: Single Family Home                           HH Arranged: NA [place] Agency: NA         Social Determinants of Health (SDOH) Interventions    Readmission Risk Interventions Readmission Risk Prevention Plan 04/12/2021  Transportation Screening Complete  PCP or Specialist Appt within 5-7 Days Not Complete  Not Complete comments To be arranged at discharge  [place] Screening Complete  Medication Review (RN CM) Complete

## 2021-04-19 NOTE — Consult Note (Signed)
PHARMACY CONSULT NOTE  Pharmacy Consult for Electrolyte Monitoring and Replacement   Recent Labs: Potassium (mmol/L)  Date Value  04/19/2021 3.5   Magnesium (mg/dL)  Date Value  04/19/2021 2.1   Calcium (mg/dL)  Date Value  04/19/2021 8.5 (L)   Albumin (g/dL)  Date Value  04/19/2021 2.3 (L)   Phosphorus (mg/dL)  Date Value  04/19/2021 2.0 (L)   Sodium (mmol/L)  Date Value  04/19/2021 136   Assessment: Patient is a 75 y/o F with medical history including diverticulitis, atrial fibrillation, s/p AICD due to VT, HCM, GERD who is admitted with severe sepsis secondary to bowel perforation with purulent peritonitis. She is status post exploratory laparotomy, hartman's procedure, salpingo-oopherectomy. Hospitalization has been complicated by renal failure requiring CRRT. Patient is with ventilator dependent respiratory failure. She is currently intubated, sedated, and on mechanical ventilation in the ICU. Pharmacy is consulted to assist with electrolyte monitoring and replacement as indicated.  Patient remains on CRRT  Goal of Therapy:  Electrolytes within normal limits  Plan:  --Phosphorous 2, will give K Phos Neutral 500 mg BID x 2 doses --Renal function panels BID while on CRRT  Benita Gutter 04/19/2021 12:08 PM

## 2021-04-19 NOTE — Progress Notes (Signed)
[facility]  ROUNDING NOTE   Subjective:   Patient continues on CRRT at this time. Remains critically ill. Potassium level improved after switch to 4K bath  Objective:  Vital signs in last 24 hours:  Temp:  [93.38 F (34.1 C)-94.1 F (34.5 C)] 94.1 F (34.5 C) (07/15 2045) Pulse Rate:  [32-99] 41 (07/15 2045) Resp:  [17-22] 20 (07/15 2045) BP: (91-173)/(46-121) 171/121 (07/15 2000) SpO2:  [96 %-99 %] 97 % (07/15 2045) Arterial Line BP: (117-187)/(47-77) 158/60 (07/15 2045) FiO2 (%):  [40 %] 40 % (07/15 2000) Weight:  [83.3 kg] 83.3 kg (07/15 0331)  Weight change: 1.3 kg Filed Weights   04/14/21 0417 04/18/21 0331 04/19/21 0331  Weight: 81.7 kg 82 kg 83.3 kg    Intake/Output: I/O last 3 completed shifts: In: 4362.7 [I.V.:2137.9; NG/GT:908; IV Piggyback:1316.8] Out: 1610 [Urine:5; Drains:255; RUEAV:4098; Stool:75]   Intake/Output this shift:  Total I/O In: 88.9 [I.V.:88.9] Out: 100 [Other:100]  Physical Exam: General: Critically ill  Head: ETT  Eyes: Anicteric  Neck: Supple  Lungs:  Scattered rhonchi  Heart: irregular  Abdomen:  +midline incision, +JP drains  Extremities: + dependent edema and in extremities  Neurologic: Intubated, sedated  Skin: No acute rash  Access: Left IJ temporary dialysis catheter    Basic Metabolic Panel: Recent Labs  Lab 04/15/21 1101 04/16/21 0501 04/17/21 0150 04/17/21 1245 04/17/21 1551 04/18/21 0338 04/18/21 1554 04/19/21 0342 04/19/21 1600  NA 135 136   < >  --  135 135 136 136 135  K 4.0 4.8   < > 4.1 4.0 3.7 3.1* 3.5 3.7  CL 104 105   < >  --  100 104 101 104 102  CO2 25 23   < >  --  _0 GLUCOSE 175* 123*   < >  --  132* 147* 127* 140* 142*  BUN 39* 76*   < >  --  69* 46* 35* 24* 20  CREATININE 0.65 1.54*   < >  --  1.43* 0.85 0.66 0.65 0.69  CALCIUM 8.2* 8.1*   < >  --  8.6* 8.8* 9.7 8.5* 8.5*  MG 2.4 2.5*  --  2.1  --  1.9  --  2.1  --   PHOS 2.8 4.4   < > 5.1* 4.4 3.2 2.8 2.0* 2.0*    < > = values in this interval not displayed.     Liver Function Tests: Recent Labs  Lab 04/15/21 0454 04/15/21 1101 04/17/21 0150 04/17/21 1551 04/18/21 0338 04/18/21 1554 04/19/21 0342 04/19/21 1600  AST 20 22  --   --   --   --   --   --   ALT 16 17  --   --   --   --   --   --   ALKPHOS 99 105  --   --   --   --   --   --   BILITOT 0.9 0.8  --   --   --   --   --   --   PROT 4.9* 5.0*  --   --   --   --   --   --   ALBUMIN 2.3*  2.3* 2.2*   < > 2.0* 1.9* 2.7* 2.3* 2.7*   < > = values in this interval not displayed.    No results for input(s): LIPASE, AMYLASE in the last 168 hours.  No results for input(s): AMMONIA in  the last 168 hours.  CBC: Recent Labs  Lab 04/15/21 0454 04/15/21 1101 04/16/21 0501 04/17/21 0150 04/18/21 0338 04/19/21 0342  WBC 14.4* 18.9* 22.7* 24.3* 10.6* 9.5  NEUTROABS 13.1*  --   --   --   --   --   HGB 8.3* 8.8* 8.7* 7.5* 8.0* 8.6*  HCT 24.0* 26.1* 26.4* 22.4* 24.2* 25.9*  MCV 83.6 83.9 87.1 85.8 85.5 86.9  PLT 41* 52* 78* 61* 55* 79*     Cardiac Enzymes: No results for input(s): CKTOTAL, CKMB, CKMBINDEX, TROPONINI in the last 168 hours.  BNP: Invalid input(s): POCBNP  CBG: Recent Labs  Lab 04/19/21 0338 04/19/21 0737 04/19/21 1252 04/19/21 1718 04/19/21 1915  GLUCAP 129* 122* 151* 130* 106*     Microbiology: Results for orders placed or performed during the hospital encounter of 04/25/2021  Resp Panel by RT-PCR (Flu A&B, Covid) Nasopharyngeal Swab     Status: None   Collection Time: 04/17/2021  4:40 AM   Specimen: Nasopharyngeal Swab; Nasopharyngeal(NP) swabs in vial transport medium  Result Value Ref Range Status   SARS Coronavirus 2 by RT PCR NEGATIVE NEGATIVE Final    Comment: (NOTE) SARS-CoV-2 target nucleic acids are NOT DETECTED.  The SARS-CoV-2 RNA is generally detectable in upper respiratory specimens during the acute phase of infection. The lowest concentration of SARS-CoV-2 viral copies this assay can  detect is 138 copies/mL. A negative result does not preclude SARS-Cov-2 infection and should not be used as the sole basis for treatment or other patient management decisions. A negative result may occur with  improper specimen collection/handling, submission of specimen other than nasopharyngeal swab, presence of viral mutation(s) within the areas targeted by this assay, and inadequate number of viral copies(<138 copies/mL). A negative result must be combined with clinical observations, patient history, and epidemiological information. The expected result is Negative.  Fact Sheet for Patients:  EntrepreneurPulse.com.au  Fact Sheet for Healthcare Providers:  IncredibleEmployment.be  This test is no t yet approved or cleared by the [place] FDA and  has been authorized for detection and/or diagnosis of SARS-CoV-2 by FDA under an Emergency Use Authorization (EUA). This EUA will remain  in effect (meaning this test can be used) for the duration of the COVID-19 declaration under Section 564(b)(1) of the Act, 21 U.S.C.section 360bbb-3(b)(1), unless the authorization is terminated  or revoked sooner.       Influenza A by PCR NEGATIVE NEGATIVE Final   Influenza B by PCR NEGATIVE NEGATIVE Final    Comment: (NOTE) The Xpert Xpress SARS-CoV-2/FLU/RSV plus assay is intended as an aid in the diagnosis of influenza from Nasopharyngeal swab specimens and should not be used as a sole basis for treatment. Nasal washings and aspirates are unacceptable for Xpert Xpress SARS-CoV-2/FLU/RSV testing.  Fact Sheet for Patients: EntrepreneurPulse.com.au  Fact Sheet for Healthcare Providers: IncredibleEmployment.be  This test is not yet approved or cleared by the [place] FDA and has been authorized for detection and/or diagnosis of SARS-CoV-2 by FDA under an Emergency Use Authorization (EUA). This EUA will remain in  effect (meaning this test can be used) for the duration of the COVID-19 declaration under Section 564(b)(1) of the Act, 21 U.S.C. section 360bbb-3(b)(1), unless the authorization is terminated or revoked.  Performed at [facility], [address] [place], [place] [address]   Blood culture (routine x 2)     Status: None   Collection Time: 05/03/2021  5:33 AM   Specimen: BLOOD  Result Value Ref Range Status  Specimen Description BLOOD LEFT ANTECUBITAL  Final   Special Requests   Final    BOTTLES DRAWN AEROBIC AND ANAEROBIC Blood Culture adequate volume   Culture   Final    NO GROWTH 5 DAYS Performed at [facility], [address] [place], [place] [address]    Report Status 04/12/2021 FINAL  Final  Blood culture (routine x 2)     Status: None   Collection Time: 04/17/2021  7:40 AM   Specimen: BLOOD  Result Value Ref Range Status   Specimen Description BLOOD BLOOD RIGHT WRIST  Final   Special Requests   Final    BOTTLES DRAWN AEROBIC AND ANAEROBIC Blood Culture adequate volume   Culture   Final    NO GROWTH 5 DAYS Performed at [facility], [address] [place], [place] [address]    Report Status 04/12/2021 FINAL  Final  MRSA Next Gen by PCR, Nasal     Status: None   Collection Time: 04/24/2021 12:50 PM   Specimen: Nasal Mucosa; Nasal Swab  Result Value Ref Range Status   MRSA by PCR Next Gen NOT DETECTED NOT DETECTED Final    Comment: (NOTE) The GeneXpert MRSA Assay (FDA approved for NASAL specimens only), is one component of a comprehensive MRSA colonization surveillance program. It is not intended to diagnose MRSA infection nor to guide or monitor treatment for MRSA infections. Test performance is not FDA approved in patients less than 25 years old. Performed at [facility], [address], [place], [place] [address]   Body fluid culture w Gram Stain     Status: None   Collection Time: 04/10/21  4:30 PM   Specimen: JP Drain;  Body Fluid  Result Value Ref Range Status   Specimen Description   Final    JP DRAINAGE Performed at [facility], [address] [place], [place] [address]    Special Requests   Final    Normal Performed at [facility], [address] [place], [place] [address]    Gram Stain   Final    ABUNDANT WBC PRESENT, PREDOMINANTLY PMN NO ORGANISMS SEEN    Culture   Final    RARE KLEBSIELLA PNEUMONIAE CRITICAL RESULT CALLED TO, READ BACK BY AND VERIFIED WITH: RN E.JENON AT 0945 ON 04/11/2021 BY T.SAAD. Performed at [facility], [place] [address], [place], [place] [address]    Report Status 04/14/2021 FINAL  Final   Organism ID, Bacteria KLEBSIELLA PNEUMONIAE  Final      Susceptibility   Klebsiella pneumoniae - MIC*    AMPICILLIN RESISTANT Resistant     CEFAZOLIN <=4 SENSITIVE Sensitive     CEFEPIME <=0.12 SENSITIVE Sensitive     CEFTAZIDIME <=1 SENSITIVE Sensitive     CEFTRIAXONE <=0.25 SENSITIVE Sensitive     CIPROFLOXACIN <=0.25 SENSITIVE Sensitive     GENTAMICIN <=1 SENSITIVE Sensitive     IMIPENEM <=0.25 SENSITIVE Sensitive     TRIMETH/SULFA <=20 SENSITIVE Sensitive     AMPICILLIN/SULBACTAM <=2 SENSITIVE Sensitive     PIP/TAZO <=4 SENSITIVE Sensitive     * RARE KLEBSIELLA PNEUMONIAE  Aerobic/Anaerobic Culture w Gram Stain (surgical/deep wound)     Status: None (Preliminary result)   Collection Time: 04/15/21  4:21 PM   Specimen: Abdomen; Abdominal Fluid  Result Value Ref Range Status   Specimen Description   Final    ABDOMEN Performed at [facility], [address] [place], [place] [address]    Special Requests  Final    Normal Performed at [facility], [place], [place] [address]    Gram Stain   Final    RARE WBC PRESENT,BOTH PMN AND MONONUCLEAR NO ORGANISMS SEEN    Culture   Final    NO GROWTH 4 DAYS NO ANAEROBES ISOLATED; CULTURE IN PROGRESS FOR 5 DAYS Performed at [facility], [place] [address] [place], [place] [address]    Report Status PENDING  Incomplete  Culture, Respiratory w Gram Stain     Status: None   Collection Time: 04/16/21  8:08 AM   Specimen: Tracheal Aspirate; Respiratory  Result Value Ref Range Status   Specimen Description   Final    TRACHEAL ASPIRATE Performed at [facility], [address], [place], [place] [address]    Special Requests   Final    NONE Performed at [facility], [address] [place], [place] [address]    Gram Stain   Final    ABUNDANT WBC PRESENT,BOTH PMN AND MONONUCLEAR RARE BUDDING YEAST SEEN Performed at [facility], [place] [address], [place], [place] [address]    Culture FEW CANDIDA PARAPSILOSIS  Final   Report Status 04/19/2021 FINAL  Final    Coagulation Studies: No results for input(s): LABPROT, INR in the last 72 hours.   Urinalysis: No results for input(s): COLORURINE, LABSPEC, PHURINE, GLUCOSEU, HGBUR, BILIRUBINUR, KETONESUR, PROTEINUR, UROBILINOGEN, NITRITE, LEUKOCYTESUR in the last 72 hours.  Invalid input(s): APPERANCEUR     Imaging: No results found.   Medications:    sodium chloride 500 mL (04/19/21 2123)   albumin human Stopped (04/19/21 1136)   ampicillin-sulbactam (UNASYN) IV 3 g (04/19/21 2125)   dextrose 50 mL/hr at 04/19/21 2000   fentaNYL infusion INTRAVENOUS 400 mcg/hr (04/19/21 2000)   phenylephrine (NEO-SYNEPHRINE) Adult infusion Stopped (04/18/21 1711)   prismasol BGK 2/2.5 dialysis solution 2,000 mL/hr at 04/19/21 1707   prismasol BGK 2/2.5 replacement solution 500 mL/hr at 04/19/21 1707   prismasol BGK 2/2.5 replacement solution 500 mL/hr at 04/19/21 1707    chlorhexidine gluconate (MEDLINE KIT)  15 mL Mouth Rinse BID   Chlorhexidine Gluconate Cloth  6 each Topical Q0600   feeding supplement (PIVOT 1.5 CAL)  1,000 mL Per Tube Q24H   feeding supplement (PROSource TF)  45 mL Per Tube TID   free water  30 mL Per Tube Q4H   insulin aspart  0-9 Units Subcutaneous  Q4H   mouth rinse  15 mL Mouth Rinse 10 times per day   multivitamin  1 tablet Per Tube QHS   multivitamin  15 mL Per Tube Daily   nutrition supplement (JUVEN)  1 packet Per Tube BID BM   pantoprazole (PROTONIX) IV  40 mg Intravenous Daily   sodium chloride, acetaminophen **OR** acetaminophen, artificial tears, fentaNYL, metoprolol tartrate, midazolam, polyvinyl alcohol, sodium chloride  Assessment/ Plan:   Ms. Lindsay [name] is a 75 y.o. white female with hypertrophic cardiomyopathy, ventricular tachycardia status post AICD placement, atrial fibrillation on rivaroxaban, diabetes mellitus type II, hypertension, history of diverticulitis, osteopenia, hyperlipidemia, GERD who was admitted to [facility] on 05/03/2021 for Hyponatremia [E87.1] Bowel perforation ([place]) [K63.1] Acute renal failure, unspecified acute renal failure type ([place]) [N17.9] She was found to have bowel perforation with significant pneumoperitoneum. ExLap by Dr. Celine Ahr on 7/3 .    1.  Acute kidney injury with baseline normal creatinine with Metabolic acidosis and hyponatremia.-  Maintain the patient on CRRT until further disposition determined by family.  2. Bowel perforation with purulent peritoneum. Status post ExLap.   As per surgery and ID.  3.  Acute respiratory failure: continue mechanical ventilation for now but possible transition to comfort care  4. Septic shock with hypotension.  Continue vasopressors to maintain MAP of 65 or greater.  5. Anemia with renal failure: Hemoglobin up to 8.6 posttransfusion.  6. 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   - taken off argatroban due to heavy bleeding.      LOS: 12 Lindsay [name] 7/15/20229:29 PM

## 2021-04-19 NOTE — Progress Notes (Signed)
Pharmacy Antibiotic Note  Lindsay [name] is a 75 y.o. female w/ PMH of HTN, DM, CHF, atrial fibrillation, diverticular disease, sigmoid stricture admitted on 04/12/2021 post emergent ex lap for perforated viscus, peritonitis, severe sepsis. Pharmacy has been consulted for Unasyn dosing.  Today, 04/19/2021 Day # 13 antibacterial and antifungal coverage IR placed abdominal drain into fluid collection 7/11 Leukocytosis resolved, hypothermic Renal - transitioned off CRRT >> HD. CRRT re-started 7/13 7/12 Tracheal aspirate: Candida parapsilosis -F 7/11 Abdominal drain: NGTD 7/6 JP drain = K pneumoniae (Amp R)-F 7/3 Blood cx: NG-F Remains VDRF off of pressors ID consulted 7/7 Discussed with ID, okay to stop Diflucan and meropenem today. Start Unasyn  Plan: Unasyn 3 g IV q8h (CRRT dosing)  Continue to follow cultures. Continue to follow RRT plan per nephrology and adjust antimicrobial dosing as indicated.  Height: 5\' 5"  (165.1 cm) Weight: 83.3 kg (183 lb 10.3 oz) IBW/kg (Calculated) : 57  Temp (24hrs), Avg:93.1 F (33.9 C), Min:91.58 F (33.1 C), Max:93.92 F (34.4 C)  Recent Labs  Lab 04/15/21 1101 04/16/21 0501 04/17/21 0150 04/17/21 1551 04/18/21 0338 04/18/21 1554 04/19/21 0342  WBC 18.9* 22.7* 24.3*  --  10.6*  --  9.5  CREATININE 0.65 1.54* 1.72* 1.43* 0.85 0.66 0.65  LATICACIDVEN  --  1.1  --   --   --   --   --      Estimated Creatinine Clearance: 64.7 mL/min (by C-G formula based on SCr of 0.65 mg/dL).    Allergies  Allergen Reactions   Amiodarone     Other reaction(s): Other (See Comments) Nausea, diarrhea, weight loss, anxiety, jitteriness   Levofloxacin Rash    Red palms, lips, forearms    Meloxicam Shortness Of Breath    Other reaction(s): Respiratory Distress   Amlodipine     Other reaction(s): Other (See Comments) Caused severe gingival hyperplasia   Amlodipine Besy-Benazepril Hcl     Severe gingival hyperplasia  Other reaction(s): Other (See  Comments) Other reaction(s): Other   Beta Adrenergic Blockers     States severe fatigue  Other reaction(s): Other (See Comments) fatigue   Heparin     HIT workup pending    Antimicrobials this admission: Anidulafungin 7/3 >> 7/7 Fluconazole 7/8 >> 7/14 Zosyn 7/3 >> 7/8 Meropenem 7/8 >> 7/15 Unasyn 7/15 >>  Microbiology results: 0712 Tracheal aspirate: Candida parapsilosis-F 07/11 Abdominal drain: NGTD 07/06 JP drain: K pneumo (Amp R)-F 07/03 BCx: NG 07/03 MRSA PCR: negative 07/03 SARS CoV-2: negative 07/03 influenza A/B: negative  Thank you for allowing pharmacy to be a part of this patient's care.  Benita Gutter  04/19/2021 11:37 AM

## 2021-04-19 NOTE — Progress Notes (Signed)
NAME:  Lindsay [name], MRN:  [id number], DOB:  24-Apr-1946, LOS: 12 ADMISSION DATE:  04/27/2021  BRIEF SYNOPSIS  75 yo F admitted with abdominal pain and sepsis with perforated viscus.  She was taken to OR s/o GI surgery with resection and ostomy removal of 1.5L of pus.  Drainage grew resistant Klebsiella.   Patient admitted to [facility] for septic shock due to intraabdominal infection.    Significant Hospital Events: Including procedures, antibiotic start and stop dates in addition to other pertinent events    04/27/2021 admitted post emergent ex lap for perforated viscus, peritonitis, severe sepsis 04/21/2021 piperacillin tazobactam and Eraxis initiated 7/4 remains critically ill, severe multiorgan failure 7/5 remains critically ill, severe shock, on CRRT 7/6 remains on vent, pressors and CRRT 7/7 remains on CRRT, on pressors, on vent 04/12/21- patient is critically ill on levophed, TPN, mechanical ventilation, dialysis, abdominal surgical wound with 2 JP drains actively draining, midline surgical wound ,  ostomy with active stooling, acrocyanosis, comatose in [place].  Plan to start PO feeds.  04/13/21- patient improved slighlty and weaned off all vasopressor.  Met with husband and reviwed care plan.  04/14/21 - patient has mild neurologic recovery with improved GCS score.  Plan to transfuse blood today and repeat abdominal imaging. 04/15/21- patient has left abd fluid collection. I spoke with patient regarding goals of care.  He wants procedure done and to continue medical management for now.  Reviewed procedure with IR - radiologist thinks it will be helpful to remove infected fluid from abdominal cavity.   She is off all vasorpressors 04/16/21- patient had full HD today removed 1.8 L she did well.  I met with husband and reviewed care plan.  Patient remains severely critically ill.  He is appreciative of care and thankful to entire medical team. 04/18/21- I met with family today.  I discussed in detail  with husband (he takes numerous central acting medications and has delayed sleep phase cycle with chronic pain syndrome and possible dementia) he does not have capacity to make decisions but Niece is listed as alternate POA and she is available to meet.  She wishes to continue full scope of therapy.  7/15 Restarted CRRT   Antimicrobials:   Antibiotics Given (last 72 hours)     Date/Time Action Medication Dose Rate   04/16/21 1707 New Bag/Given   meropenem (MERREM) 500 mg in sodium chloride 0.9 % 100 mL IVPB 500 mg 200 mL/hr   04/17/21 1423 New Bag/Given   meropenem (MERREM) 1 g in sodium chloride 0.9 % 100 mL IVPB 1 g 200 mL/hr   04/17/21 2205 New Bag/Given   meropenem (MERREM) 1 g in sodium chloride 0.9 % 100 mL IVPB 1 g 200 mL/hr   04/18/21 0526 New Bag/Given   meropenem (MERREM) 1 g in sodium chloride 0.9 % 100 mL IVPB 1 g 200 mL/hr   04/18/21 1344 New Bag/Given   meropenem (MERREM) 1 g in sodium chloride 0.9 % 100 mL IVPB 1 g 200 mL/hr   04/18/21 2201 New Bag/Given   meropenem (MERREM) 1 g in sodium chloride 0.9 % 100 mL IVPB 1 g 200 mL/hr   04/19/21 0504 New Bag/Given   meropenem (MERREM) 1 g in sodium chloride 0.9 % 100 mL IVPB 1 g 200 mL/hr            Interim History / Subjective:  Remains critically ill Multiorgan failure Remains on vent        Objective   Blood pressure Marland Kitchen)  152/64, pulse (!) 54, temperature (!) 93.56 F (34.2 C), resp. rate 20, height '5\' 5"'  (1.651 m), weight 83.3 kg, SpO2 99 %. CVP:  [3 mmHg-9 mmHg] 9 mmHg  Vent Mode: (P) PRVC FiO2 (%):  [28 %-40 %] (P) 40 % Set Rate:  [20 bmp] (P) 20 bmp Vt Set:  [450 mL] (P) 450 mL PEEP:  [5 cmH20] (P) 5 cmH20 Pressure Support:  [20 cmH20] 20 cmH20 Plateau Pressure:  [22 cmH20] 22 cmH20   Intake/Output Summary (Last 24 hours) at 04/19/2021 0735 Last data filed at 04/19/2021 0700 Gross per 24 hour  Intake 3233.71 ml  Output 3547 ml  Net -313.29 ml   Filed Weights   04/14/21 0417 04/18/21 0331  04/19/21 0331  Weight: 81.7 kg 82 kg 83.3 kg      REVIEW OF SYSTEMS  PATIENT IS UNABLE TO PROVIDE COMPLETE REVIEW OF SYSTEMS DUE TO SEVERE CRITICAL ILLNESS AND TOXIC METABOLIC ENCEPHALOPATHY   PHYSICAL EXAMINATION:  GENERAL:critically ill appearing, +resp distress HEAD: Normocephalic, atraumatic.  EYES: Pupils equal, round, reactive to light.  No scleral icterus.  MOUTH: Moist mucosal membrane. NECK: Supple. PULMONARY: +rhonchi, +wheezing CARDIOVASCULAR: S1 and S2. Regular rate and rhythm. No murmurs, rubs, or gallops.  Gastrointestinal: Soft, unable to assess tenderness; non-distended, no rebound/guarding. 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 Newly placed drain in left abdomen; serous  SKIN Midline wound, measuring 21 x 6 x 3 cm, Wound vac in place NEUROLOGIC: obtunded    Labs/imaging that I havepersonally reviewed  (right click and "Reselect all SmartList Selections" daily)      ASSESSMENT AND PLAN SYNOPSIS   75 yo female with severe bowel perforation SEPSIS PRESENT ON ADMISSION and perforated ABD with ABD abscess with severe septic shock s/p ex lap with progressive multiorgan failure Failure to wean from vent and failure to wean from CRRT, post op severe resp failure due to sepsis and abd distention  Severe ACUTE Hypoxic and Hypercapnic Respiratory Failure -continue Mechanical Ventilator support -continue Bronchodilator Therapy -Wean Fio2 and PEEP as tolerated -VAP/VENT bundle implementation Unable to wean from vent  Vent Mode: (P) PRVC FiO2 (%):  [28 %-40 %] (P) 40 % Set Rate:  [20 bmp] (P) 20 bmp Vt Set:  [450 mL] (P) 450 mL PEEP:  [5 cmH20] (P) 5 cmH20 Pressure Support:  [20 cmH20] 20 cmH20 Plateau Pressure:  [22 cmH20] 22 cmH20   CARDIAC FAILURE-acute MAT, Afib -Lasix as tolerated -follow up cardiac enzymes as indicated -stopped AMIOdarone  - patient is sinus rhythm  with occasional PVCs and PACs - will monitor electorlytes and replete as needed   CARDIAC ICU monitoring   ACUTE KIDNEY INJURY/Renal Failure -continue Foley Catheter-assess need -Avoid nephrotoxic agents -Follow urine output, BMP -Ensure adequate renal perfusion, optimize oxygenation -Renal dose medications Remains on CRRT   Intake/Output Summary (Last 24 hours) at 04/19/2021 0735 Last data filed at 04/19/2021 0700 Gross per 24 hour  Intake 3233.71 ml  Output 3547 ml  Net -313.29 ml     NEUROLOGY Acute toxic metabolic encephalopathy, need for sedation Goal RASS -2 to -3   SEPTIC SHOCK SOURCE-ABD perforation Off pressors for now -use vasopressors to keep MAP>65 as needed -follow ABG and LA -follow up cultures -emperic ABX  INFECTIOUS DISEASE -continue antibiotics as prescribed -follow up cultures -follow up ID consultation  ENDO - ICU hypoglycemic\Hyperglycemia protocol -check FSBS per protocol   GI GI PROPHYLAXIS as indicated  NUTRITIONAL STATUS  DIET-->TPN Constipation protocol as indicated   ELECTROLYTES -follow labs as needed -replace as needed -pharmacy consultation and following    Best practice (right click and "Reselect all SmartList Selections" daily)  Diet:  NPO Pain/Anxiety/Delirium protocol (if indicated): Yes (RASS goal -2) VAP protocol (if indicated): Yes DVT prophylaxis: Subcutaneous Heparin GI prophylaxis: PPI Glucose control:  SSI Yes Central venous access:  Yes, and it is still needed Arterial line:  Yes, and it is still needed Foley:  Yes, and it is still needed Mobility:  bed rest  PT consulted: N/A Code Status:  DNR Disposition: ICU    Labs   CBC: Recent Labs  Lab 04/15/21 0454 04/15/21 1101 04/16/21 0501 04/17/21 0150 04/18/21 0338 04/19/21 0342  WBC 14.4* 18.9* 22.7* 24.3* 10.6* 9.5  NEUTROABS 13.1*  --   --   --   --   --   HGB 8.3* 8.8* 8.7* 7.5* 8.0* 8.6*  HCT 24.0* 26.1* 26.4* 22.4* 24.2* 25.9*  MCV  83.6 83.9 87.1 85.8 85.5 86.9  PLT 41* 52* 78* 61* 55* 79*    Basic Metabolic Panel: Recent Labs  Lab 04/15/21 1101 04/16/21 0501 04/17/21 0150 04/17/21 1245 04/17/21 1551 04/18/21 0338 04/18/21 1554 04/19/21 0342  NA 135 136 132*  --  135 135 136 136  K 4.0 4.8 4.3 4.1 4.0 3.7 3.1* 3.5  CL 104 105 103  --  100 104 101 104  CO2 '25 23 24  ' --  '24 25 26 26  ' GLUCOSE 175* 123* 180*  --  132* 147* 127* 140*  BUN 39* 76* 72*  --  69* 46* 35* 24*  CREATININE 0.65 1.54* 1.72*  --  1.43* 0.85 0.66 0.65  CALCIUM 8.2* 8.1* 7.7*  --  8.6* 8.8* 9.7 8.5*  MG 2.4 2.5*  --  2.1  --  1.9  --  2.1  PHOS 2.8 4.4 4.9* 5.1* 4.4 3.2 2.8 2.0*   GFR: Estimated Creatinine Clearance: 64.7 mL/min (by C-G formula based on SCr of 0.65 mg/dL). Recent Labs  Lab 04/16/21 0501 04/17/21 0150 04/18/21 0338 04/19/21 0342  PROCALCITON 2.60 4.20 2.16  --   WBC 22.7* 24.3* 10.6* 9.5  LATICACIDVEN 1.1  --   --   --     Liver Function Tests: Recent Labs  Lab 04/15/21 0454 04/15/21 1101 04/17/21 0150 04/17/21 1551 04/18/21 0338 04/18/21 1554 04/19/21 0342  AST 20 22  --   --   --   --   --   ALT 16 17  --   --   --   --   --   ALKPHOS 99 105  --   --   --   --   --   BILITOT 0.9 0.8  --   --   --   --   --   PROT 4.9* 5.0*  --   --   --   --   --   ALBUMIN 2.3*  2.3* 2.2* 1.8* 2.0* 1.9* 2.7* 2.3*   No results for input(s): LIPASE, AMYLASE in the last 168 hours. No results for input(s): AMMONIA in the last 168 hours.  ABG    Component Value Date/Time   PHART 7.38 04/09/2021 0341   PCO2ART 40 04/09/2021 0341   PO2ART 115 (H) 04/09/2021 0341   HCO3 23.7 04/09/2021 0341   ACIDBASEDEF 1.3 04/09/2021 0341   O2SAT 98.4 04/09/2021 0341     Coagulation Profile: Recent Labs  Lab 04/14/21 0450 04/14/21 1344 04/16/21 0501  INR 1.4* 1.3* 4.1*    Cardiac Enzymes: No results for input(s): CKTOTAL, CKMB, CKMBINDEX, TROPONINI in the last 168 hours.  HbA1C: Hgb A1c MFr Bld  Date/Time Value  Ref Range Status  05/04/2021 04:51 PM 5.7 (H) 4.8 - 5.6 % Final    Comment:    (NOTE)         Prediabetes: 5.7 - 6.4         Diabetes: >6.4         Glycemic control for adults with diabetes: <7.0     CBG: Recent Labs  Lab 04/18/21 2335 04/19/21 0003 04/19/21 0030 04/19/21 0122 04/19/21 0338  GLUCAP 58* 307* 234* 166* 129*    Allergies Allergies  Allergen Reactions   Amiodarone     Other reaction(s): Other (See Comments) Nausea, diarrhea, weight loss, anxiety, jitteriness   Levofloxacin Rash    Red palms, lips, forearms    Meloxicam Shortness Of Breath    Other reaction(s): Respiratory Distress   Amlodipine     Other reaction(s): Other (See Comments) Caused severe gingival hyperplasia   Amlodipine Besy-Benazepril Hcl     Severe gingival hyperplasia  Other reaction(s): Other (See Comments) Other reaction(s): Other   Beta Adrenergic Blockers     States severe fatigue  Other reaction(s): Other (See Comments) fatigue   Heparin     HIT workup pending       DVT/GI PRX  assessed I Assessed the need for Labs I Assessed the need for Foley I Assessed the need for Central Venous Line Family Discussion when available I Assessed the need for Mobilization I made an Assessment of medications to be adjusted accordingly Safety Risk assessment completed  CASE DISCUSSED IN MULTIDISCIPLINARY ROUNDS WITH ICU TEAM     Critical Care Time devoted to patient care services described in this note is 65 minutes.  Critical care was necessary to treat or prevent imminent or life-threatening deterioration.   PATIENT WITH VERY POOR PROGNOSIS I ANTICIPATE PROLONGED ICU LOS  Patient with Multiorgan failure and at high risk for cardiac arrest and death.    Corrin Parker, M.D.  [facility]  Medical Director [place] Director [facility] Cardio-Pulmonary Department

## 2021-04-19 NOTE — Progress Notes (Signed)
Chaplain Maggie made initial visit with patient's husband and his son, Lindsay [name]. We talked about how he is meeting resistance from family members about the decisions that need to be made. Patient's husband expressed he is confident in the decision to withdraw care. We spoke about the need for mercy and how difficult it is to made hard decisions. Chaplain available for continued support as needed.

## 2021-04-19 NOTE — Consult Note (Signed)
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 She is status post exploratory laparotomy, hartman's procedure, salpingo-oopherectomy. Hospitalization has been complicated by renal failure requiring CRRT. Patient is with ventilator dependent respiratory failure. She is currently intubated, sedated, and on mechanical ventilation in the ICU. Pharmacy is consulted to assist with electrolyte monitoring and replacement as indicated.  Patient remains on CRRT  Goal of Therapy:  Electrolytes within normal limits  Plan:  --Phosphorous remains at 2.0, order for K phos neutral BID with last dose tonight --Renal function panels BID while on CRRT  Tawnya Crook, PharmD, BCPS 04/19/2021 5:50 PM

## 2021-04-19 NOTE — Progress Notes (Signed)
GOALS OF CARE DISCUSSION  The Clinical status was relayed to family in detail. Husband of Patient Clair Gulling and his Son  Updated and notified of patients medical condition.    Patient remains unresponsive and will not open eyes to command.   Patient is having a weak cough and struggling to remove secretions.   Patient with increased WOB and using accessory muscles to breathe Explained to family course of therapy and the modalities    Patient with Progressive multiorgan failure with a very high probablity of a very minimal chance of meaningful recovery despite all aggressive and optimal medical therapy.  PATIENT REMAINS DNR status  Patient has Multiorgan failure Needs VENT/TRACH to survive Needs HD machine to stay alive  Husband states that patient would NOT want to live like this, She is a very independent and intelligent woman and would NOT want to live on machines and would NOT want to live in [facility] or long term care facilities,   Family understands the situation. The Niece April seems to disagree with what the Husband feels, there is a strained family relationship and dynamics   The Husband would like to pursue comfort care measures I have implemented end of life visiting policy I have encouraged Husband to call all family members of plan of care  All discussions will be with Husband in attendance, theres was a question of his mental capacity. He is alert, Oriented x 4, he completely understands the medical condition that his wife is in,   Family are satisfied with Plan of action and management. All questions answered  Additional CC time 45 mins   Nkechi Linehan Patricia Pesa, M.D.  [facility]  Medical Director [place] Director [facility] Cardio-Pulmonary Department

## 2021-04-20 DIAGNOSIS — N179 Acute kidney failure, unspecified: Secondary | ICD-10-CM | POA: Diagnosis not present

## 2021-04-20 DIAGNOSIS — I421 Obstructive hypertrophic cardiomyopathy: Secondary | ICD-10-CM

## 2021-04-20 DIAGNOSIS — K668 Other specified disorders of peritoneum: Secondary | ICD-10-CM

## 2021-04-20 DIAGNOSIS — J9601 Acute respiratory failure with hypoxia: Secondary | ICD-10-CM | POA: Diagnosis not present

## 2021-04-20 DIAGNOSIS — K631 Perforation of intestine (nontraumatic): Secondary | ICD-10-CM | POA: Diagnosis not present

## 2021-04-20 LAB — RENAL FUNCTION PANEL
Albumin: 2.3 g/dL — ABNORMAL LOW (ref 3.5–5.0)
Albumin: 2.7 g/dL — ABNORMAL LOW (ref 3.5–5.0)
Anion gap: 5 (ref 5–15)
Anion gap: 7 (ref 5–15)
BUN: 18 mg/dL (ref 8–23)
BUN: 17 mg/dL (ref 8–23)
CO2: 27 mmol/L (ref 22–32)
CO2: 26 mmol/L (ref 22–32)
Calcium: 8 mg/dL — ABNORMAL LOW (ref 8.9–10.3)
Calcium: 8 mg/dL — ABNORMAL LOW (ref 8.9–10.3)
Chloride: 105 mmol/L (ref 98–111)
Chloride: 104 mmol/L (ref 98–111)
Creatinine, Ser: 0.65 mg/dL (ref 0.44–1.00)
Creatinine, Ser: 0.46 mg/dL (ref 0.44–1.00)
GFR, Estimated: >60 mL/min (ref >60)
GFR, Estimated: >60 mL/min (ref >60)
Glucose, Bld: 141 mg/dL — ABNORMAL HIGH (ref 70–99)
Glucose, Bld: 178 mg/dL — ABNORMAL HIGH (ref 70–99)
Phosphorus: 2.3 mg/dL — ABNORMAL LOW (ref 2.5–4.6)
Phosphorus: 2.1 mg/dL — ABNORMAL LOW (ref 2.5–4.6)
Potassium: 3.9 mmol/L (ref 3.5–5.1)
Potassium: 3.8 mmol/L (ref 3.5–5.1)
Sodium: 137 mmol/L (ref 135–145)
Sodium: 137 mmol/L (ref 135–145)

## 2021-04-20 LAB — GLUCOSE, CAPILLARY
Glucose-Capillary: 121 mg/dL — ABNORMAL HIGH (ref 70–99)
Glucose-Capillary: 118 mg/dL — ABNORMAL HIGH (ref 70–99)
Glucose-Capillary: 128 mg/dL — ABNORMAL HIGH (ref 70–99)
Glucose-Capillary: 151 mg/dL — ABNORMAL HIGH (ref 70–99)
Glucose-Capillary: 159 mg/dL — ABNORMAL HIGH (ref 70–99)

## 2021-04-20 LAB — CBC
HCT: 24.2 % — ABNORMAL LOW (ref 36.0–46.0)
Hemoglobin: 7.8 g/dL — ABNORMAL LOW (ref 12.0–15.0)
MCH: 27.8 pg (ref 26.0–34.0)
MCHC: 32.2 g/dL (ref 30.0–36.0)
MCV: 86.1 fL (ref 80.0–100.0)
Platelets: 72 10*3/uL — ABNORMAL LOW (ref 150–400)
RBC: 2.81 MIL/uL — ABNORMAL LOW (ref 3.87–5.11)
RDW: 22.5 % — ABNORMAL HIGH (ref 11.5–15.5)
WBC: 7.7 10*3/uL (ref 4.0–10.5)
nRBC: 0.4 % — ABNORMAL HIGH (ref 0.0–0.2)

## 2021-04-20 LAB — FIBRINOGEN: Fibrinogen: 402 mg/dL (ref 210–475)

## 2021-04-20 LAB — MAGNESIUM: Magnesium: 2.3 mg/dL (ref 1.7–2.4)

## 2021-04-20 MED ORDER — PRISMASOL BGK 4/2.5 32-4-2.5 MEQ/L REPLACEMENT SOLN
Status: DC
  Filled 2021-04-20 (×3): qty 5000

## 2021-04-20 MED ORDER — AMIODARONE HCL IN DEXTROSE 360-4.14 MG/200ML-% IV SOLN
30.0000 mg/h | INTRAVENOUS | Status: DC
  Administered 2021-04-20: 30 mg/h via INTRAVENOUS
  Filled 2021-04-20: qty 200

## 2021-04-20 MED ORDER — ACETAMINOPHEN 650 MG RE SUPP: 650.0000 mg | Freq: Four times a day (QID) | RECTAL | Status: DC | PRN

## 2021-04-20 MED ORDER — ACETAMINOPHEN 325 MG PO TABS: 650.0000 mg | ORAL_TABLET | Freq: Four times a day (QID) | ORAL | Status: DC | PRN

## 2021-04-20 MED ORDER — DIPHENHYDRAMINE HCL 50 MG/ML IJ SOLN: 25.0000 mg | INTRAMUSCULAR | Status: DC | PRN

## 2021-04-20 MED ORDER — GLYCOPYRROLATE 1 MG PO TABS
1.0000 mg | ORAL_TABLET | ORAL | Status: DC | PRN
  Filled 2021-04-20: qty 1

## 2021-04-20 MED ORDER — PRISMASOL BGK 4/2.5 32-4-2.5 MEQ/L EC SOLN: Status: DC

## 2021-04-20 MED ORDER — DEXTROSE 5 % IV SOLN: INTRAVENOUS | Status: DC

## 2021-04-20 MED ORDER — LORAZEPAM 2 MG/ML IJ SOLN
2.0000 mg | INTRAMUSCULAR | Status: DC | PRN
Start: 2021-04-20 — End: 2021-04-21
  Administered 2021-04-20: 2 mg via INTRAVENOUS
  Filled 2021-04-20: qty 1

## 2021-04-20 MED ORDER — GLYCOPYRROLATE 0.2 MG/ML IJ SOLN
0.2000 mg | INTRAMUSCULAR | Status: DC | PRN
  Filled 2021-04-20: qty 1

## 2021-04-20 MED ORDER — FENTANYL CITRATE (PF) 100 MCG/2ML IJ SOLN: 50.0000 ug | INTRAMUSCULAR | Status: DC | PRN

## 2021-04-20 MED ORDER — K PHOS MONO-SOD PHOS DI & MONO 155-852-130 MG PO TABS
500.0000 mg | ORAL_TABLET | Freq: Two times a day (BID) | ORAL | Status: DC
  Administered 2021-04-20: 500 mg
  Filled 2021-04-20 (×2): qty 2

## 2021-04-20 MED ORDER — FENTANYL BOLUS VIA INFUSION
100.0000 ug | INTRAVENOUS | Status: DC | PRN
  Filled 2021-04-20: qty 100

## 2021-04-20 MED ORDER — GLYCOPYRROLATE 0.2 MG/ML IJ SOLN
0.2000 mg | INTRAMUSCULAR | Status: DC | PRN
  Administered 2021-04-20: 0.2 mg via INTRAVENOUS
  Filled 2021-04-20: qty 1

## 2021-04-20 MED ORDER — AMIODARONE HCL IN DEXTROSE 360-4.14 MG/200ML-% IV SOLN
60.0000 mg/h | INTRAVENOUS | Status: DC
  Administered 2021-04-20: 60 mg/h via INTRAVENOUS
  Filled 2021-04-20: qty 200

## 2021-04-21 LAB — AEROBIC/ANAEROBIC CULTURE W GRAM STAIN (SURGICAL/DEEP WOUND)
Culture: NO GROWTH
Special Requests: NORMAL

## 2021-05-06 NOTE — Progress Notes (Signed)
Cardiac rhythm getting more arrhythmogenic this afternoon with episodes of  broad complex tachycardia, HR 120's and multi focal PVC's. Restarted 60 mg Amiodarone followed by 30 mg IV as per Dr Mortimer Fries. No pressors needed at this point. Continues on CRRT with no issues and the goal of net even balance. Will continue to monitor.

## 2021-05-06 NOTE — Death Summary Note (Signed)
DEATH SUMMARY   Patient Details  Name: Lindsay [name] MRN: [id number] DOB: 05/04/1946  Admission/Discharge Information   Admit Date:  Apr 14, 2021  Date of Death: Date of Death: 04/27/21  Time of Death: Time of Death: 2054-12-30  Length of Stay: 12-24-22  Referring Physician: Rusty Aus, MD   Reason(s) for Hospitalization  Bowel perforation and severe pneumoperitoneum requiring emergent surgical intervention.  Diagnoses  Preliminary cause of death:   Severe sepsis and septic shock Secondary Diagnoses (including complications and co-morbidities):  Active Problems:   Atrial fibrillation with rapid ventricular response (HCC)   Bowel perforation (HCC)   Peritonitis (HCC)   Severe sepsis with septic shock (CODE) (HCC)   Encounter for continuous renal replacement therapy (CRRT) for acute renal failure (HCC)   Volume depletion   Coagulopathy (HCC)   Acute respiratory failure ([place])   On mechanically assisted ventilation (HCC)   Acute blood loss anemia   Pressure injury of skin   Pneumoperitoneum   HOCM (hypertrophic obstructive cardiomyopathy) [facility])   Brief Hospital Course (including significant findings, care, treatment, and services provided and events leading to death)  Lindsay [name] is a 75 y.o. year old female who presented to the [facility] ED on 2021/04/14 with complaints of weakness and constipation.  Upon arrival she was noted to be tachypneic, pale and sick appearing as well as a diffusely tender abdomen particularly in the lower quadrants.  CT abdomen and pelvis showed bowel perforation with marked inflammatory changes and severe pneumoperitoneum throughout the abdomen and pelvis requiring emergent surgical intervention.  General surgeon Dr. Celine Ahr performed an exploratory laparotomy with mobilization of splenic flexure and extensive adhesion lysis, as well as low anterior resection and descending colostomy as well as left salpingo-oophorectomy and placement of wound VAC.  Dr. Celine Ahr described  that upon entering the abdomen surgically roughly 1.5 L of pus was released without any obvious feculent drainage.  During the procedure a small splenic laceration occurred and was managed with topical hemostatic agents.  Multiple drains placed in the abdomen during procedure.  The patient received volume resuscitation and was transferred to the ICU remaining intubated requiring mechanical ventilation and vasopressor support.  PCCM, cardiology, nephrology, infectious disease, wound care & palliative care consulted to help manage the patient with general surgery following along as well.  Patient was placed on Zosyn and Eraxis for antibiotic coverage, in severe multiorgan failure.  CODE STATUS discussed with family due to overall grave prognosis and patient changed to DNR. On 04/09/2021 CRRT was initiated as the patient was in acute renal failure requiring vasopressor support.  Patient developed A. fib RVR and was started on amiodarone drip, TPN for nutritional support, still requiring Levophed drip and CRRT.  04/12/2021 patient transitioned to fluconazole and meropenem for antibiotic coverage. 04/13/2021 patient weaned off vasopressor support. 04/15/2021 patient has left abdominal fluid collection IR placed drain.  04/16/2021 patient transitioned to HD from CRRT.  04/19/2021 CRRT restarted, patient in multiorgan failure.  On 04/27/21 after discussion with husband and niece patient was transition to comfort measures, extubated and taken off of CRRT support.  Patient passed away at 20:56 with family and chaplain support present.   Pertinent Labs and Studies  Significant Diagnostic Studies CT ABDOMEN PELVIS WO CONTRAST  Result Date: Apr 14, 2021 CLINICAL DATA:  Nonlocalized acute abdominal pain. Nausea and weakness. Decreased urination over the past 4 days EXAM: CT ABDOMEN AND PELVIS WITHOUT CONTRAST TECHNIQUE: Multidetector CT imaging of the abdomen and pelvis was performed following the standard protocol without IV  contrast. COMPARISON:  None. FINDINGS: Lower chest: No acute abnormality. Hepatobiliary: Several similar-appearing fluid density lesions are noted within the liver with the largest measuring up to 2.3 cm. Findings likely represent hepatic cysts. Subcentimeter hypodensities are too small to characterize. No new focal lesion. No gallstones, gallbladder wall thickening, or pericholecystic fluid. No biliary dilatation. Pancreas: No focal lesion. Normal pancreatic contour. No surrounding inflammatory changes. No main pancreatic ductal dilatation. Spleen: Normal in size without focal abnormality. Adrenals/Urinary Tract: No adrenal nodule bilaterally. No nephrolithiasis, no hydronephrosis, and no contour-deforming renal mass. No ureterolithiasis or hydroureter. The urinary bladder is decompressed with a urinary Foley catheter terminating within the lumen. Several foci of gas are noted within the urinary bladder lumen as well as within the Foley catheter. Stomach/Bowel: Stomach is within normal limits. Irregular bowel wall and haziness throughout the abdomen pelvis. Question scattered colonic diverticula of the skin colon. No definite bowel dilatation. The appendix not definitely identified. Vascular/Lymphatic: No abdominal aorta or iliac aneurysm. Severe atherosclerotic plaque of the aorta and its branches. No abdominal, pelvic, or inguinal lymphadenopathy. Reproductive: Uterus and bilateral adnexa are unremarkable. Other: Large volume pneumoperitoneum with at least small volume free fluid mesenteric fat stranding throughout the abdomen and pelvis. Musculoskeletal: No abdominal wall hernia or abnormality. No suspicious lytic or blastic osseous lesions. No acute displaced fracture. Multilevel degenerative changes of the spine. IMPRESSION: 1. Bowel perforation with marked inflammatory changes and pneumoperitoneum throughout the abdomen and pelvis. Bowel ischemia not excluded. Markedly limited evaluation on this noncontrast  study. Recommend emergent surgical consultation. 2. Multiple foci of gas noted within the urinary bladder lumen and Foley catheter tubing. Correlate with urinalysis to exclude infection. These results were called by telephone at the time of interpretation on 04/12/2021 at 5:19 am to provider [facility] , who verbally acknowledged these results. Electronically Signed   By: Iven Finn M.D.   On: 04/18/2021 05:28   CT ABDOMEN PELVIS W CONTRAST  Result Date: 04/14/2021 CLINICAL DATA:  75 year old female with recent bowel perforation and partial colectomy and colostomy. Continued sepsis. Small splenic laceration during surgery. EXAM: CT ABDOMEN AND PELVIS WITH CONTRAST TECHNIQUE: Multidetector CT imaging of the abdomen and pelvis was performed using the standard protocol following bolus administration of intravenous contrast. CONTRAST:  35mL OMNIPAQUE IOHEXOL 300 MG/ML  SOLN COMPARISON:  04/13/2021 CT FINDINGS: Lower chest: Moderate bilateral pleural effusions and bilateral LOWER lung atelectasis noted. Airspace opacities within the lingula and LEFT LOWER lobe are likely represent pneumonia. Cardiomegaly and pacemaker/AICD again noted. Hepatobiliary: Hepatic cysts are again noted without other hepatic abnormality. The patient is status post cholecystectomy. No biliary dilatation identified. Pancreas: Unremarkable Spleen: A 2 cm hypodensity along the posterolateral spleen is noted which may be related to operative injury versus infection. Adrenals/Urinary Tract: The kidneys and adrenal glands are unremarkable. A Foley catheter is present within the bladder. Stomach/Bowel: LEFT sided ostomy is noted. No dilated bowel loops/bowel obstruction noted. There is no evidence of pneumoperitoneum. Mild circumferential small bowel wall enhancement/possible thickening may be reactive. Vascular/Lymphatic: Aortic atherosclerosis. No enlarged abdominal or pelvic lymph nodes. Reproductive: Uterus and bilateral adnexa are  unremarkable. Other: Surgical drains within the abdomen are noted. There is a small to moderate amount of fluid, primarily anteriorly and on the LEFT, which may be loculated and likely represents infected fluid. Other scattered areas of ill-defined fluid within the abdomen and pelvis noted, some adjacent to small bowel loops. No gross pneumoperitoneum is identified. Diffuse subcutaneous edema is noted. Musculoskeletal: No acute or suspicious  bony abnormalities identified. IMPRESSION: 1. Small to moderate amount of fluid within the abdomen and pelvis, primarily anteriorly and on the LEFT, which may be loculated and probably represent infected fluid. Other scattered areas of ill-defined fluid within the abdomen and pelvis, some adjacent to small bowel loops. No gross pneumoperitoneum. 2. Airspace opacities within the lingula and LEFT LOWER lobe likely represent pneumonia. 3. Moderate bilateral pleural effusions and bilateral LOWER lung atelectasis. 4. 2 cm hypodensity along the posterolateral spleen which may be related to operative injury versus infection. 5. Mild small bowel wall thickening/enhancement which may be reactive. No evidence of bowel obstruction or pneumoperitoneum. 6. Aortic Atherosclerosis (ICD10-I70.0). Electronically Signed   By: Margarette Canada M.D.   On: 04/14/2021 16:14   US Venous Img Lower Bilateral (DVT)  Result Date: 04/17/2021 CLINICAL DATA:  Edema EXAM: BILATERAL LOWER EXTREMITY VENOUS DOPPLER ULTRASOUND TECHNIQUE: Gray-scale sonography with compression, as well as color and duplex ultrasound, were performed to evaluate the deep venous system(s) from the level of the common femoral vein through the popliteal and proximal calf veins. COMPARISON:  None. FINDINGS: VENOUS Normal compressibility of the common femoral, superficial femoral, and popliteal veins, as well as the visualized calf veins. Visualized portions of profunda femoral vein and great saphenous vein unremarkable. No filling  defects to suggest DVT on grayscale or color Doppler imaging. Doppler waveforms show normal direction of venous flow, normal respiratory phasicity and response to augmentation. OTHER None. Limitations: none IMPRESSION: No femoropopliteal DVT nor evidence of DVT within the visualized calf veins. If clinical symptoms are inconsistent or if there are persistent or worsening symptoms, further imaging (possibly involving the iliac veins) may be warranted. Electronically Signed   By: Lucrezia Europe M.D.   On: 04/17/2021 14:05   DG Chest Port 1 View  Result Date: 04/16/2021 CLINICAL DATA:  Follow-up pneumonia.  Ventilator support. EXAM: PORTABLE CHEST 1 VIEW COMPARISON:  04/15/2021 FINDINGS: Endotracheal tube tip 3.5 cm above the carina. Orogastric or nasogastric tube enters the stomach. Right internal jugular central line tip in the SVC probably extends through the right atrium to the upper IVC. Left internal jugular central line tip extends through the right atrium to the upper IVC. Pacemaker/AICD appears unchanged. Persistent infiltrate and volume loss in the mid and lower lungs, left worse than right. No worsening or new finding. IMPRESSION: Persistent infiltrate and volume loss in the mid and lower lungs, left worse than right. Lines and tubes appear similar. Left internal jugular central line and right internal jugular central line extend through the right atrium to the upper IVC. Electronically Signed   By: Nelson Chimes M.D.   On: 04/16/2021 11:11   DG Chest Port 1 View  Result Date: 04/15/2021 CLINICAL DATA:  ETT placement. EXAM: PORTABLE CHEST 1 VIEW COMPARISON:  04/08/2021 FINDINGS: 1124 hours. Endotracheal tube tip is 4.8 cm above the base of the carina. The NG tube passes into the stomach although the distal tip position is not included on the film. Left IJ central line tip overlies the lower right atrium given leftward patient rotation. Right IJ central line tip also noted region of lower right atrium.  Interval progression of vascular congestion with worsening bibasilar atelectasis or infiltrate. Small bilateral pleural effusions noted. Surgical drain identified left upper abdomen. IMPRESSION: 1. Interval progression of vascular congestion with worsening bibasilar atelectasis or infiltrate. 2. Small bilateral pleural effusions. 3. Stable support apparatus. Electronically Signed   By: Misty Stanley M.D.   On: 04/15/2021 11:46   DG  Chest Port 1 View  Result Date: 04/08/2021 CLINICAL DATA:  Central line placement. EXAM: PORTABLE CHEST 1 VIEW COMPARISON:  Radiograph earlier today. FINDINGS: Large-bore left-sided internal jugular catheter is low in positioning at the atrial IVC junction, at or possibly below the diaphragm. No pneumothorax. Right internal central line, endotracheal tube, enteric tube, left-sided pacemaker remain in place. Stable heart size and mediastinal contours. Mild hazy left lung base opacity likely small pleural effusion and atelectasis. Presumed surgical drain in the left upper quadrant. No pulmonary edema. IMPRESSION: 1. Left internal jugular central line with tip low in positioning at the atrial IVC junction, either at or possibly below the diaphragm. No pneumothorax. 2. Remaining support apparatus are stable. 3. Hazy left lung base opacity likely small pleural effusion and atelectasis. Electronically Signed   By: Keith Rake M.D.   On: 04/08/2021 16:54   Portable Chest xray  Result Date: 04/08/2021 CLINICAL DATA:  Respiratory failure. History of diabetes and hypertension. Follow-up exam. EXAM: PORTABLE CHEST 1 VIEW COMPARISON:  04/11/2021. FINDINGS: Cardiac silhouette normal in size.  No mediastinal or hilar masses. Stable prominent bronchovascular markings bilaterally. No convincing pneumonia or pulmonary edema. Endotracheal tube, nasal/orogastric tube, right internal jugular central venous catheter and left anterior chest wall AICD are all stable. IMPRESSION: 1. No acute  cardiopulmonary disease. 2. Support apparatus is stable and well positioned. Electronically Signed   By: Lajean Manes M.D.   On: 04/08/2021 07:46   DG Chest Port 1 View  Result Date: 04/11/2021 CLINICAL DATA:  Respiratory failure. EXAM: PORTABLE CHEST 1 VIEW COMPARISON:  May 30, 2020. FINDINGS: The heart size and mediastinal contours are within normal limits. Both lungs are clear. Endotracheal and nasogastric tubes are in good position. Left-sided pacemaker is unchanged in position. Right internal jugular catheter is in good position. The visualized skeletal structures are unremarkable. IMPRESSION: No active disease. Aortic Atherosclerosis (ICD10-I70.0). Electronically Signed   By: Marijo Conception M.D.   On: 05/02/2021 13:48   ECHOCARDIOGRAM COMPLETE  Result Date: 04/08/2021    ECHOCARDIOGRAM REPORT   Patient Name:   MAYCEL RIFFE Date of Exam: 04/08/2021 Medical Rec #:  [id number]     Height:       65.0 in Accession #:    [id number]    Weight:       140.0 lb Date of Birth:  [birth year]    BSA:          1.700 m Patient Age:    22 years      BP:           144/86 mmHg Patient Gender: F             HR:           112 bpm. Exam Location:  [facility] Procedure: 2D Echo Indications:     Hypertrophic Obstructive Cardiomyopathy  History:         Patient has no prior history of Echocardiogram examinations.                  Arrythmias:Atrial Fibrillation.  Sonographer:     Carlean Jews Thornton-Maynard Referring Phys:  [id number] [address] Pita Diagnosing Phys: Bartholome Bill MD IMPRESSIONS  1. Left ventricular ejection fraction, by estimation, is 65 to 70%. The left ventricle has normal function. The left ventricle has no regional wall motion abnormalities. There is moderate asymmetric left ventricular hypertrophy of the basal-septal segment. Left ventricular diastolic parameters were normal.  2. Right ventricular systolic function is normal. The right ventricular  size is normal. There is normal pulmonary artery systolic pressure.  3. Left  atrial size was mildly dilated.  4. Right atrial size was mildly dilated.  5. A small pericardial effusion is present. The pericardial effusion is posterior to the left ventricle and the left atrium. There is no evidence of cardiac tamponade.  6. The mitral valve is grossly normal. Moderate mitral valve regurgitation.  7. The aortic valve was not well visualized. Aortic valve regurgitation is trivial. FINDINGS  Left Ventricle: Left ventricular ejection fraction, by estimation, is 65 to 70%. The left ventricle has normal function. The left ventricle has no regional wall motion abnormalities. The left ventricular internal cavity size was normal in size. There is  moderate asymmetric left ventricular hypertrophy of the basal-septal segment. Left ventricular diastolic parameters were normal. Right Ventricle: The right ventricular size is normal. No increase in right ventricular wall thickness. Right ventricular systolic function is normal. There is normal pulmonary artery systolic pressure. The tricuspid regurgitant velocity is 2.38 m/s, and  with an assumed right atrial pressure of 3 mmHg, the estimated right ventricular systolic pressure is 78.5 mmHg. Left Atrium: Left atrial size was mildly dilated. Right Atrium: Right atrial size was mildly dilated. Pericardium: A small pericardial effusion is present. The pericardial effusion is posterior to the left ventricle and the left atrium. There is no evidence of cardiac tamponade. Mitral Valve: The mitral valve is grossly normal. Moderate mitral valve regurgitation. MV peak gradient, 6.5 mmHg. The mean mitral valve gradient is 3.0 mmHg. Tricuspid Valve: The tricuspid valve is grossly normal. Tricuspid valve regurgitation is mild. Aortic Valve: The aortic valve was not well visualized. Aortic valve regurgitation is trivial. Aortic valve mean gradient measures 4.0 mmHg. Aortic valve peak gradient measures 6.6 mmHg. Aortic valve area, by VTI measures 2.50 cm. Pulmonic Valve:  The pulmonic valve was not well visualized. Pulmonic valve regurgitation is trivial. Aorta: The aortic root is normal in size and structure. IAS/Shunts: The atrial septum is grossly normal. Additional Comments: A device lead is visualized.  LEFT VENTRICLE PLAX 2D LVIDd:         2.29 cm  Diastology LVIDs:         1.54 cm  LV e' medial:    5.33 cm/s LV PW:         1.84 cm  LV E/e' medial:  22.6 LV IVS:        2.19 cm  LV e' lateral:   6.74 cm/s LVOT diam:     2.00 cm  LV E/e' lateral: 17.9 LV SV:         52 LV SV Index:   30 LVOT Area:     3.14 cm  RIGHT VENTRICLE RV S prime:     8.59 cm/s LEFT ATRIUM             Index       RIGHT ATRIUM           Index LA diam:        4.30 cm 2.53 cm/m  RA Area:     20.10 cm LA Vol (A2C):   80.4 ml 47.30 ml/m RA Volume:   54.80 ml  32.24 ml/m LA Vol (A4C):   97.8 ml 57.53 ml/m LA Biplane Vol: 88.8 ml 52.24 ml/m  AORTIC VALVE                   PULMONIC VALVE AV Area (Vmax):    2.82 cm    PV Vmax:  1.14 m/s AV Area (Vmean):   2.67 cm    PV Peak grad:  5.2 mmHg AV Area (VTI):     2.50 cm AV Vmax:           128.00 cm/s AV Vmean:          89.700 cm/s AV VTI:            0.207 m AV Peak Grad:      6.6 mmHg AV Mean Grad:      4.0 mmHg LVOT Vmax:         115.00 cm/s LVOT Vmean:        76.200 cm/s LVOT VTI:          0.165 m LVOT/AV VTI ratio: 0.80  AORTA Ao Root diam: 2.80 cm MITRAL VALVE                 TRICUSPID VALVE MV Area (PHT): 2.86 cm      TR Peak grad:   22.7 mmHg MV Area VTI:   2.73 cm      TR Vmax:        238.00 cm/s MV Peak grad:  6.5 mmHg MV Mean grad:  3.0 mmHg      SHUNTS MV Vmax:       1.27 m/s      Systemic VTI:  0.16 m MV Vmean:      86.2 cm/s     Systemic Diam: 2.00 cm MR Peak grad:    69.2 mmHg MR Mean grad:    41.0 mmHg MR Vmax:         416.00 cm/s MR Vmean:        298.0 cm/s MR PISA:         3.08 cm MR PISA Eff ROA: 51 mm MR PISA Radius:  0.70 cm MV E velocity: 120.33 cm/s Bartholome Bill MD Electronically signed by Bartholome Bill MD Signature Date/Time:  04/08/2021/3:42:55 PM    Final    CT IMAGE GUIDED DRAINAGE BY PERCUTANEOUS CATHETER  Result Date: 04/15/2021 INDICATION: 75 year old female status post perforated colon status post exploratory laparotomy. Residual abdominal fluid resembling ascites with possible mild loculation seen on CT imaging. Drain placement requested. EXAM: CT-guided drain placement MEDICATIONS: The patient is currently admitted to the hospital and receiving intravenous antibiotics. The antibiotics were administered within an appropriate time frame prior to the initiation of the procedure. ANESTHESIA/SEDATION: None. COMPLICATIONS: None immediate. PROCEDURE: Informed written consent was obtained from the patient after a thorough discussion of the procedural risks, benefits and alternatives. All questions were addressed. Maximal Sterile Barrier Technique was utilized including caps, mask, sterile gowns, sterile gloves, sterile drape, hand hygiene and skin antiseptic. A timeout was performed prior to the initiation of the procedure. A planning axial CT scan was performed. The fluid layering along the left pericolic gutter was localized. The skin was marked. The overlying skin was then sterilely prepped and draped in standard fashion using chlorhexidine skin prep. Local anesthesia was attained by infiltration with 1% lidocaine. A small dermatotomy was made. Under intermittent CT guidance, an 18 gauge trocar needle was advanced into the fluid. A 0.035 wire was then advanced into the fluid. The needle was removed. The subcutaneous tract was dilated to 10 [place]. A Cook 10 [place] all-purpose drainage catheter was advanced over the wire and formed in the fluid. Aspiration yields mildly turbid serosanguineous ascites. Several 100 cc of fluid was aspirated. Follow-up CT imaging demonstrates a well-positioned drainage catheter in the expected location. The  catheter was secured to the skin with 0 Prolene suture and connected to JP bulb suction.  IMPRESSION: Successful placement of 10 French drainage catheter. Evacuation fluid is serosanguineous and mildly turbid consistent with ascites. Samples were sent for culture. Electronically Signed   By: Jacqulynn Cadet M.D.   On: 04/15/2021 16:55    Microbiology Recent Results (from the past 240 hour(s))  Aerobic/Anaerobic Culture w Gram Stain (surgical/deep wound)     Status: None (Preliminary result)   Collection Time: 04/15/21  4:21 PM   Specimen: Abdomen; Abdominal Fluid  Result Value Ref Range Status   Specimen Description   Final    ABDOMEN Performed at [facility], [address], [place], [place] [address]    Special Requests   Final    Normal Performed at [facility], [place], [place] [address]    Gram Stain   Final    RARE WBC PRESENT,BOTH PMN AND MONONUCLEAR NO ORGANISMS SEEN    Culture   Final    NO GROWTH 4 DAYS NO ANAEROBES ISOLATED; CULTURE IN PROGRESS FOR 5 DAYS Performed at [facility], [address], [place], [place] [address]    Report Status PENDING  Incomplete  Culture, Respiratory w Gram Stain     Status: None   Collection Time: 04/16/21  8:08 AM   Specimen: Tracheal Aspirate; Respiratory  Result Value Ref Range Status   Specimen Description   Final    TRACHEAL ASPIRATE Performed at [facility], [address] [place], [place] [address]    Special Requests   Final    NONE Performed at [facility], [address] [place], [place] [address]    Gram Stain   Final    ABUNDANT WBC PRESENT,BOTH PMN AND MONONUCLEAR RARE BUDDING YEAST SEEN Performed at [facility], [place] [address], [place], [place] [address]    Culture FEW CANDIDA PARAPSILOSIS  Final   Report Status 04/19/2021 FINAL  Final    Lab Basic Metabolic Panel: Recent Labs  Lab 04/16/21 0501 04/17/21 0150 04/17/21 1245 04/17/21 1551 04/18/21 0338 04/18/21 1554 04/19/21 0342 04/19/21 1600 23-Apr-2021 0459  04-23-21 1600  NA 136   < >  --    < > 135 136 136 135 137 137  K 4.8   < > 4.1   < > 3.7 3.1* 3.5 3.7 3.9 3.8  CL 105   < >  --    < > 104 101 104 102 105 104  CO2 23   < >  --    < > 25 26 26 27 27 26   GLUCOSE 123*   < >  --    < > 147* 127* 140* 142* 141* 178*  BUN 76*   < >  --    < > 46* 35* 24* 20 18 17   CREATININE 1.54*   < >  --    < > 0.85 0.66 0.65 0.69 0.65 0.46  CALCIUM 8.1*   < >  --    < > 8.8* 9.7 8.5* 8.5* 8.0* 8.0*  MG 2.5*  --  2.1  --  1.9  --  2.1  --  2.3  --   PHOS 4.4   < > 5.1*   < > 3.2 2.8 2.0* 2.0* 2.3* 2.1*   < > = values in this interval not displayed.   Liver Function Tests: Recent Labs  Lab 04/15/21 0454 04/15/21 1101 04/17/21 0150 04/18/21  1554 04/19/21 0342 04/19/21 1600 2021-04-30 0459 04/30/2021 1600  AST 20 22  --   --   --   --   --   --   ALT 16 17  --   --   --   --   --   --   ALKPHOS 99 105  --   --   --   --   --   --   BILITOT 0.9 0.8  --   --   --   --   --   --   PROT 4.9* 5.0*  --   --   --   --   --   --   ALBUMIN 2.3*  2.3* 2.2*   < > 2.7* 2.3* 2.7* 2.3* 2.7*   < > = values in this interval not displayed.   No results for input(s): LIPASE, AMYLASE in the last 168 hours. No results for input(s): AMMONIA in the last 168 hours. CBC: Recent Labs  Lab 04/15/21 0454 04/15/21 1101 04/16/21 0501 04/17/21 0150 04/18/21 0338 04/19/21 0342 04/30/2021 0459  WBC 14.4*   < > 22.7* 24.3* 10.6* 9.5 7.7  NEUTROABS 13.1*  --   --   --   --   --   --   HGB 8.3*   < > 8.7* 7.5* 8.0* 8.6* 7.8*  HCT 24.0*   < > 26.4* 22.4* 24.2* 25.9* 24.2*  MCV 83.6   < > 87.1 85.8 85.5 86.9 86.1  PLT 41*   < > 78* 61* 55* 79* 72*   < > = values in this interval not displayed.   Cardiac Enzymes: No results for input(s): CKTOTAL, CKMB, CKMBINDEX, TROPONINI in the last 168 hours. Sepsis Labs: Recent Labs  Lab 04/16/21 0501 04/17/21 0150 04/18/21 0338 04/19/21 0342 30-Apr-2021 0459  PROCALCITON 2.60 4.20 2.16  --   --   WBC 22.7* 24.3* 10.6* 9.5 7.7   LATICACIDVEN 1.1  --   --   --   --     Procedures/Operations  04/25/2021- exploratory laparotomy, mobilization of splenic flexure, extensive adhesion lysis, low anterior resection, descending colostomy, left salpingo-oophorectomy and placement of wound VAC  04/10/2021- ETT placement 05/05/2021-CVC placement 04/25/2021-arterial line placement >> 04/18/2021 04/08/2021 HD catheter placement  04/15/2021 drain placed by IR 04/18/2021-arterial line placement   Domingo Pulse Rust-Chester, AGACNP-BC Acute Care Nurse Practitioner [facility]   [phone] / [phone] Please see Amion for pager details.   [place] Deleo L Rust-Chester 04/30/2021, 9:33 PM

## 2021-05-06 NOTE — Progress Notes (Signed)
  Chaplain On-Call responded to call at 7:38 PM from Unit Secretary, who reported that the patient's family has gathered at bedside, and requested Chaplain support.  Chaplain received medical update from [employer], and met the family, including the patient's husband, brother, nephew and niece.  Chaplain provided supportive listening and presence as the family described this difficult hospitalization for the patient, and their feelings through this challenging time.  Chaplain provided spiritual and emotional support, and prayer for comfort as the family has decided to withdraw mechanical support and allow comfort care measures for the patient.  Chaplain accompanied family as the patient was extubated, and in their return to the room.  Chaplain Pollyann Samples M.Div., [facility]

## 2021-05-06 NOTE — Progress Notes (Signed)
NAME:  Lindsay [name], MRN:  [id number], DOB:  [birth year], LOS: 37 ADMISSION DATE:  04/25/2021  BRIEF SYNOPSIS  75 yo F admitted with abdominal pain and sepsis with perforated viscus.  She was taken to OR s/o GI surgery with resection and ostomy removal of 1.5L of pus.  Drainage grew resistant Klebsiella.   Patient admitted to [facility] for septic shock due to intraabdominal infection.    Significant Hospital Events: Including procedures, antibiotic start and stop dates in addition to other pertinent events    04/27/2021 admitted post emergent ex lap for perforated viscus, peritonitis, severe sepsis 04/05/2021 piperacillin tazobactam and Eraxis initiated 7/4 remains critically ill, severe multiorgan failure 7/5 remains critically ill, severe shock, on CRRT 7/6 remains on vent, pressors and CRRT 7/7 remains on CRRT, on pressors, on vent 04/12/21- patient is critically ill on levophed, TPN, mechanical ventilation, dialysis, abdominal surgical wound with 2 JP drains actively draining, midline surgical wound ,  ostomy with active stooling, acrocyanosis, comatose in [place].  Plan to start PO feeds.  04/13/21- patient improved slighlty and weaned off all vasopressor.  Met with husband and reviwed care plan.  04/14/21 - patient has mild neurologic recovery with improved GCS score.  Plan to transfuse blood today and repeat abdominal imaging. 04/15/21- patient has left abd fluid collection. I spoke with patient regarding goals of care.  He wants procedure done and to continue medical management for now.  Reviewed procedure with IR - radiologist thinks it will be helpful to remove infected fluid from abdominal cavity.   She is off all vasorpressors 04/16/21- patient had full HD today removed 1.8 L she did well.  I met with husband and reviewed care plan.  Patient remains severely critically ill.  He is appreciative of care and thankful to entire medical team. 04/18/21- I met with family today.  I discussed in detail  with husband (he takes numerous central acting medications and has delayed sleep phase cycle with chronic pain syndrome and possible dementia) he does not have capacity to make decisions but Niece is listed as alternate POA and she is available to meet.  She wishes to continue full scope of therapy.  7/15 Restarted CRRT 7/16 multiorgan failure, family witnseed suffering and failed weaning trials   Antimicrobials:   Antibiotics Given (last 72 hours)     Date/Time Action Medication Dose Rate   04/17/21 1423 New Bag/Given   meropenem (MERREM) 1 g in sodium chloride 0.9 % 100 mL IVPB 1 g 200 mL/hr   04/17/21 2205 New Bag/Given   meropenem (MERREM) 1 g in sodium chloride 0.9 % 100 mL IVPB 1 g 200 mL/hr   04/18/21 0526 New Bag/Given   meropenem (MERREM) 1 g in sodium chloride 0.9 % 100 mL IVPB 1 g 200 mL/hr   04/18/21 1344 New Bag/Given   meropenem (MERREM) 1 g in sodium chloride 0.9 % 100 mL IVPB 1 g 200 mL/hr   04/18/21 2201 New Bag/Given   meropenem (MERREM) 1 g in sodium chloride 0.9 % 100 mL IVPB 1 g 200 mL/hr   04/19/21 0504 New Bag/Given   meropenem (MERREM) 1 g in sodium chloride 0.9 % 100 mL IVPB 1 g 200 mL/hr   04/19/21 1319 New Bag/Given   Ampicillin-Sulbactam (UNASYN) 3 g in sodium chloride 0.9 % 100 mL IVPB 3 g 200 mL/hr   04/19/21 2125 New Bag/Given   Ampicillin-Sulbactam (UNASYN) 3 g in sodium chloride 0.9 % 100 mL IVPB 3 g 200 mL/hr  May 08, 2021 0511 New Bag/Given   Ampicillin-Sulbactam (UNASYN) 3 g in sodium chloride 0.9 % 100 mL IVPB 3 g 200 mL/hr            Interim History / Subjective:  Remains critically ill Severe renal failure and resp failure Failure to wean from CRRT and VENT Open ABD with wound vac          Objective   Blood pressure 133/66, pulse (!) 51, temperature (!) 94.1 F (34.5 C), resp. rate 20, height '5\' 5"'  (1.651 m), weight 84.2 kg, SpO2 100 %.    Vent Mode: PRVC FiO2 (%):  [40 %] 40 % Set Rate:  [20 bmp] 20 bmp Vt Set:  [450 mL]  450 mL PEEP:  [5 cmH20] 5 cmH20 Plateau Pressure:  [13 cmH20-24 cmH20] 23 cmH20   Intake/Output Summary (Last 24 hours) at 2021/05/08 0719 Last data filed at 2021/05/08 0700 Gross per 24 hour  Intake 2938.96 ml  Output 3181 ml  Net -242.04 ml    Filed Weights   04/18/21 0331 04/19/21 0331 05-08-2021 0500  Weight: 82 kg 83.3 kg 84.2 kg    REVIEW OF SYSTEMS  PATIENT IS UNABLE TO PROVIDE COMPLETE REVIEW OF SYSTEMS DUE TO SEVERE CRITICAL ILLNESS AND TOXIC METABOLIC ENCEPHALOPATHY  PHYSICAL EXAMINATION:  GENERAL:critically ill appearing, +resp distress HEAD: Normocephalic, atraumatic.  EYES: Pupils equal, round, reactive to light.  No scleral icterus.  MOUTH: Moist mucosal membrane. NECK: Supple.  PULMONARY: +rhonchi, +wheezing CARDIOVASCULAR: S1 and S2. Regular rate and rhythm. No murmurs, rubs, or gallops.  MUSCULOSKELETAL: No swelling, clubbing, or edema.  NEUROLOGIC: obtunded Gastrointestinal: Soft, unable to assess tenderness; non-distended, no rebound/guarding. 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 Newly placed drain in left abdomen; serous  SKIN Midline wound, measuring 21 x 6 x 3 cm, Wound vac in place    Labs/imaging that I havepersonally reviewed  (right click and "Reselect all SmartList Selections" daily)      ASSESSMENT AND PLAN SYNOPSIS   75 yo female with severe bowel perforation SEPSIS PRESENT ON ADMISSION and perforated ABD with ABD abscess with severe septic shock s/p ex lap with progressive multiorgan failure Failure to wean from vent and failure to wean from CRRT, post op severe resp failure due to sepsis and abd distention   Severe ACUTE Hypoxic and Hypercapnic Respiratory Failure -continue Mechanical Ventilator support -continue Bronchodilator Therapy -Wean Fio2 and PEEP as tolerated -VAP/VENT bundle implementation Failure to wean from vent, she will need TRACH to  survive   Vent Mode: PRVC FiO2 (%):  [40 %] 40 % Set Rate:  [20 bmp] 20 bmp Vt Set:  [450 mL] 450 mL PEEP:  [5 cmH20] 5 cmH20 Plateau Pressure:  [13 cmH20-24 cmH20] 23 cmH20  CARDIAC FAILURE- acute MAT, Afib -follow up cardiac enzymes as indicated -follow up cardiology recs AMIO as needed   CARDIAC ICU monitoring  ACUTE KIDNEY INJURY/Renal Failure due to SEPTIC SHOCK ATN -continue Foley Catheter-assess need -Avoid nephrotoxic agents -Follow urine output, BMP -Ensure adequate renal perfusion, optimize oxygenation -Renal dose medications On CRRT   Intake/Output Summary (Last 24 hours) at 05-08-21 2811 Last data filed at May 08, 2021 0700 Gross per 24 hour  Intake 2938.96 ml  Output 3181 ml  Net -242.04 ml     NEUROLOGY Acute toxic metabolic encephalopathy, need for sedation Goal RASS -2 to -3  SEPTIC shock SOURCE-ABD PERF -use vasopressors to keep MAP>65 as needed -follow ABG and LA  as needed -follow up cultures -emperic ABX  INFECTIOUS DISEASE -continue antibiotics as prescribed -follow up cultures -follow up ID consultation    GI GI PROPHYLAXIS as indicated  NUTRITIONAL STATUS DIET-->ON TF's Constipation protocol as indicated   ELECTROLYTES -follow labs as needed -replace as needed -pharmacy consultation and following   Best practice (right click and "Reselect all SmartList Selections" daily)  Diet:  NPO Pain/Anxiety/Delirium protocol (if indicated): Yes (RASS goal -2) VAP protocol (if indicated): Yes DVT prophylaxis: Subcutaneous Heparin GI prophylaxis: PPI Glucose control:  SSI Yes Central venous access:  Yes, and it is still needed Arterial line:  Yes, and it is still needed Foley:  Yes, and it is still needed Mobility:  bed rest  PT consulted: N/A Code Status:  DNR Disposition: ICU    Labs   CBC: Recent Labs  Lab 04/15/21 0454 04/15/21 1101 04/16/21 0501 04/17/21 0150 04/18/21 0338 04/19/21 0342 17-May-2021 0459  WBC  14.4*   < > 22.7* 24.3* 10.6* 9.5 7.7  NEUTROABS 13.1*  --   --   --   --   --   --   HGB 8.3*   < > 8.7* 7.5* 8.0* 8.6* 7.8*  HCT 24.0*   < > 26.4* 22.4* 24.2* 25.9* 24.2*  MCV 83.6   < > 87.1 85.8 85.5 86.9 86.1  PLT 41*   < > 78* 61* 55* 79* 72*   < > = values in this interval not displayed.     Basic Metabolic Panel: Recent Labs  Lab 04/16/21 0501 04/17/21 0150 04/17/21 1245 04/17/21 1551 04/18/21 0338 04/18/21 1554 04/19/21 0342 04/19/21 1600 2021/05/17 0459  NA 136   < >  --    < > 135 136 136 135 137  K 4.8   < > 4.1   < > 3.7 3.1* 3.5 3.7 3.9  CL 105   < >  --    < > 104 101 104 102 105  CO2 23   < >  --    < > '25 26 26 27 27  ' GLUCOSE 123*   < >  --    < > 147* 127* 140* 142* 141*  BUN 76*   < >  --    < > 46* 35* 24* 20 18  CREATININE 1.54*   < >  --    < > 0.85 0.66 0.65 0.69 0.65  CALCIUM 8.1*   < >  --    < > 8.8* 9.7 8.5* 8.5* 8.0*  MG 2.5*  --  2.1  --  1.9  --  2.1  --  2.3  PHOS 4.4   < > 5.1*   < > 3.2 2.8 2.0* 2.0* 2.3*   < > = values in this interval not displayed.    GFR: Estimated Creatinine Clearance: 65.1 mL/min (by C-G formula based on SCr of 0.65 mg/dL). Recent Labs  Lab 04/16/21 0501 04/17/21 0150 04/18/21 0338 04/19/21 0342 May 17, 2021 0459  PROCALCITON 2.60 4.20 2.16  --   --   WBC 22.7* 24.3* 10.6* 9.5 7.7  LATICACIDVEN 1.1  --   --   --   --      Liver Function Tests: Recent Labs  Lab 04/15/21 0454 04/15/21 1101 04/17/21 0150 04/18/21 0338 04/18/21 1554 04/19/21 0342 04/19/21 1600 May 17, 2021 0459  AST 20 22  --   --   --   --   --   --   ALT 16 17  --   --   --   --   --   --  ALKPHOS 99 105  --   --   --   --   --   --   BILITOT 0.9 0.8  --   --   --   --   --   --   PROT 4.9* 5.0*  --   --   --   --   --   --   ALBUMIN 2.3*  2.3* 2.2*   < > 1.9* 2.7* 2.3* 2.7* 2.3*   < > = values in this interval not displayed.    No results for input(s): LIPASE, AMYLASE in the last 168 hours. No results for input(s): AMMONIA in the  last 168 hours.  ABG    Component Value Date/Time   PHART 7.38 04/09/2021 0341   PCO2ART 40 04/09/2021 0341   PO2ART 115 (H) 04/09/2021 0341   HCO3 23.7 04/09/2021 0341   ACIDBASEDEF 1.3 04/09/2021 0341   O2SAT 98.4 04/09/2021 0341      Coagulation Profile: Recent Labs  Lab 04/14/21 0450 04/14/21 1344 04/16/21 0501  INR 1.4* 1.3* 4.1*     Cardiac Enzymes: No results for input(s): CKTOTAL, CKMB, CKMBINDEX, TROPONINI in the last 168 hours.  HbA1C: Hgb A1c MFr Bld  Date/Time Value Ref Range Status  04/14/2021 04:51 PM 5.7 (H) 4.8 - 5.6 % Final    Comment:    (NOTE)         Prediabetes: 5.7 - 6.4         Diabetes: >6.4         Glycemic control for adults with diabetes: <7.0     CBG: Recent Labs  Lab 04/19/21 1252 04/19/21 1718 04/19/21 1915 04/19/21 2307 23-Apr-2021 0321  GLUCAP 151* 130* 106* 99 121*     Allergies Allergies  Allergen Reactions   Amiodarone     Other reaction(s): Other (See Comments) Nausea, diarrhea, weight loss, anxiety, jitteriness   Levofloxacin Rash    Red palms, lips, forearms    Meloxicam Shortness Of Breath    Other reaction(s): Respiratory Distress   Amlodipine     Other reaction(s): Other (See Comments) Caused severe gingival hyperplasia   Amlodipine Besy-Benazepril Hcl     Severe gingival hyperplasia  Other reaction(s): Other (See Comments) Other reaction(s): Other   Beta Adrenergic Blockers     States severe fatigue  Other reaction(s): Other (See Comments) fatigue        DVT/GI PRX  assessed I Assessed the need for Labs I Assessed the need for Foley I Assessed the need for Central Venous Line Family Discussion when available I Assessed the need for Mobilization I made an Assessment of medications to be adjusted accordingly Safety Risk assessment completed  CASE DISCUSSED IN MULTIDISCIPLINARY ROUNDS WITH ICU TEAM     Critical Care Time devoted to patient care services described in this note is 55 minutes.   Critical care was necessary to treat /prevent imminent and life-threatening deterioration. Overall, patient is critically ill, prognosis is guarded.  Patient with Multiorgan failure and at high risk for cardiac arrest and death.   PATIENT WITH VERY POOR PROGNOSIS I ANTICIPATE PROLONGED ICU LOS  Corrin Parker, M.D.  [facility]  Medical Director [facility] Director [facility] Cardio-Pulmonary Department

## 2021-05-06 NOTE — Progress Notes (Signed)
Patient is expired. Patient is without respirations, pupils fixed and non-reactive, no palpable pulse and no heart sounds. NP notified. AD notified.

## 2021-05-06 NOTE — Consult Note (Signed)
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 She is status post exploratory laparotomy, hartman's procedure, salpingo-oopherectomy. Hospitalization has been complicated by renal failure requiring CRRT. Patient is with ventilator dependent respiratory failure. She is currently intubated, sedated, and on mechanical ventilation in the ICU. Pharmacy is consulted to assist with electrolyte monitoring and replacement as indicated.  Patient remains on CRRT  Goal of Therapy:  Electrolytes within normal limits  Plan:  --Phosphorous 2.3, will give K Phos Neutral 500 mg BID x 2 doses --Renal function panels BID while on CRRT  Benita Gutter 2021-04-30 7:17 AM

## 2021-05-06 NOTE — Progress Notes (Signed)
RT at bedside.

## 2021-05-06 NOTE — Progress Notes (Signed)
NP at bedside.

## 2021-05-06 NOTE — Progress Notes (Signed)
Family is at bedside. Nephew is in from [place]. Decision made by family to transition to comfort care. NP notified. Chaplin notified.

## 2021-05-06 NOTE — Progress Notes (Signed)
[facility]  ROUNDING NOTE   Subjective:   Patient continues on CRRT at this time. Remains critically ill. Patient is unable to offer any physical complaints  Objective:  Vital signs in last 24 hours:  Temp:  [93.74 F (34.3 C)-94.64 F (34.8 C)] 94.1 F (34.5 C) (07/16 1430) Pulse Rate:  [25-92] 52 (07/16 1430) Resp:  [18-26] 22 (07/16 1430) BP: (117-171)/(42-121) 145/114 (07/16 1400) SpO2:  [96 %-100 %] 99 % (07/16 1430) Arterial Line BP: (131-182)/(46-70) 132/50 (07/16 1430) FiO2 (%):  [35 %-40 %] 35 % (07/16 1300) Weight:  [84.2 kg] 84.2 kg (07/16 0500)  Weight change: 0.9 kg Filed Weights   04/18/21 0331 04/19/21 0331 04/21/21 0500  Weight: 82 kg 83.3 kg 84.2 kg    Intake/Output: I/O last 3 completed shifts: In: 1607 [I.V.:2817.9; NG/GT:863.7; IV Piggyback:852.4] Out: 3710 [Urine:10; Drains:335; GYIRS:8546]   Intake/Output this shift:  Total I/O In: 1115.9 [I.V.:660.4; NG/GT:270; IV Piggyback:185.5] Out: 2703 [Other:1161]  Physical Exam: General: Critically ill  Head: ETT  Eyes: Anicteric  Neck: Supple  Lungs:  Scattered rhonchi  Heart: irregular  Abdomen:  +midline incision, +JP drains  Extremities: + dependent edema and in extremities  Neurologic: Intubated, sedated  Skin: No acute rash  Access: Left IJ temporary dialysis catheter    Basic Metabolic Panel: Recent Labs  Lab 04/16/21 0501 04/17/21 0150 04/17/21 1245 04/17/21 1551 04/18/21 0338 04/18/21 1554 04/19/21 0342 04/19/21 1600 April 21, 2021 0459  NA 136   < >  --    < > 135 136 136 135 137  K 4.8   < > 4.1   < > 3.7 3.1* 3.5 3.7 3.9  CL 105   < >  --    < > 104 101 104 102 105  CO2 23   < >  --    < > '25 26 26 27 27  ' GLUCOSE 123*   < >  --    < > 147* 127* 140* 142* 141*  BUN 76*   < >  --    < > 46* 35* 24* 20 18  CREATININE 1.54*   < >  --    < > 0.85 0.66 0.65 0.69 0.65  CALCIUM 8.1*   < >  --    < > 8.8* 9.7 8.5* 8.5* 8.0*  MG 2.5*  --  2.1  --  1.9  --  2.1  --  2.3   PHOS 4.4   < > 5.1*   < > 3.2 2.8 2.0* 2.0* 2.3*   < > = values in this interval not displayed.    Liver Function Tests: Recent Labs  Lab 04/15/21 0454 04/15/21 1101 04/17/21 0150 04/18/21 0338 04/18/21 1554 04/19/21 0342 04/19/21 1600 04/21/21 0459  AST 20 22  --   --   --   --   --   --   ALT 16 17  --   --   --   --   --   --   ALKPHOS 99 105  --   --   --   --   --   --   BILITOT 0.9 0.8  --   --   --   --   --   --   PROT 4.9* 5.0*  --   --   --   --   --   --   ALBUMIN 2.3*  2.3* 2.2*   < > 1.9* 2.7* 2.3* 2.7* 2.3*   < > =  values in this interval not displayed.   No results for input(s): LIPASE, AMYLASE in the last 168 hours.  No results for input(s): AMMONIA in the last 168 hours.  CBC: Recent Labs  Lab 04/15/21 0454 04/15/21 1101 04/16/21 0501 04/17/21 0150 04/18/21 0338 04/19/21 0342 04-23-21 0459  WBC 14.4*   < > 22.7* 24.3* 10.6* 9.5 7.7  NEUTROABS 13.1*  --   --   --   --   --   --   HGB 8.3*   < > 8.7* 7.5* 8.0* 8.6* 7.8*  HCT 24.0*   < > 26.4* 22.4* 24.2* 25.9* 24.2*  MCV 83.6   < > 87.1 85.8 85.5 86.9 86.1  PLT 41*   < > 78* 61* 55* 79* 72*   < > = values in this interval not displayed.    Cardiac Enzymes: No results for input(s): CKTOTAL, CKMB, CKMBINDEX, TROPONINI in the last 168 hours.  BNP: Invalid input(s): POCBNP  CBG: Recent Labs  Lab 04/19/21 1915 04/19/21 2307 04/23/2021 0321 23-Apr-2021 0735 April 23, 2021 1112  GLUCAP 106* 99 121* 118* 128*    Microbiology: Results for orders placed or performed during the hospital encounter of 05/01/2021  Resp Panel by RT-PCR (Flu A&B, Covid) Nasopharyngeal Swab     Status: None   Collection Time: 04/16/2021  4:40 AM   Specimen: Nasopharyngeal Swab; Nasopharyngeal(NP) swabs in vial transport medium  Result Value Ref Range Status   SARS Coronavirus 2 by RT PCR NEGATIVE NEGATIVE Final    Comment: (NOTE) SARS-CoV-2 target nucleic acids are NOT DETECTED.  The SARS-CoV-2 RNA is generally detectable in  upper respiratory specimens during the acute phase of infection. The lowest concentration of SARS-CoV-2 viral copies this assay can detect is 138 copies/mL. A negative result does not preclude SARS-Cov-2 infection and should not be used as the sole basis for treatment or other patient management decisions. A negative result may occur with  improper specimen collection/handling, submission of specimen other than nasopharyngeal swab, presence of viral mutation(s) within the areas targeted by this assay, and inadequate number of viral copies(<138 copies/mL). A negative result must be combined with clinical observations, patient history, and epidemiological information. The expected result is Negative.  Fact Sheet for Patients:  EntrepreneurPulse.com.au  Fact Sheet for Healthcare Providers:  IncredibleEmployment.be  This test is no t yet approved or cleared by the [place] FDA and  has been authorized for detection and/or diagnosis of SARS-CoV-2 by FDA under an Emergency Use Authorization (EUA). This EUA will remain  in effect (meaning this test can be used) for the duration of the COVID-19 declaration under Section 564(b)(1) of the Act, 21 U.S.C.section 360bbb-3(b)(1), unless the authorization is terminated  or revoked sooner.       Influenza A by PCR NEGATIVE NEGATIVE Final   Influenza B by PCR NEGATIVE NEGATIVE Final    Comment: (NOTE) The Xpert Xpress SARS-CoV-2/FLU/RSV plus assay is intended as an aid in the diagnosis of influenza from Nasopharyngeal swab specimens and should not be used as a sole basis for treatment. Nasal washings and aspirates are unacceptable for Xpert Xpress SARS-CoV-2/FLU/RSV testing.  Fact Sheet for Patients: EntrepreneurPulse.com.au  Fact Sheet for Healthcare Providers: IncredibleEmployment.be  This test is not yet approved or cleared by the [place] FDA and has been  authorized for detection and/or diagnosis of SARS-CoV-2 by FDA under an Emergency Use Authorization (EUA). This EUA will remain in effect (meaning this test can be used) for the duration of the COVID-19 declaration under  Section 564(b)(1) of the Act, 21 U.S.C. section 360bbb-3(b)(1), unless the authorization is terminated or revoked.  Performed at [facility], [address] [place], [place] [address]   Blood culture (routine x 2)     Status: None   Collection Time: 04/10/2021  5:33 AM   Specimen: BLOOD  Result Value Ref Range Status   Specimen Description BLOOD LEFT ANTECUBITAL  Final   Special Requests   Final    BOTTLES DRAWN AEROBIC AND ANAEROBIC Blood Culture adequate volume   Culture   Final    NO GROWTH 5 DAYS Performed at [facility], [address], [place], [place] [address]    Report Status 04/12/2021 FINAL  Final  Blood culture (routine x 2)     Status: None   Collection Time: 04/21/2021  7:40 AM   Specimen: BLOOD  Result Value Ref Range Status   Specimen Description BLOOD BLOOD RIGHT WRIST  Final   Special Requests   Final    BOTTLES DRAWN AEROBIC AND ANAEROBIC Blood Culture adequate volume   Culture   Final    NO GROWTH 5 DAYS Performed at [facility], [address], [place], [place] [address]    Report Status 04/12/2021 FINAL  Final  MRSA Next Gen by PCR, Nasal     Status: None   Collection Time: 04/08/2021 12:50 PM   Specimen: Nasal Mucosa; Nasal Swab  Result Value Ref Range Status   MRSA by PCR Next Gen NOT DETECTED NOT DETECTED Final    Comment: (NOTE) The GeneXpert MRSA Assay (FDA approved for NASAL specimens only), is one component of a comprehensive MRSA colonization surveillance program. It is not intended to diagnose MRSA infection nor to guide or monitor treatment for MRSA infections. Test performance is not FDA approved in patients less than 74 years old. Performed at [facility], [address],  [place], [place] [address]   Body fluid culture w Gram Stain     Status: None   Collection Time: 04/10/21  4:30 PM   Specimen: JP Drain; Body Fluid  Result Value Ref Range Status   Specimen Description   Final    JP DRAINAGE Performed at [facility], [address] [place], [place] [address]    Special Requests   Final    Normal Performed at [facility], [address] [place], [place] [address]    Gram Stain   Final    ABUNDANT WBC PRESENT, PREDOMINANTLY PMN NO ORGANISMS SEEN    Culture   Final    RARE KLEBSIELLA PNEUMONIAE CRITICAL RESULT CALLED TO, READ BACK BY AND VERIFIED WITH: RN E.JENON AT 0945 ON 04/11/2021 BY T.SAAD. Performed at [facility], [place] [address], [place], [place] [address]    Report Status 04/14/2021 FINAL  Final   Organism ID, Bacteria KLEBSIELLA PNEUMONIAE  Final      Susceptibility   Klebsiella pneumoniae - MIC*    AMPICILLIN RESISTANT Resistant     CEFAZOLIN <=4 SENSITIVE Sensitive     CEFEPIME <=0.12 SENSITIVE Sensitive     CEFTAZIDIME <=1 SENSITIVE Sensitive     CEFTRIAXONE <=0.25 SENSITIVE Sensitive     CIPROFLOXACIN <=0.25 SENSITIVE Sensitive     GENTAMICIN <=1 SENSITIVE Sensitive     IMIPENEM <=0.25 SENSITIVE Sensitive     TRIMETH/SULFA <=20 SENSITIVE Sensitive     AMPICILLIN/SULBACTAM <=2 SENSITIVE Sensitive     PIP/TAZO <=4 SENSITIVE Sensitive     * RARE KLEBSIELLA PNEUMONIAE  Aerobic/Anaerobic  Culture w Gram Stain (surgical/deep wound)     Status: None (Preliminary result)   Collection Time: 04/15/21  4:21 PM   Specimen: Abdomen; Abdominal Fluid  Result Value Ref Range Status   Specimen Description   Final    ABDOMEN Performed at [facility], [address], [place], [place] [address]    Special Requests   Final    Normal Performed at [facility], [place], [place] [address]    Gram Stain   Final    RARE WBC PRESENT,BOTH PMN AND MONONUCLEAR NO ORGANISMS SEEN     Culture   Final    NO GROWTH 4 DAYS NO ANAEROBES ISOLATED; CULTURE IN PROGRESS FOR 5 DAYS Performed at [facility], [address] [place], [place] [address]    Report Status PENDING  Incomplete  Culture, Respiratory w Gram Stain     Status: None   Collection Time: 04/16/21  8:08 AM   Specimen: Tracheal Aspirate; Respiratory  Result Value Ref Range Status   Specimen Description   Final    TRACHEAL ASPIRATE Performed at [facility], [address], [place], [place] [address]    Special Requests   Final    NONE Performed at [facility], [address] [place], [place] [address]    Gram Stain   Final    ABUNDANT WBC PRESENT,BOTH PMN AND MONONUCLEAR RARE BUDDING YEAST SEEN Performed at [facility], [place] [address] [place], [place] [address]    Culture FEW CANDIDA PARAPSILOSIS  Final   Report Status 04/19/2021 FINAL  Final    Coagulation Studies: No results for input(s): LABPROT, INR in the last 72 hours.   Urinalysis: No results for input(s): COLORURINE, LABSPEC, PHURINE, GLUCOSEU, HGBUR, BILIRUBINUR, KETONESUR, PROTEINUR, UROBILINOGEN, NITRITE, LEUKOCYTESUR in the last 72 hours.  Invalid input(s): APPERANCEUR     Imaging: No results found.   Medications:     prismasol BGK 4/2.5 500 mL/hr at 11-May-2021 1159    prismasol BGK 4/2.5 500 mL/hr at May 11, 2021 1159   sodium chloride Stopped (05-11-21 1409)   amiodarone 60 mg/hr (05-11-21 1351)   amiodarone     ampicillin-sulbactam (UNASYN) IV Stopped (2021-05-11 1342)   dextrose 50 mL/hr at May 11, 2021 1419   fentaNYL infusion INTRAVENOUS 400 mcg/hr (2021/05/11 1409)   phenylephrine (NEO-SYNEPHRINE) Adult infusion Stopped (04/18/21 1711)   prismasol BGK 4/2.5 2,000 mL/hr at 05/11/21 1313    chlorhexidine gluconate (MEDLINE KIT)  15 mL Mouth Rinse BID   Chlorhexidine Gluconate Cloth  6 each Topical Q0600   feeding supplement (PIVOT 1.5 CAL)  1,000 mL Per Tube Q24H   feeding supplement (PROSource  TF)  45 mL Per Tube TID   free water  30 mL Per Tube Q4H   insulin aspart  0-9 Units Subcutaneous Q4H   mouth rinse  15 mL Mouth Rinse 10 times per day   multivitamin  1 tablet Per Tube QHS   multivitamin  15 mL Per Tube Daily   nutrition supplement (JUVEN)  1 packet Per Tube BID BM   pantoprazole (PROTONIX) IV  40 mg Intravenous Daily   phosphorus  500 mg Per Tube BID   sodium chloride, acetaminophen **OR** acetaminophen, artificial tears, fentaNYL, metoprolol tartrate, midazolam, polyvinyl alcohol, sodium chloride  Assessment/ Plan:   Ms. Lindsay [name] is a 75 y.o. white female with hypertrophic cardiomyopathy, ventricular tachycardia status post AICD placement, atrial fibrillation on rivaroxaban, diabetes mellitus type II, hypertension, history of diverticulitis, osteopenia,  hyperlipidemia, GERD who was admitted to [facility] on 04/24/2021 for Hyponatremia [E87.1] Bowel perforation (HCC) [K63.1] Acute renal failure, unspecified acute renal failure type ([place]) [N17.9] She was found to have bowel perforation with significant pneumoperitoneum. ExLap by Dr. Celine Ahr on 7/3 .    Impression and plan  1.  Acute kidney injury with baseline normal creatinine with Metabolic acidosis and hyponatremia.-  Maintain the patient on CRRT until further disposition determined by family.  2. Bowel perforation with purulent peritoneum. Status post ExLap.   As per surgery and ID.  3.  Acute respiratory failure: continue mechanical ventilation for now but possible transition to comfort care  4. Septic shock with hypotension.   Patient is currently off vasopressors Plan is to continue vasopressors to maintain MAP of 65 or greater.  5. Anemia with renal failure: Hemoglobin up to 8.6 posttransfusion.  6. 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   - taken off argatroban due to heavy bleeding.      LOS: [place] July 22, 20222:40 PM

## 2021-05-06 NOTE — Plan of Care (Signed)
  Problem: Clinical Measurements: Goal: Respiratory complications will improve Outcome: Progressing Goal: Cardiovascular complication will be avoided Outcome: Progressing   Problem: Nutrition: Goal: Adequate nutrition will be maintained Outcome: Progressing   Problem: Pain Managment: Goal: General experience of comfort will improve Outcome: Progressing   Problem: Skin Integrity: Goal: Risk for impaired skin integrity will decrease Outcome: Progressing   Problem: Clinical Measurements: Goal: Ability to maintain clinical measurements within normal limits will improve Outcome: Not Progressing   Problem: Activity: Goal: Risk for activity intolerance will decrease Outcome: Not Progressing

## 2021-05-06 NOTE — Progress Notes (Signed)
Goals of Care  Patient's family is bedside, including the patient's spouse and niece.  Patient remains unresponsive and will not open eyes to command.  Patient with Progressive multiorgan failure with very low chance of meaningful recovery despite all aggressive and optimal medical therapy. Patient is in the Dying  Process associated with Suffering.  Family understands the situation.  They have consented and agreed to DNR/DNI and would like to proceed with Comfort care measures.    Lindsay [name], AGACNP-BC [facility]    Please see Amion for pager details.

## 2021-05-06 DEATH — deceased

## 2022-02-27 ENCOUNTER — Encounter: Payer: Medicare Other | Admitting: Dermatology

## 2022-07-14 IMAGING — CT CT ABD-PELV W/ CM
1 of 3 series · 13 of 32 positions shown, 18 images · IV contrast (APPLIED)
Comparison: None.

CLINICAL DATA: Rectal bleeding for several weeks with left lower
quadrant pain, initial encounter

EXAM:
CT ABDOMEN AND PELVIS WITH CONTRAST
TECHNIQUE: Multidetector CT imaging of the abdomen and pelvis was performed
using the standard protocol following bolus administration of
intravenous contrast.
CONTRAST:  100mL OMNIPAQUE IOHEXOL 300 MG/ML  SOLN

[Series 2: axial st · axial · 0.80mm/px · z∈[-1086,-676]mm · 13 of 92 slices shown, 18 images]
[im 5/92  soft-tissue]
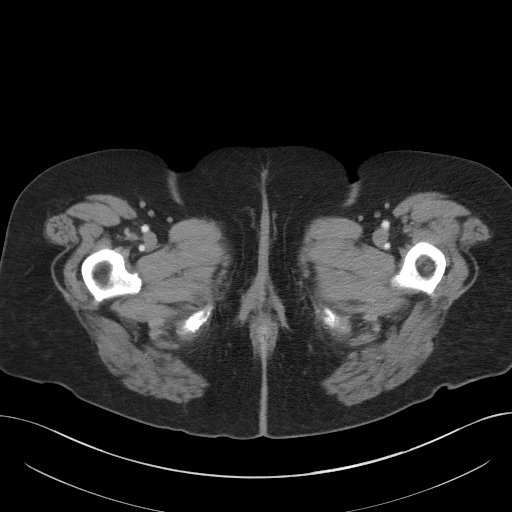
[im 5/92  bone]
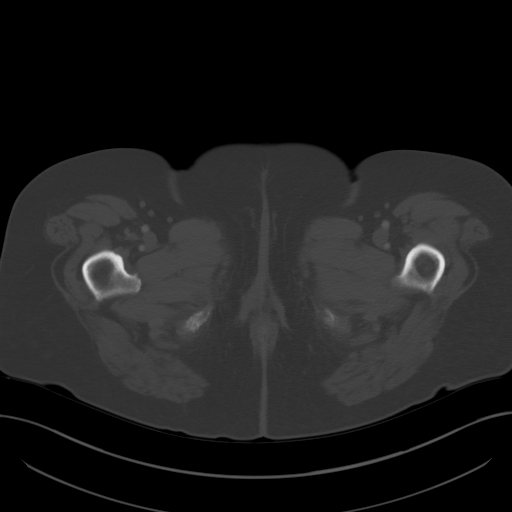
[im 15/92  soft-tissue]
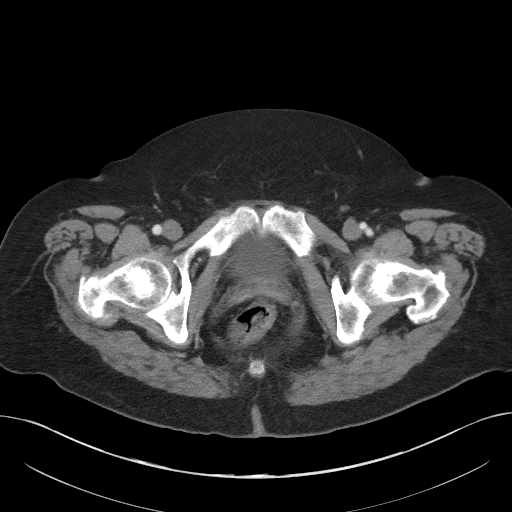
[im 20/92  soft-tissue]
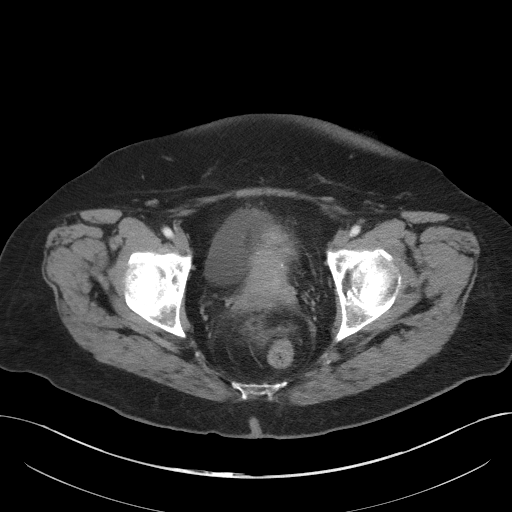
[im 29/92  soft-tissue]
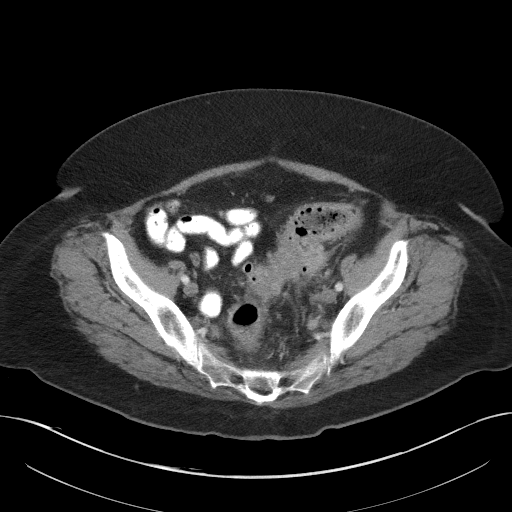
[im 34/92  soft-tissue]
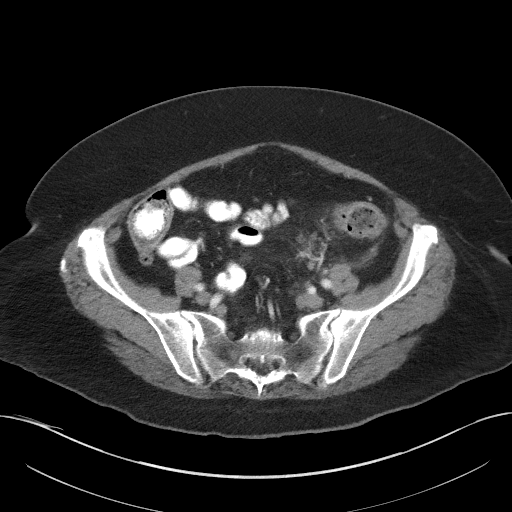
[im 44/92  soft-tissue]
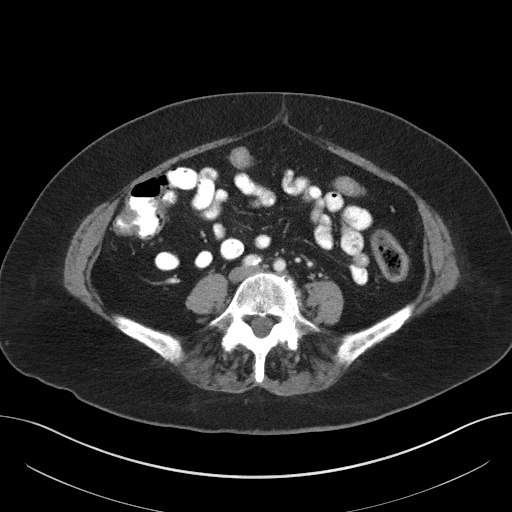
[im 48/92  soft-tissue]
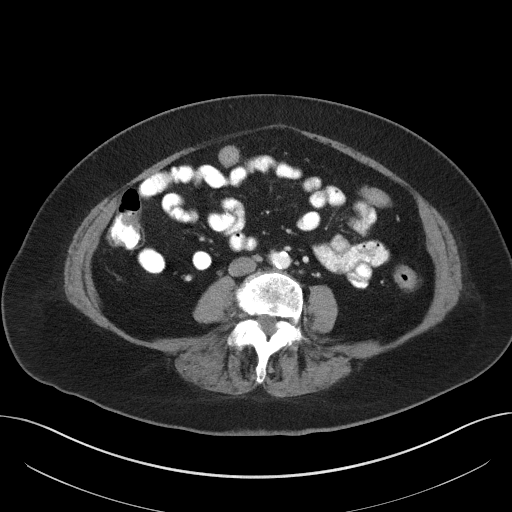
[im 58/92  soft-tissue]
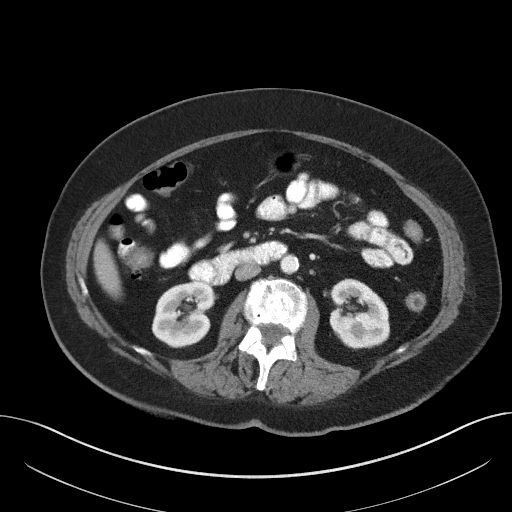
[im 63/92  soft-tissue]
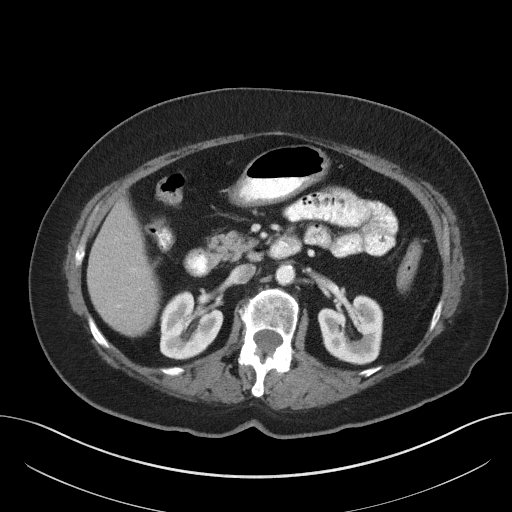
[im 63/92  bone]
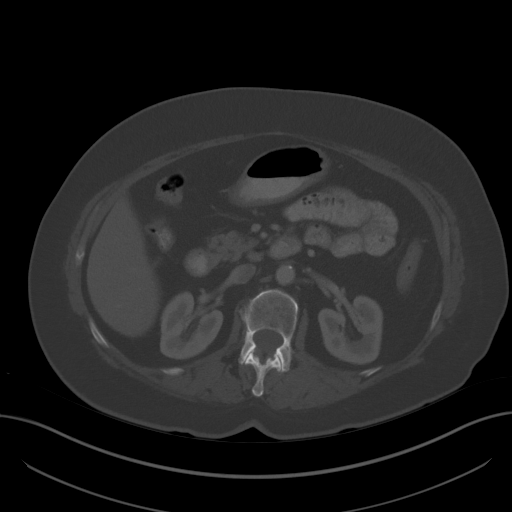
[im 72/92  soft-tissue]
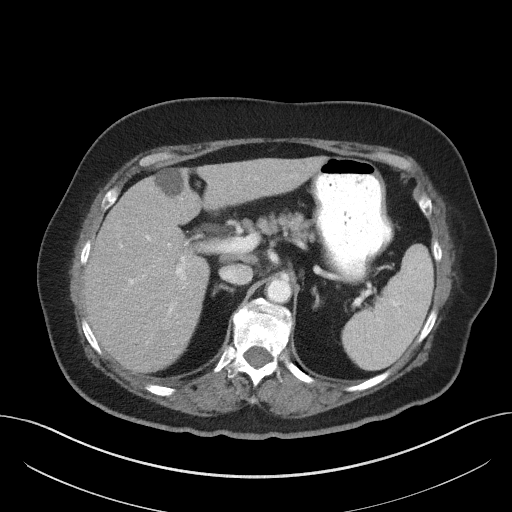
[im 72/92  lung]
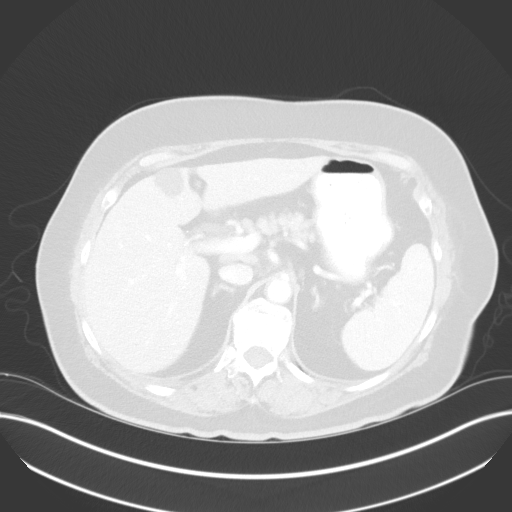
[im 77/92  soft-tissue]
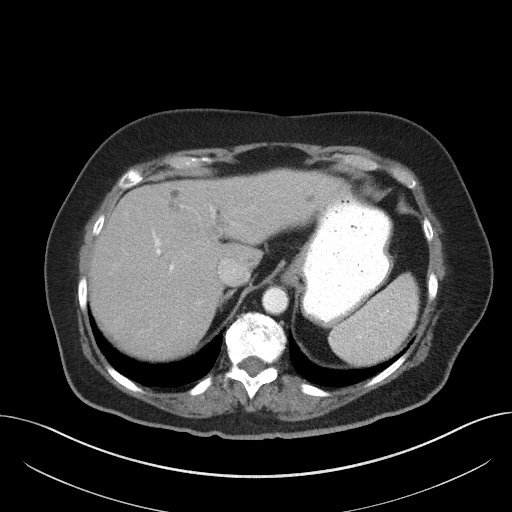
[im 77/92  lung]
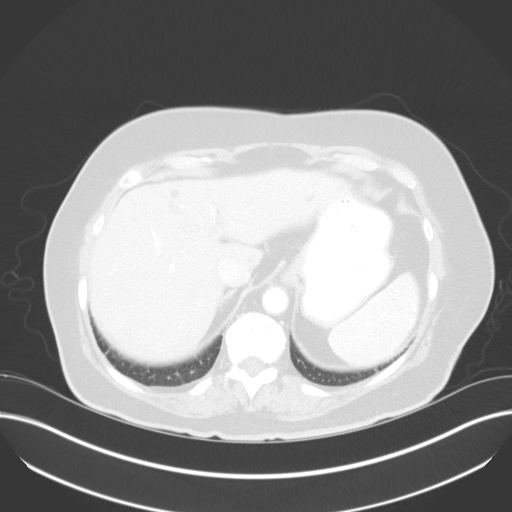
[im 82/92  lung]
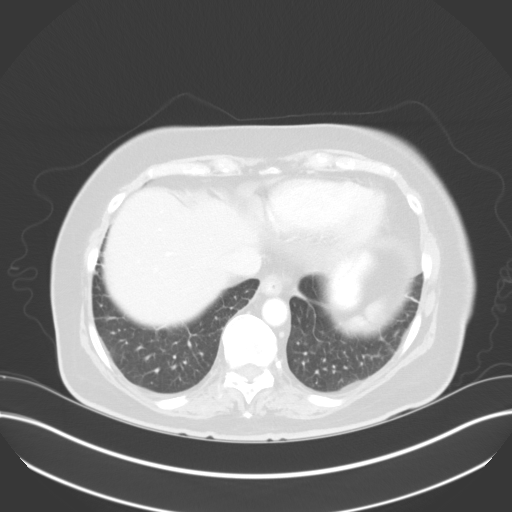
[im 87/92  soft-tissue]
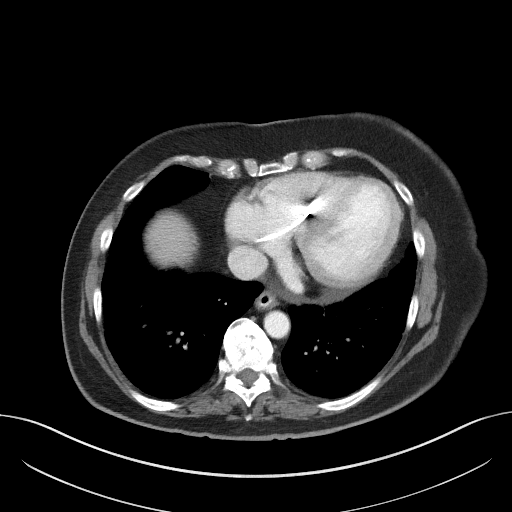
[im 87/92  lung]
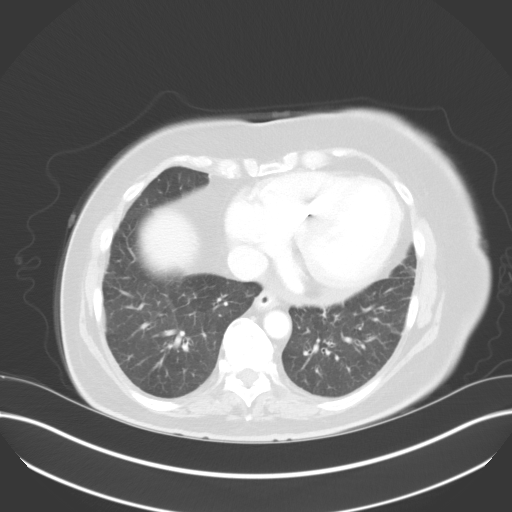

[13 of 32 positions shown; findings below may reference images not displayed]

FINDINGS: Lower chest: No acute abnormality. Heart is enlarged in size.
Defibrillator is seen.

Hepatobiliary: Scattered hypodensities are noted throughout the
liver consistent with small cysts. No mass lesion is seen. The
gallbladder has been surgically removed.

Pancreas: Unremarkable. No pancreatic ductal dilatation or
surrounding inflammatory changes.

Spleen: Normal in size without focal abnormality.

Adrenals/Urinary Tract: Adrenal glands are within normal limits.
Kidneys demonstrate a normal enhancement pattern and normal delayed
excretion. No renal calculi or obstructive changes are seen. Bladder
is partially distended.

Stomach/Bowel: There is significant wall thickening identified in
the sigmoid colon with pericolonic inflammatory change identified
consistent with diverticulitis. This is contiguous with what appears
to be the left ovary. Mild intramural inflammatory change in the
sigmoid is likely present as well. No definitive perforation or
sizable abscess is seen. Dilated diverticula are noted. The more
proximal colon shows decompression. The appendix is within normal
limits. No small bowel or gastric abnormality is noted.

Vascular/Lymphatic: Aortic atherosclerosis. No enlarged abdominal or
pelvic lymph nodes.

Reproductive: Uterus and right adnexa are within normal limits. The
left adnexa appears involved in the diverticular change.

Other: No abdominal wall hernia or abnormality. No abdominopelvic
ascites.

Musculoskeletal: No acute or significant osseous findings.
IMPRESSION: Changes consistent with significant diverticulitis involving the
sigmoid colon. Some localized inflammatory involvement of the left
adnexa is suggested as well. No drainable abscess is noted at this
time. Given the history of rectal bleeding short-term follow-up
following appropriate therapy is recommended to exclude underlying
mass lesion. This could also be performed via colonoscopy following
resolution of the inflammatory change.

Chronic changes as described above.

## 2023-06-25 IMAGING — CT CT IMAGE GUIDED DRAINAGE BY PERCUTANEOUS CATHETER
1 of 7 series · 13 of 32 positions shown, 18 images · IV contrast (APPLIED)
Comparison: none

INDICATION: 74-year-old female status post perforated colon status post
exploratory laparotomy. Residual abdominal fluid resembling ascites
with possible mild loculation seen on CT imaging. Drain placement
requested.

[Series 4: thins · axial · 0.86mm/px · z∈[-118,-23]mm · 13 of 156 slices shown, 18 images]
[im 10/156  soft-tissue]
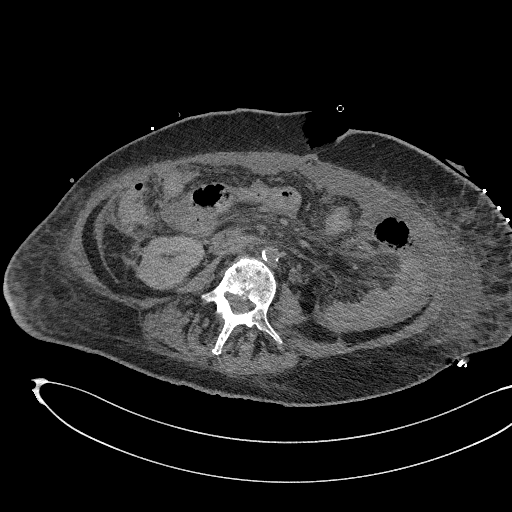
[im 10/156  bone]
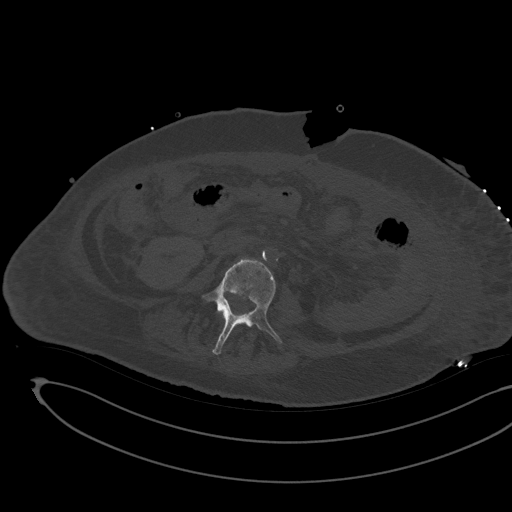
[im 20/156  soft-tissue]
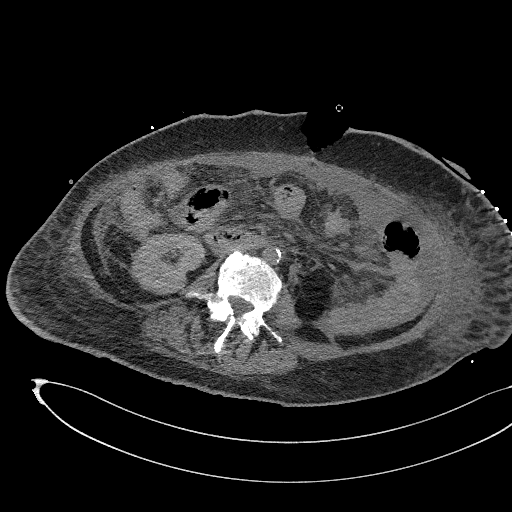
[im 39/156  soft-tissue]
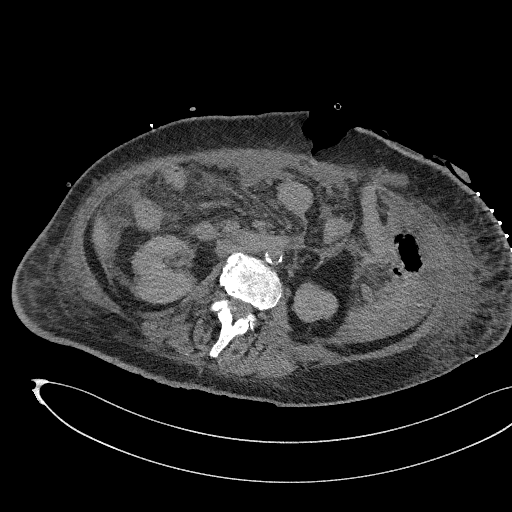
[im 49/156  soft-tissue]
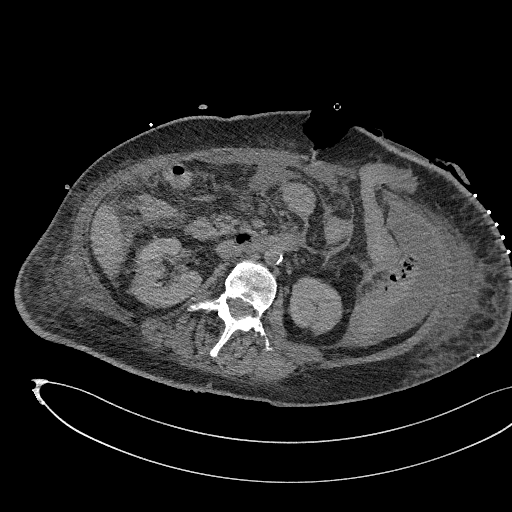
[im 59/156  soft-tissue]
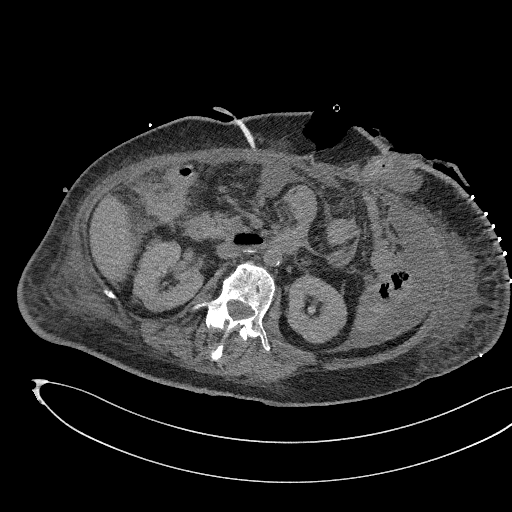
[im 68/156  soft-tissue]
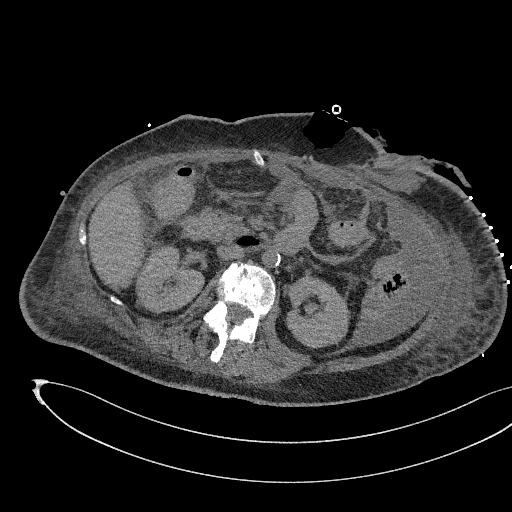
[im 88/156  soft-tissue]
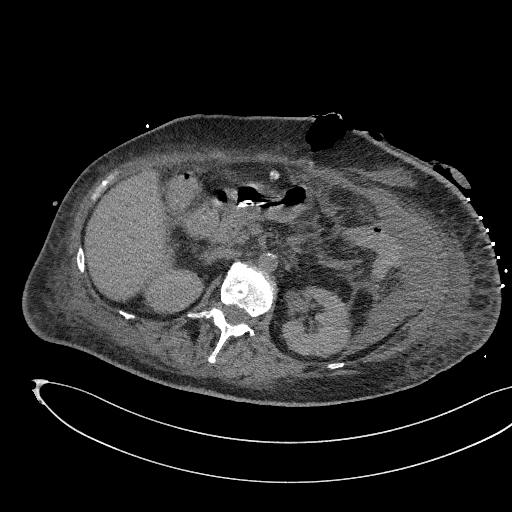
[im 97/156  soft-tissue]
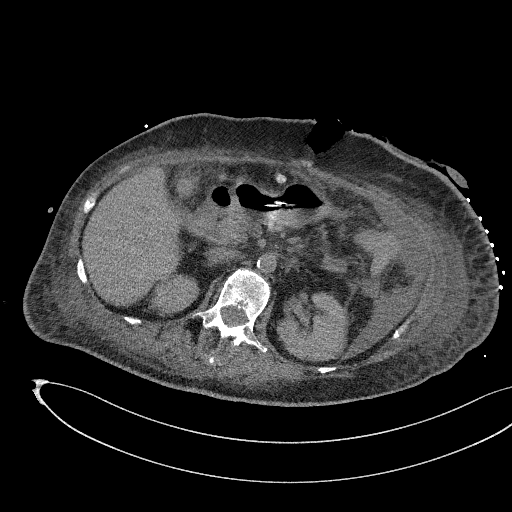
[im 107/156  soft-tissue]
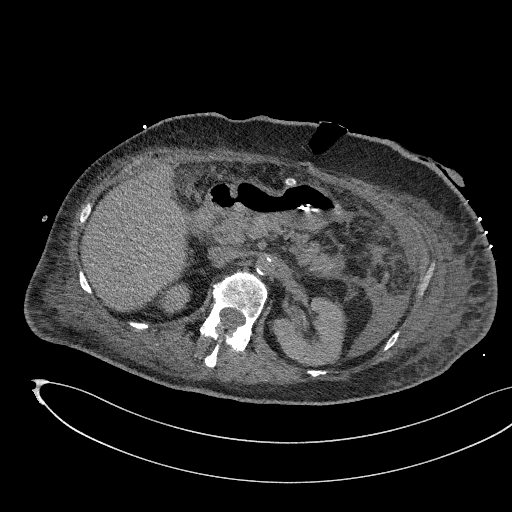
[im 107/156  bone]
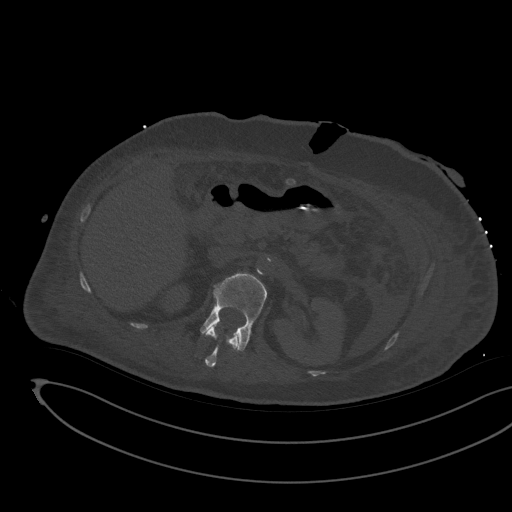
[im 117/156  soft-tissue]
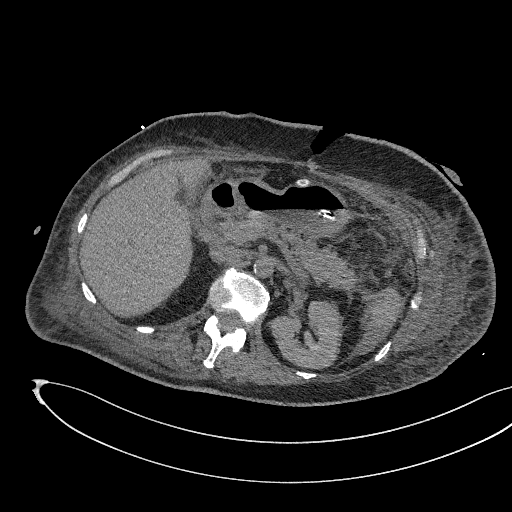
[im 117/156  lung]
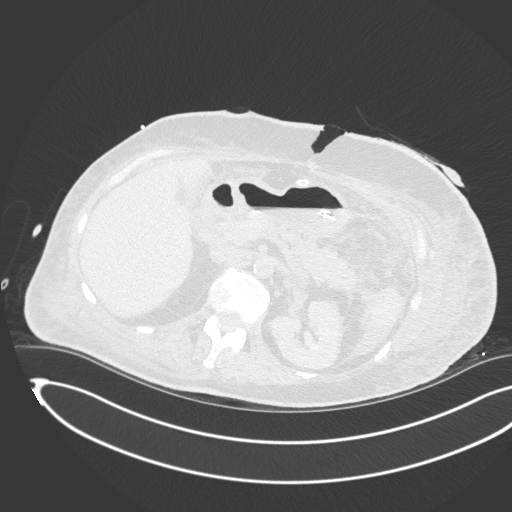
[im 126/156  lung]
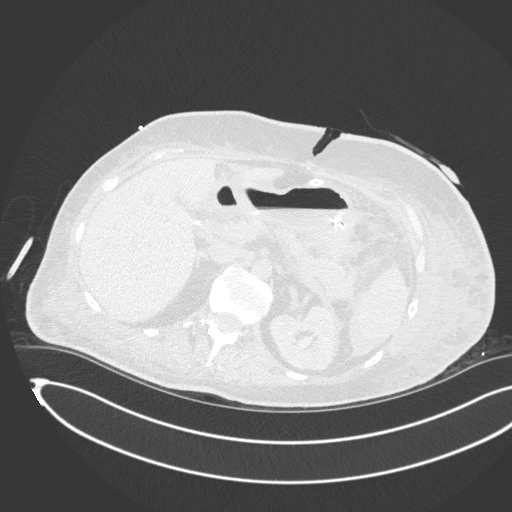
[im 136/156  soft-tissue]
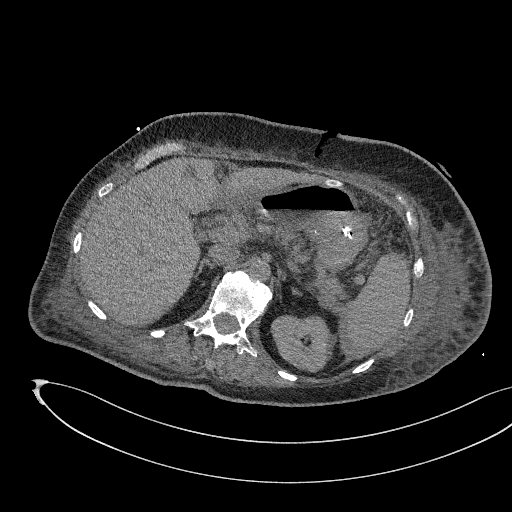
[im 136/156  lung]
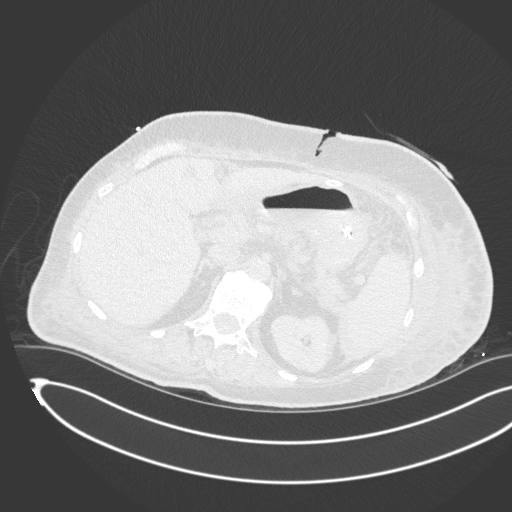
[im 146/156  soft-tissue]
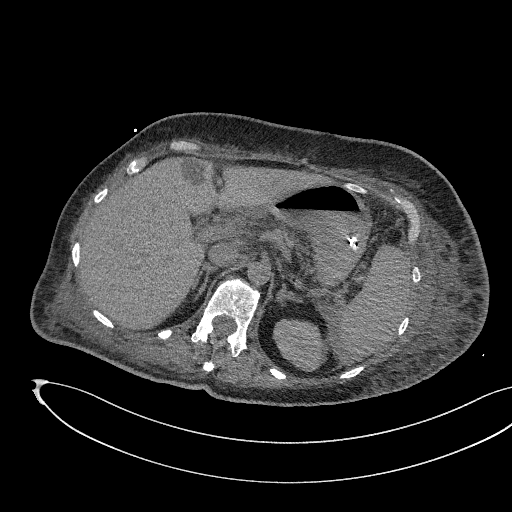
[im 146/156  lung]
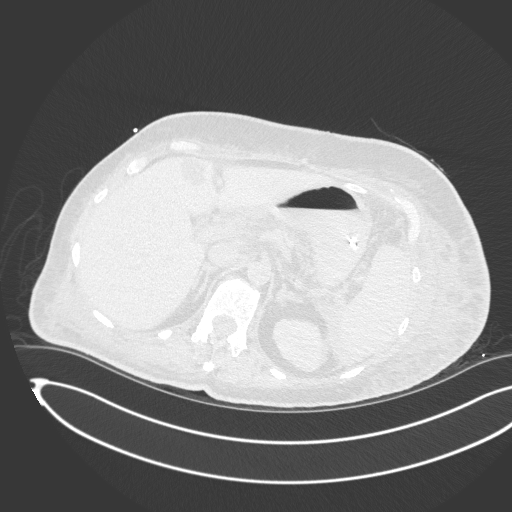

[13 of 32 positions shown; findings below may reference images not displayed]

EXAM:
CT-guided drain placement

MEDICATIONS:
The patient is currently admitted to the hospital and receiving
intravenous antibiotics. The antibiotics were administered within an
appropriate time frame prior to the initiation of the procedure.

ANESTHESIA/SEDATION:
None.

COMPLICATIONS:
None immediate.

PROCEDURE:
Informed written consent was obtained from the patient after a
thorough discussion of the procedural risks, benefits and
alternatives. All questions were addressed. Maximal Sterile Barrier
Technique was utilized including caps, mask, sterile gowns, sterile
gloves, sterile drape, hand hygiene and skin antiseptic. A timeout
was performed prior to the initiation of the procedure.

A planning axial CT scan was performed. The fluid layering along the
left pericolic gutter was localized. The skin was marked. The
overlying skin was then sterilely prepped and draped in standard
fashion using chlorhexidine skin prep. Local anesthesia was attained
by infiltration with 1% lidocaine. A small dermatotomy was made.
Under intermittent CT guidance, an 18 gauge trocar needle was
advanced into the fluid. A 0.035 wire was then advanced into the
fluid. The needle was removed. The subcutaneous tract was dilated to
10 French. Balta Mayen 10 French all-purpose drainage catheter was
advanced over the wire and formed in the fluid. Aspiration yields
mildly turbid serosanguineous ascites.

Several 100 cc of fluid was aspirated. Follow-up CT imaging
demonstrates a well-positioned drainage catheter in the expected
location. The catheter was secured to the skin with 0 Prolene suture
and connected to JP bulb suction.
IMPRESSION: Successful placement of 10 French drainage catheter.

Evacuation fluid is serosanguineous and mildly turbid consistent
with ascites. Samples were sent for culture.

## 2023-06-26 IMAGING — DX DG CHEST 1V PORT
1 series · 1 of 1 positions shown · non-contrast
Comparison: 04/15/2021

CLINICAL DATA: Follow-up pneumonia.  Ventilator support.

EXAM:
PORTABLE CHEST 1 VIEW

[chest ap]
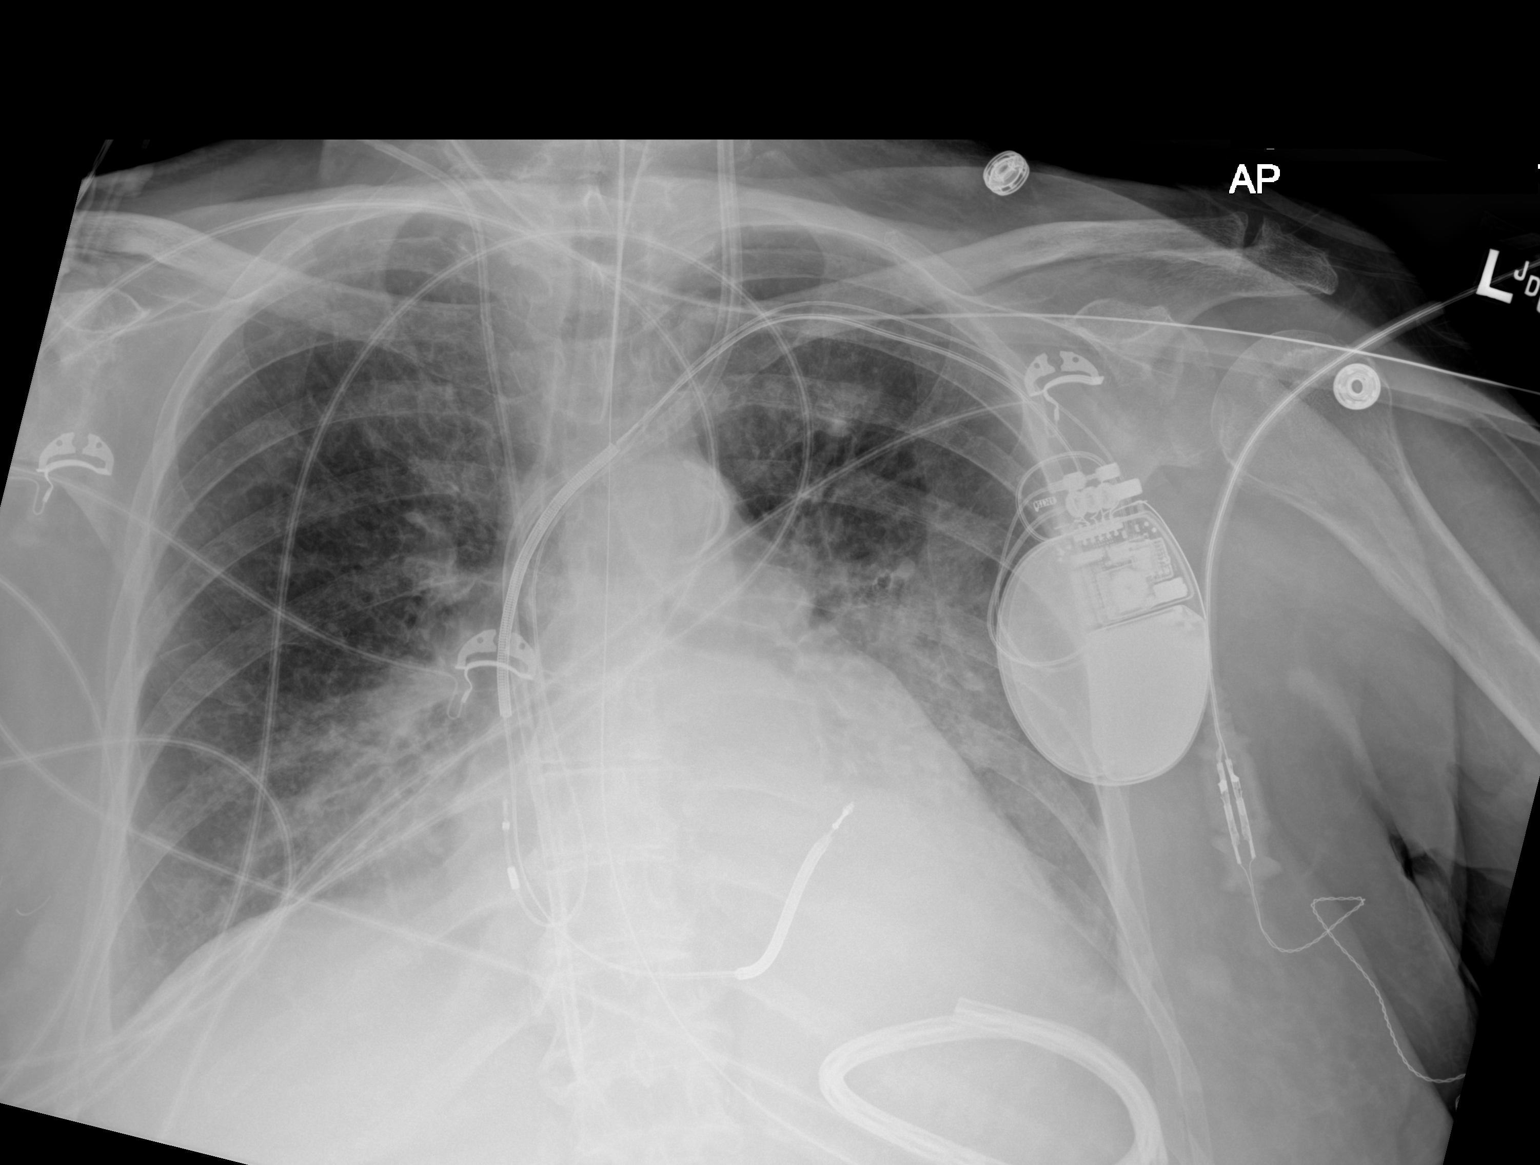

[1 of 1 positions shown; findings below may reference images not displayed]

FINDINGS: Endotracheal tube tip 3.5 cm above the carina. Orogastric or
nasogastric tube enters the stomach. Right internal jugular central
line tip in the SVC probably extends through the right atrium to the
upper IVC. Left internal jugular central line tip extends through
the right atrium to the upper IVC. Pacemaker/AICD appears unchanged.
Persistent infiltrate and volume loss in the mid and lower lungs,
left worse than right. No worsening or new finding.
IMPRESSION: Persistent infiltrate and volume loss in the mid and lower lungs,
left worse than right.

Lines and tubes appear similar. Left internal jugular central line
and right internal jugular central line extend through the right
atrium to the upper IVC.
